# Patient Record
Sex: Female | Born: 1935 | Race: White | Hispanic: No | Smoking: Never smoker
Health system: Southern US, Community
[De-identification: ages and names within clinical notes are randomized; demographics above are authoritative.]

## PROBLEM LIST (undated history)

## (undated) DIAGNOSIS — S82891A Other fracture of right lower leg, initial encounter for closed fracture: Secondary | ICD-10-CM

## (undated) DIAGNOSIS — G20 Parkinson's disease: Secondary | ICD-10-CM

## (undated) DIAGNOSIS — G20A1 Parkinson's disease without dyskinesia, without mention of fluctuations: Secondary | ICD-10-CM

## (undated) DIAGNOSIS — E538 Deficiency of other specified B group vitamins: Secondary | ICD-10-CM

## (undated) DIAGNOSIS — M545 Low back pain, unspecified: Secondary | ICD-10-CM

## (undated) DIAGNOSIS — I10 Essential (primary) hypertension: Secondary | ICD-10-CM

## (undated) DIAGNOSIS — I4891 Unspecified atrial fibrillation: Secondary | ICD-10-CM

## (undated) DIAGNOSIS — E119 Type 2 diabetes mellitus without complications: Secondary | ICD-10-CM

## (undated) DIAGNOSIS — E785 Hyperlipidemia, unspecified: Secondary | ICD-10-CM

## (undated) DIAGNOSIS — L03115 Cellulitis of right lower limb: Secondary | ICD-10-CM

## (undated) HISTORY — DX: Essential (primary) hypertension: I10

## (undated) HISTORY — DX: Parkinson's disease without dyskinesia, without mention of fluctuations: G20.A1

## (undated) HISTORY — DX: Low back pain: M54.5

## (undated) HISTORY — DX: Cellulitis of right lower limb: L03.115

## (undated) HISTORY — DX: Unspecified atrial fibrillation: I48.91

## (undated) HISTORY — DX: Other fracture of right lower leg, initial encounter for closed fracture: S82.891A

## (undated) HISTORY — DX: Hyperlipidemia, unspecified: E78.5

## (undated) HISTORY — DX: Low back pain, unspecified: M54.50

## (undated) HISTORY — PX: PACEMAKER INSERTION: SHX728

## (undated) HISTORY — DX: Deficiency of other specified B group vitamins: E53.8

## (undated) HISTORY — DX: Parkinson's disease: G20

## (undated) HISTORY — DX: Morbid (severe) obesity due to excess calories: E66.01

## (undated) HISTORY — DX: Type 2 diabetes mellitus without complications: E11.9

---

## 1970-09-12 DIAGNOSIS — S82891A Other fracture of right lower leg, initial encounter for closed fracture: Secondary | ICD-10-CM

## 1970-09-12 DIAGNOSIS — E119 Type 2 diabetes mellitus without complications: Secondary | ICD-10-CM

## 1970-09-12 HISTORY — DX: Type 2 diabetes mellitus without complications: E11.9

## 1970-09-12 HISTORY — DX: Other fracture of right lower leg, initial encounter for closed fracture: S82.891A

## 1983-09-13 HISTORY — PX: CHOLECYSTECTOMY: SHX55

## 1998-04-10 ENCOUNTER — Other Ambulatory Visit: Admission: RE | Admit: 1998-04-10 | Discharge: 1998-04-10 | Payer: Self-pay | Admitting: Cardiology

## 2002-01-15 ENCOUNTER — Encounter: Admission: RE | Admit: 2002-01-15 | Discharge: 2002-01-15 | Payer: Self-pay | Admitting: Internal Medicine

## 2002-01-15 ENCOUNTER — Encounter: Payer: Self-pay | Admitting: Internal Medicine

## 2004-02-02 ENCOUNTER — Encounter: Admission: RE | Admit: 2004-02-02 | Discharge: 2004-02-02 | Payer: Self-pay | Admitting: Internal Medicine

## 2004-07-29 ENCOUNTER — Encounter: Admission: RE | Admit: 2004-07-29 | Discharge: 2004-07-29 | Payer: Self-pay | Admitting: Internal Medicine

## 2006-04-25 ENCOUNTER — Encounter: Admission: RE | Admit: 2006-04-25 | Discharge: 2006-04-25 | Payer: Self-pay | Admitting: Internal Medicine

## 2007-06-30 ENCOUNTER — Inpatient Hospital Stay (HOSPITAL_COMMUNITY): Admission: EM | Admit: 2007-06-30 | Discharge: 2007-07-02 | Payer: Self-pay | Admitting: Emergency Medicine

## 2008-03-19 ENCOUNTER — Encounter: Admission: RE | Admit: 2008-03-19 | Discharge: 2008-03-19 | Payer: Self-pay | Admitting: Internal Medicine

## 2009-03-27 ENCOUNTER — Encounter: Admission: RE | Admit: 2009-03-27 | Discharge: 2009-03-27 | Payer: Self-pay | Admitting: Internal Medicine

## 2009-07-31 ENCOUNTER — Inpatient Hospital Stay (HOSPITAL_COMMUNITY): Admission: EM | Admit: 2009-07-31 | Discharge: 2009-08-04 | Payer: Self-pay | Admitting: Emergency Medicine

## 2010-06-26 ENCOUNTER — Encounter: Payer: Self-pay | Admitting: Internal Medicine

## 2010-08-31 DIAGNOSIS — N189 Chronic kidney disease, unspecified: Secondary | ICD-10-CM

## 2010-08-31 DIAGNOSIS — I4891 Unspecified atrial fibrillation: Secondary | ICD-10-CM | POA: Insufficient documentation

## 2010-08-31 DIAGNOSIS — E119 Type 2 diabetes mellitus without complications: Secondary | ICD-10-CM

## 2010-08-31 DIAGNOSIS — I10 Essential (primary) hypertension: Secondary | ICD-10-CM

## 2010-08-31 DIAGNOSIS — E782 Mixed hyperlipidemia: Secondary | ICD-10-CM | POA: Insufficient documentation

## 2010-09-01 ENCOUNTER — Ambulatory Visit: Payer: Self-pay | Admitting: Internal Medicine

## 2010-09-01 DIAGNOSIS — Z950 Presence of cardiac pacemaker: Secondary | ICD-10-CM

## 2010-09-01 DIAGNOSIS — I4892 Unspecified atrial flutter: Secondary | ICD-10-CM | POA: Insufficient documentation

## 2010-09-01 DIAGNOSIS — I5032 Chronic diastolic (congestive) heart failure: Secondary | ICD-10-CM

## 2010-10-12 NOTE — Miscellaneous (Signed)
Summary: Device preload  Clinical Lists Changes  Observations: Added new observation of PPM INDICATN: A-fib  (06/26/2010 15:14) Added new observation of MAGNET RTE: BOL 85 ERI 65 (06/26/2010 15:14) Added new observation of PPMLEADSTAT2: active (06/26/2010 15:14) Added new observation of PPMLEADSER2: [id number] (06/26/2010 15:14) Added new observation of PPMLEADMOD2: [id number]  (06/26/2010 15:14) Added new observation of PPMLEADDOI2: 07/31/2009  (06/26/2010 15:14) Added new observation of PPMLEADLOC2: RV  (06/26/2010 15:14) Added new observation of PPMLEADSTAT1: active  (06/26/2010 15:14) Added new observation of PPMLEADSER1: [id number]  (06/26/2010 15:14) Added new observation of PPMLEADMOD1: 5076  (06/26/2010 15:14) Added new observation of PPMLEADDOI1: 07/31/2009  (06/26/2010 15:14) Added new observation of PPMLEADLOC1: RA  (06/26/2010 15:14) Added new observation of PPM IMP MD: Duffy Rhody Tennant,MD  (06/26/2010 15:14) Added new observation of PPM DOI: 07/31/2009  (06/26/2010 15:14) Added new observation of PPM SERL#: [id number]  (06/26/2010 15:14) Added new observation of PPM MODL#: [id number]  (06/26/2010 32:44) Added new observation of PACEMAKERMFG: Medtronic  (06/26/2010 15:14) Added new observation of PPM REFER MD: Chip Boer  (06/26/2010 15:14) Added new observation of PACEMAKER MD: Lewayne Bunting, MD  (06/26/2010 15:14)      PPM Specifications Following MD:  Lewayne Bunting, MD     Referring MD:  Chip Boer PPM Vendor:  Medtronic     PPM Model Number:  ADDRL1     PPM Serial Number:  [id number] PPM DOI:  07/31/2009     PPM Implanting MD:  Rolla Plate  Lead 1    Location: RA     DOI: 07/31/2009     Model #: [id number]     Serial #: [id number]     Status: active Lead 2    Location: RV     DOI: 07/31/2009     Model #: [id number]     Serial #: [id number]     Status: active  Magnet Response Rate:  BOL 85 ERI 65  Indications:  A-fib

## 2010-10-14 NOTE — Cardiovascular Report (Signed)
Summary: Office Visit   Office Visit   Imported By: Roderic Ovens 09/08/2010 11:22:44  _____________________________________________________________________  External Attachment:    Type:   Image     Comment:   External Document

## 2010-10-14 NOTE — Assessment & Plan Note (Signed)
Summary: pacer check/medtronic   Visit Type:  Initial Consult   History of Present Illness: Leah [name] is referred today for ongoing PPM evaluation and treatment along with treatment of recurrent atrial arrhythmias.  She is a pleasant obese woman with a h/o HTN, DM, tachy-brady syndrome and morbid obesity.  She is s/p PPM just over a year ago.  She denies c/p  but does occaisionally experience sob described as a smothering sensation.  She has minimal palpitations. No syncope. Minimal peripheral edema.   Current Medications (verified): 1)  Atenolol 25 Mg Tabs (Atenolol) .... Take 3  Tablets By Mouth Daily 2)  Hydrochlorothiazide 25 Mg Tabs (Hydrochlorothiazide) .... Take One Tablet By Mouth Daily. 3)  Glimepiride 4 Mg Tabs (Glimepiride) .... Once Daily 4)  Metformin Hcl 1000 Mg Tabs (Metformin Hcl) .... 2 Tabs Daily 5)  Hydrocodone-Acetaminophen 5-500 Mg Tabs (Hydrocodone-Acetaminophen) .... Uad 6)  Lipitor 10 Mg Tabs (Atorvastatin Calcium) .... Take One Tablet By Mouth Daily. 7)  Warfarin Sodium 5 Mg Tabs (Warfarin Sodium) .... Use As Directed By Anticoagulation Clinic 8)  Cephalexin 250 Mg Caps (Cephalexin) .... Once Daily 9)  Lisinopril 10 Mg Tabs (Lisinopril) .... Take One Tablet By Mouth Daily 10)  Digoxin 0.125 Mg Tabs (Digoxin) .... Take A Half  Tablet By Mouth Daily 11)  Fish Oil   Oil (Fish Oil) .... Uad 12)  Vitamin B-12   Tabs (Cyanocobalamin) .... Daily 13)  Vitamin D 1000 Unit  Tabs (Cholecalciferol) .... Daily 14)  Lantus 100 Unit/ml Soln (Insulin Glargine) .... Uad  Past History:  Past Medical History: Last updated: 08/31/2010  1. Atrial fibrillation with rapid ventricular response as well as       severe pauses with asystole lasting 4 seconds.  This required       implantation of a permanent pacemaker this admission.   2. Chronic anticoagulation.   3. Acute on chronic renal insufficiency.   4. Type 2 diabetes.   5. Obesity.   6. Hypertension.   7.  Hyperlipidemia.   8. Osteoporosis.   9. She did have a urinary tract infection as well.      Past Surgical History: Last updated: 08/31/2010  Medtronic dual-chamber bipolar pacemaker  Social History: Last updated: 08/31/2010  Notable for her being a widow since 2007.  She lives   alone.  No tobacco.  No alcohol.  No drug use.   Review of Systems       All systems reviewed and negative except as noted in the HPI and some arthritis.  Vital Signs:  Patient profile:   76 year old female Weight:      256 pounds Pulse rate:   72 / minute BP sitting:   124 / 78  (left arm)  Vitals Entered By: Laurance Flatten CMA (September 01, 2010 4:44 PM)  Physical Exam  General:  Obese, well developed, well nourished, in no acute distress.  HEENT: normal Neck: supple. No JVD. Carotids 2+ bilaterally no bruits Cor: Regular tachy with  no rubs, gallops or murmur Lungs: CTA except for minimal basilar rales. Ab: soft, nontender. nondistended. No HSM. Good bowel sounds Ext: warm. no cyanosis or clubbing with minimal peripheral  edema Neuro: alert and oriented. Grossly nonfocal. affect pleasant    PPM Specifications Following MD:  Lewayne Bunting, MD     Referring MD:  Chip Boer PPM Vendor:  Medtronic     PPM Model Number:  ADDRL1     PPM Serial Number:  [id number] PPM DOI:  07/31/2009     PPM Implanting MD:  Rolla Plate  Lead 1    Location: RA     DOI: 07/31/2009     Model #: 1610     Serial #: [id number]     Status: active Lead 2    Location: RV     DOI: 07/31/2009     Model #: 1914     Serial #: [id number]     Status: active  Magnet Response Rate:  BOL 85 ERI 65  Indications:  A-fib   MD Comments:  Underlying atrial rhythm is flutter with an RVR/CVR.  Impression & Recommendations:  Problem # 1:  ATRIAL FLUTTER (ICD-427.32) Today the patient is in atrial flutter.  Her ventricular rate is slightly elevated.  She appears minimally symptomatic.  I have asked her to continue her  current meds and I will discuss additional medical therapy with Dr. Elease Hashimoto. Her updated medication list for this problem includes:    Atenolol 25 Mg Tabs (Atenolol) .Marland Kitchen... Take 3  tablets by mouth daily    Warfarin Sodium 5 Mg Tabs (Warfarin sodium) ..... Use as directed by anticoagulation clinic    Digoxin 0.125 Mg Tabs (Digoxin) .Marland Kitchen... Take a half  tablet by mouth daily  Problem # 2:  ATRIAL FIBRILLATION (ICD-427.31) She has also had some atrial fibrillation.  Will discuss additional medical therapy. I wonder if flecainide would be a consideration. Her updated medication list for this problem includes:    Atenolol 25 Mg Tabs (Atenolol) .Marland Kitchen... Take 3  tablets by mouth daily    Warfarin Sodium 5 Mg Tabs (Warfarin sodium) ..... Use as directed by anticoagulation clinic    Digoxin 0.125 Mg Tabs (Digoxin) .Marland Kitchen... Take a half  tablet by mouth daily  Problem # 3:  CARDIAC PACEMAKER IN SITU (ICD-V45.01) Her device is working normally.  Will follow.  Problem # 4:  CHRONIC DIASTOLIC HEART FAILURE (ICD-428.32) Her symptoms appear to be class 2. I have encouraged her to maintain a low sodium diet and meds as below. Her updated medication list for this problem includes:    Atenolol 25 Mg Tabs (Atenolol) .Marland Kitchen... Take 3  tablets by mouth daily    Hydrochlorothiazide 25 Mg Tabs (Hydrochlorothiazide) .Marland Kitchen... Take one tablet by mouth daily.    Warfarin Sodium 5 Mg Tabs (Warfarin sodium) ..... Use as directed by anticoagulation clinic    Lisinopril 10 Mg Tabs (Lisinopril) .Marland Kitchen... Take one tablet by mouth daily    Digoxin 0.125 Mg Tabs (Digoxin) .Marland Kitchen... Take a half  tablet by mouth daily

## 2010-10-14 NOTE — Procedures (Signed)
Summary: Cardiology Device Clinic   [facility] Specifications Following MD:  Leah Bunting, MD     Referring MD:  Chip Boer PPM Vendor:  Medtronic     PPM Model Number:  ADDRL1     PPM Serial Number:  [id number] PPM DOI:  07/31/2009     PPM Implanting MD:  Rolla Plate  Lead 1    Location: RA     DOI: 07/31/2009     Model #: 4098     Serial #: [id number]     Status: active Lead 2    Location: RV     DOI: 07/31/2009     Model #: 6213     Serial #: [id number]     Status: active  Magnet Response Rate:  BOL 85 ERI 65  Indications:  A-fib    PPM Follow Up Remote Check?  No Battery Voltage:  2.79 V     Battery Est. Longevity:  12 years     Pacer Dependent:  No       PPM Device Measurements Atrium  Amplitude: 5.6 mV, Impedance: 523 ohms,  Right Ventricle  Amplitude: 11.20 mV, Impedance: 678 ohms, Threshold: 0.375 V at 0.4 msec  Episodes MS Episodes:  13463     Percent Mode Switch:  67.4%     Coumadin:  Yes Atrial Pacing:  35.6%     Ventricular Pacing:  67.4%  Parameters Mode:  DDDR+     Lower Rate Limit:  60     Upper Rate Limit:  130 Paced AV Delay:  150     Sensed AV Delay:  120 Next Remote Date:  12/02/2010     Next Cardiology Appt Due:  08/13/2011 Tech Comments:  No parameter changes.  A-flutter with RVR, + coumadin.  [facility] transmissions every 3 months.  ROV 1 year with Dr. Ladona Ridgel. Altha Harm, LPN  September 02, 2010 7:59 AM

## 2010-12-02 ENCOUNTER — Encounter: Payer: Self-pay | Admitting: *Deleted

## 2010-12-03 ENCOUNTER — Encounter: Payer: Self-pay | Admitting: *Deleted

## 2010-12-06 ENCOUNTER — Encounter: Payer: Self-pay | Admitting: Internal Medicine

## 2010-12-15 LAB — BASIC METABOLIC PANEL
BUN: 31 mg/dL — ABNORMAL HIGH (ref 6–23)
BUN: 43 mg/dL — ABNORMAL HIGH (ref 6–23)
BUN: 55 mg/dL — ABNORMAL HIGH (ref 6–23)
CO2: 23 mEq/L (ref 19–32)
CO2: 24 mEq/L (ref 19–32)
Calcium: 10.1 mg/dL (ref 8.4–10.5)
Calcium: 9.1 mg/dL (ref 8.4–10.5)
Calcium: 9.6 mg/dL (ref 8.4–10.5)
Chloride: 103 mEq/L (ref 96–112)
Chloride: 103 mEq/L (ref 96–112)
Chloride: 97 mEq/L (ref 96–112)
Creatinine, Ser: 2.05 mg/dL — ABNORMAL HIGH (ref 0.4–1.2)
GFR calc Af Amer: 29 mL/min — ABNORMAL LOW (ref >60)
GFR calc Af Amer: 56 mL/min — ABNORMAL LOW (ref >60)
GFR calc non Af Amer: 20 mL/min — ABNORMAL LOW (ref >60)
GFR calc non Af Amer: 20 mL/min — ABNORMAL LOW (ref >60)
GFR calc non Af Amer: 24 mL/min — ABNORMAL LOW (ref >60)
GFR calc non Af Amer: 42 mL/min — ABNORMAL LOW (ref >60)
Glucose, Bld: 132 mg/dL — ABNORMAL HIGH (ref 70–99)
Potassium: 3.7 mEq/L (ref 3.5–5.1)
Potassium: 4 mEq/L (ref 3.5–5.1)
Potassium: 4 mEq/L (ref 3.5–5.1)
Potassium: 4.1 mEq/L (ref 3.5–5.1)
Sodium: 131 mEq/L — ABNORMAL LOW (ref 135–145)
Sodium: 133 mEq/L — ABNORMAL LOW (ref 135–145)
Sodium: 137 mEq/L (ref 135–145)
Sodium: 138 mEq/L (ref 135–145)
Sodium: 140 mEq/L (ref 135–145)

## 2010-12-15 LAB — GLUCOSE, CAPILLARY
Glucose-Capillary: 131 mg/dL — ABNORMAL HIGH (ref 70–99)
Glucose-Capillary: 134 mg/dL — ABNORMAL HIGH (ref 70–99)
Glucose-Capillary: 144 mg/dL — ABNORMAL HIGH (ref 70–99)
Glucose-Capillary: 145 mg/dL — ABNORMAL HIGH (ref 70–99)
Glucose-Capillary: 149 mg/dL — ABNORMAL HIGH (ref 70–99)
Glucose-Capillary: 169 mg/dL — ABNORMAL HIGH (ref 70–99)
Glucose-Capillary: 176 mg/dL — ABNORMAL HIGH (ref 70–99)
Glucose-Capillary: 199 mg/dL — ABNORMAL HIGH (ref 70–99)
Glucose-Capillary: 206 mg/dL — ABNORMAL HIGH (ref 70–99)
Glucose-Capillary: 224 mg/dL — ABNORMAL HIGH (ref 70–99)
Glucose-Capillary: 232 mg/dL — ABNORMAL HIGH (ref 70–99)
Glucose-Capillary: 273 mg/dL — ABNORMAL HIGH (ref 70–99)

## 2010-12-15 LAB — PHOSPHORUS: Phosphorus: 2.9 mg/dL (ref 2.3–4.6)

## 2010-12-15 LAB — MAGNESIUM: Magnesium: 1.7 mg/dL (ref 1.5–2.5)

## 2010-12-15 LAB — PROTIME-INR
INR: 1.8 — ABNORMAL HIGH (ref 0.00–1.49)
INR: 1.92 — ABNORMAL HIGH (ref 0.00–1.49)
INR: 2.02 — ABNORMAL HIGH (ref 0.00–1.49)
Prothrombin Time: 20.7 seconds — ABNORMAL HIGH (ref 11.6–15.2)

## 2010-12-15 LAB — HEMOGLOBIN A1C: Mean Plasma Glucose: 146 mg/dL

## 2010-12-15 LAB — CBC
HCT: 45.4 % (ref 36.0–46.0)
Hemoglobin: 13.5 g/dL (ref 12.0–15.0)
Hemoglobin: 15.1 g/dL — ABNORMAL HIGH (ref 12.0–15.0)
Hemoglobin: 15.4 g/dL — ABNORMAL HIGH (ref 12.0–15.0)
MCHC: 33.4 g/dL (ref 30.0–36.0)
MCHC: 33.8 g/dL (ref 30.0–36.0)
MCV: 89.9 fL (ref 78.0–100.0)
MCV: 90.4 fL (ref 78.0–100.0)
Platelets: 273 10*3/uL (ref 150–400)
Platelets: 295 10*3/uL (ref 150–400)
RBC: 4.43 MIL/uL (ref 3.87–5.11)
RBC: 5.13 MIL/uL — ABNORMAL HIGH (ref 3.87–5.11)
WBC: 10.1 10*3/uL (ref 4.0–10.5)
WBC: 11 10*3/uL — ABNORMAL HIGH (ref 4.0–10.5)

## 2010-12-15 LAB — CK TOTAL AND CKMB (NOT AT ARMC)
CK, MB: 1.6 ng/mL (ref 0.3–4.0)
Relative Index: INVALID (ref 0.0–2.5)
Total CK: 63 U/L (ref 7–177)

## 2010-12-15 LAB — COMPREHENSIVE METABOLIC PANEL
AST: 25 U/L (ref 0–37)
Albumin: 3.5 g/dL (ref 3.5–5.2)
Calcium: 9.3 mg/dL (ref 8.4–10.5)
Creatinine, Ser: 2.57 mg/dL — ABNORMAL HIGH (ref 0.4–1.2)
GFR calc Af Amer: 22 mL/min — ABNORMAL LOW (ref >60)
GFR calc non Af Amer: 18 mL/min — ABNORMAL LOW (ref >60)

## 2010-12-15 LAB — URINALYSIS, ROUTINE W REFLEX MICROSCOPIC
Bilirubin Urine: NEGATIVE
Nitrite: NEGATIVE
Specific Gravity, Urine: 1.017 (ref 1.005–1.030)
pH: 7 (ref 5.0–8.0)

## 2010-12-15 LAB — DIFFERENTIAL
Basophils Absolute: 0 10*3/uL (ref 0.0–0.1)
Basophils Relative: 0 % (ref 0–1)
Basophils Relative: 1 % (ref 0–1)
Lymphocytes Relative: 19 % (ref 12–46)
Lymphocytes Relative: 27 % (ref 12–46)
Monocytes Absolute: 1 10*3/uL (ref 0.1–1.0)
Monocytes Absolute: 1 10*3/uL (ref 0.1–1.0)
Monocytes Relative: 8 % (ref 3–12)
Monocytes Relative: 9 % (ref 3–12)
Neutro Abs: 6.5 10*3/uL (ref 1.7–7.7)
Neutro Abs: 9.5 10*3/uL — ABNORMAL HIGH (ref 1.7–7.7)
Neutrophils Relative %: 59 % (ref 43–77)
Neutrophils Relative %: 72 % (ref 43–77)

## 2010-12-15 LAB — CARDIAC PANEL(CRET KIN+CKTOT+MB+TROPI)
CK, MB: 1.7 ng/mL (ref 0.3–4.0)
CK, MB: 2.4 ng/mL (ref 0.3–4.0)
Relative Index: INVALID (ref 0.0–2.5)
Relative Index: INVALID (ref 0.0–2.5)
Total CK: 67 U/L (ref 7–177)
Total CK: 71 U/L (ref 7–177)
Troponin I: 0.05 ng/mL (ref 0.00–0.06)

## 2010-12-15 LAB — URINE CULTURE

## 2010-12-15 LAB — URINE MICROSCOPIC-ADD ON

## 2010-12-15 LAB — BRAIN NATRIURETIC PEPTIDE: Pro B Natriuretic peptide (BNP): 313 pg/mL — ABNORMAL HIGH (ref 0.0–100.0)

## 2010-12-15 LAB — TROPONIN I: Troponin I: 0.03 ng/mL (ref 0.00–0.06)

## 2011-01-25 NOTE — H&P (Signed)
Leah [name], [name]            ACCOUNT NO.:  [id number]   MEDICAL RECORD NO.:  [id number]          PATIENT TYPE:  EMS   LOCATION:  [facility]JO                         FACILITY:  [facility]   PHYSICIAN:  Tera Mater. [place], M.D. DATE OF BIRTH:  10/30/1934   DATE OF ADMISSION:  06/29/2007  DATE OF DISCHARGE:                              HISTORY & PHYSICAL   HISTORY:  Leah [name] is a 76 year old white female with a history of  long-standing diabetes, hypertension, atrial fibrillation,  hyperlipidemia who started with foot pain 2 days ago.  It was quite  severe and hurt to put her foot down.  It was a bit less yesterday but  she did not seek any care. Today her daughter looked at it and found a  large ulcer underneath it.  The patient is having less pain today so she  was less worried about.  They took her to an Urgent Care at [facility], who immediately sent her over to the emergency room or  evaluation and admission.  Apparently she has had a cellulitic process  that has been building for several days.  The patient is unaware of  this.  She states she always wears shoes.  She has had no new footwear  or no extended periods of walking or travel.  The swelling in her foot  has been worse but she attributed that just to new shoes.  She has had  no fevers, chills or sweats.  Her bowels have been working well.  Blood  sugar has been as low as 118, highest 147, no lows with symptoms have  been noted.  She had no cardiac symptoms.  She does not sensation of  palpitations.  No shortness of breath.  Her bowels have been okay,  weight has been stable.  Last A1c was in the mid-7s.  Her vision has  been relatively stable.   PAST MEDICAL HISTORY:  1. Includes type 2 diabetes, dating back to 37.  2. She has had retinopathy in the past.  3. She has hypertension.  4. Hyperlipidemia.  5. She has previously had atrial fibrillation but has been in sinus      rhythm recently but left on Coumadin due  to recurrence.  6. She had a right ankle fracture with surgery in 1972.  7. She has had her gallbladder out.  She cannot tell me about any other medical history.   SOCIAL HISTORY:  She is not currently a smoker.   MEDICATIONS:  1. Amaryl 4.  2. Glucophage 1000 twice a day.  3. Coumadin 5 mg 3 days a week, 7.5 mg 4 days a week.  4. Lanoxin 0.25 daily.  5. Vicodin p.r.n.  6. Lipitor 10.  7. Lisinopril 40.  8. HCTZ 25.  9. Multivitamin.   ALLERGIES:  LOMOTIL.   PHYSICAL EXAMINATION:  VITAL SIGNS:  Blood pressure 150/75, pulse 100  and irregular, respirations 22, temperature 97.5.  GENERAL:  We have an obese chronically ill-appearing white female  sitting up in no distress.  HEENT:  Sclerae anicteric.  Extraocular movements are intact.  There is  no nystagmus.  Oral mucous membranes appear moist.  There is extensive  scarring of the left side of her face.  NECK:  Supple.  I cannot appreciate any bruits or JVD.  LUNGS:  Clear without wheezing, rales or rhonchi.  No accessory muscles  are in use.  HEART:  Irregularly irregular with a systolic murmur.  ABDOMEN:  Soft, nontender, obese with no masses.  EXTREMITIES:  Reveal slight diminished dorsalis pedis and posterior  tibial pulse on the right side.  She has extensive redness on the dorsal  part of the right foot extending up to the midfoot, basically centered  around the first and second metatarsals.  Between the toes there seems  to be a bit of a penetrating and on the plantar surface there is an  ulcer area that is about 1.5 cm that is unroofed with a sharp scalpel.  There is also a blister that extends up between the toes.  This is  unroofed showing a beefy red bit of tissue.  There is no evidence of  gray or brown tissue.  No pus is expressed but there is extensive  swelling and there could be pus tracking between the interdigital spaces  between the first and second metatarsals.  There is some swelling of the  leg.  There  are some venostasis changes that are chronic.  NEUROLOGIC:  The patient is awake, alert, mentation is good. Insight is  poor.  No resting tremors present.   LABORATORY DATA:  UA is negative.  White count is 13,900, hemoglobin  13.6, platelets 303,000. INR 2.5.  Sodium 137, potassium 4, chloride  100, CO2 24, BUN 25, creatinine 1.25, glucose 220, SGOT 24.  Cardiogram  is atrial fibrillation at a rate of about 110.   SUMMARY:  We have a 76 year old white female with several days of a deep  foot infection with ascending cellulitis.  She has some compromise of  circulation with some edema.  I unroofed the area and could not really  express any pus, so it may not have consolidated yet.  Her white count  is up and she is clinically a bit toxic.  There is no evidence, however,  of sepsis.  This could well represent an abscess between the first and  second metatarsal bones. We are going to give her antibiotics, fluids,  bed rest and see how this looks tomorrow. I will decide whether to get  an orthopedic or general surgery consult at that time.  The fact that  circulation is somewhat compromises does make this a higher risk lesion.  There was really no bleeding when I did the debridement at the bedside.  We are going to give her IV Unasyn and bed rest.  We are going to x-ray  to rule out osteo which has been done but not read yet.  We will  consider a bone scan and/or MRI or other imaging depending on its  clinical status.  She also is back in atrial fibrillation but INR is  therapeutic and she is on approximately rate control medications.  We  are going to continue those for the present.  We may get her  cardiologist involved.  We are going to hold her or diabetic agents  especially her Glucophage and put her on Lantus and sliding scale  NovoLog for better control.  Lipid agents will be continued.           ______________________________  Tera Mater Evlyn Kanner, M.D.     SAS/MEDQ  D:  06/29/2007  T:  07/01/2007  Job:  [id number]

## 2011-01-28 NOTE — Discharge Summary (Signed)
NAMEBRANNA, CORTINA            ACCOUNT NO.:  [id number]   MEDICAL RECORD NO.:  [id number]          PATIENT TYPE:  INP   LOCATION:  5705                         FACILITY:  [facility]   PHYSICIAN:  Mark A. Perini, M.D.   DATE OF BIRTH:  [birth year]   DATE OF ADMISSION:  06/29/2007  DATE OF DISCHARGE:  07/02/2007                               DISCHARGE SUMMARY   DISCHARGE DIAGNOSES:  1. Right foot cellulitis.  2. Right foot diabetic foot ulcer.  3. Type 2 diabetes, uncontrolled.  4. Hypertension.  5. Hyperlipidemia.  6. Chronic atrial fibrillation.   PROCEDURES:  Dr. Evlyn Kanner did a bedside debridement of her right foot.   DISCHARGE MEDICATIONS:  1. Augmentin 875 mg twice daily with food.  2. Amaryl 4 mg daily.  3. Metformin 1000 mg twice daily.  4. Lanoxin 0.25 mg every other day.  5. Lipitor 10 mg daily.  6. Lisinopril 40 mg daily.  7. HCTZ 25 mg daily.  8. Centrum Silver daily.  9. Atenolol 25 mg daily.  10.Fish oil supplement.  11.Fosamax 70 mg weekly.  12.Fenofibrate 200 mg daily with food.  13.Macrobid 100 mg daily.  14.Coumadin 5 mg daily except 7.5 mg each Tuesday.  15.She is to start Lantus SoloStar pen 5 units subcutaneous each      evening which is a new medication for her.   HISTORY OF PRESENT ILLNESS:  Ms. Frein is a 76 year old female with  longstanding type 2 diabetes.  She noted a 2-3 days history of pain in  her right foot.  She eventually presented to Urgent Care who sent her to  the emergency room.  At that time, she was noted to have an extensive  redness on the dorsal aspect of the right foot extending to the mid foot  centered around the first and second metatarsals.  Between the toes,  there was some penetration on the plantar surface and an ulcer that is  1.5 cm.  It was unroofed with a scalpel in the emergency room.  There  was no definite pus expressed; however, there was some swelling around  the ulcer and in the interdigital spaces between the  first and second  metatarsals.  She was therefore admitted for further care.   HOSPITAL COURSE:  Ms. Ahart was admitted and she was placed on IV  Unasyn which she tolerated well.  She had dressing changes to her right  foot.  She had gradual improvement in her cellulitic changes.  There is  no fluctuance noted, and the base of the ulcer appeared clean and dry.  Therefore, on July 02, 2007, she was deemed stable for discharge home  with continued wound care and oral antibiotics.   DISCHARGE PHYSICAL EXAMINATION:  VITAL SIGNS:  Temperature 98, afebrile,  pulse 55, respiratory rate 20, blood pressure 126/72.  Blood sugars  ranged from 141 to 177.  GENERAL:  She was in no acute distress, alert and oriented.  LUNGS:  Clear to auscultation bilaterally with no wheezes, rales or  rhonchi.  HEART:  Irregularly irregular with no significant murmur.  ABDOMEN:  Obese but soft and nontender.  Her right foot showed dry base  to a shallow ulcer on the plantar aspect of the distal right foot.  There was only minimal redness of the forefoot.   DISCHARGE LABORATORY DATA:  White count 8.6 which was reduced,  hemoglobin 12.5, platelet count 308,000, INR 2.0.  Sodium 139, potassium  3.6, chloride 104, CO2 of 25, BUN 19, creatinine 1.17, glucose 139, GFR  estimated at 46, calcium 8.6.  Blood cultures x2 were negative at the  time of discharge.  Hemoglobin A1c was 8.1.   DISCHARGE INSTRUCTIONS:  Anieya Helman is to change her dressing twice a  day.  She is to follow-up in 1 week in our office for a wound check.  She is to see Lynden Ang in our Coumadin clinic in 2-3 days to recheck her  INR.  Furthermore, she will have a home health R.N. to help with wound  care and wound follow up, as well.  We are to be called if there are any  recurrent problems.      Mark A. Perini, M.D.  Electronically Signed     MAP/MEDQ  D:  07/03/2007  T:  07/03/2007  Job:  [id number]

## 2011-06-22 LAB — CBC
HCT: 38.6
HCT: 39.8
HCT: 41.6
HCT: 42.1
Hemoglobin: 12.5
Hemoglobin: 13
Hemoglobin: 13.6
Hemoglobin: 13.9
MCHC: 32.4
MCHC: 33.3
Platelets: 303
Platelets: 303
RBC: 4.62
RBC: 4.64
RDW: 14.5 — ABNORMAL HIGH
RDW: 15 — ABNORMAL HIGH
WBC: 10.3
WBC: 12 — ABNORMAL HIGH
WBC: 13.9 — ABNORMAL HIGH

## 2011-06-22 LAB — DIFFERENTIAL
Basophils Absolute: 0.1
Basophils Relative: 0
Eosinophils Absolute: 0.1
Monocytes Absolute: 1.2 — ABNORMAL HIGH
Monocytes Relative: 8

## 2011-06-22 LAB — COMPREHENSIVE METABOLIC PANEL
ALT: 22
Albumin: 3.5
Alkaline Phosphatase: 27 — ABNORMAL LOW
BUN: 25 — ABNORMAL HIGH
Chloride: 102
Glucose, Bld: 220 — ABNORMAL HIGH
Potassium: 4
Sodium: 137
Total Bilirubin: 0.7
Total Protein: 7

## 2011-06-22 LAB — URINE MICROSCOPIC-ADD ON

## 2011-06-22 LAB — SEDIMENTATION RATE
Sed Rate: 26 — ABNORMAL HIGH
Sed Rate: 30 — ABNORMAL HIGH

## 2011-06-22 LAB — URINALYSIS, ROUTINE W REFLEX MICROSCOPIC
Bilirubin Urine: NEGATIVE
Glucose, UA: NEGATIVE
Hgb urine dipstick: NEGATIVE
Ketones, ur: NEGATIVE
Specific Gravity, Urine: 1.015
pH: 6.5

## 2011-06-22 LAB — BASIC METABOLIC PANEL
BUN: 23
CO2: 25
CO2: 25
Chloride: 100
Chloride: 104
GFR calc Af Amer: 54 — ABNORMAL LOW
GFR calc Af Amer: 56 — ABNORMAL LOW
GFR calc non Af Amer: 44 — ABNORMAL LOW
Glucose, Bld: 139 — ABNORMAL HIGH
Potassium: 3.6
Potassium: 3.9
Sodium: 134 — ABNORMAL LOW
Sodium: 138
Sodium: 139

## 2011-06-22 LAB — CULTURE, BLOOD (ROUTINE X 2)

## 2011-06-22 LAB — HEMOGLOBIN A1C
Hgb A1c MFr Bld: 8.1 — ABNORMAL HIGH
Mean Plasma Glucose: 211

## 2011-06-22 LAB — PROTIME-INR
INR: 2.1 — ABNORMAL HIGH
Prothrombin Time: 23.4 — ABNORMAL HIGH

## 2011-06-30 ENCOUNTER — Other Ambulatory Visit: Payer: Self-pay | Admitting: Diagnostic Neuroimaging

## 2011-06-30 DIAGNOSIS — G20C Parkinsonism, unspecified: Secondary | ICD-10-CM

## 2011-06-30 DIAGNOSIS — G20 Parkinson's disease: Secondary | ICD-10-CM

## 2011-07-04 ENCOUNTER — Ambulatory Visit
Admission: RE | Admit: 2011-07-04 | Discharge: 2011-07-04 | Disposition: A | Discharge status: Home / Self Care | Payer: Medicare Other | Source: Ambulatory Visit | Attending: Diagnostic Neuroimaging | Admitting: Diagnostic Neuroimaging

## 2011-07-04 DIAGNOSIS — G20 Parkinson's disease: Secondary | ICD-10-CM

## 2011-07-04 DIAGNOSIS — G20C Parkinsonism, unspecified: Secondary | ICD-10-CM

## 2011-09-29 ENCOUNTER — Encounter: Payer: Self-pay | Admitting: Internal Medicine

## 2011-10-20 ENCOUNTER — Encounter: Payer: Self-pay | Admitting: *Deleted

## 2011-11-29 ENCOUNTER — Encounter: Payer: Self-pay | Admitting: Internal Medicine

## 2011-11-29 ENCOUNTER — Ambulatory Visit (INDEPENDENT_AMBULATORY_CARE_PROVIDER_SITE_OTHER): Payer: Medicare Other | Admitting: Internal Medicine

## 2011-11-29 VITALS — BP 145/80 | HR 50 | Ht 64.0 in | Wt 242.0 lb

## 2011-11-29 DIAGNOSIS — Z950 Presence of cardiac pacemaker: Secondary | ICD-10-CM

## 2011-11-29 DIAGNOSIS — I4892 Unspecified atrial flutter: Secondary | ICD-10-CM

## 2011-11-29 DIAGNOSIS — I509 Heart failure, unspecified: Secondary | ICD-10-CM

## 2011-11-29 DIAGNOSIS — I5032 Chronic diastolic (congestive) heart failure: Secondary | ICD-10-CM

## 2011-11-29 DIAGNOSIS — I4891 Unspecified atrial fibrillation: Secondary | ICD-10-CM

## 2011-11-29 LAB — PACEMAKER DEVICE OBSERVATION
AL IMPEDENCE PM: 493 Ohm
ATRIAL PACING PM: 90
RV LEAD AMPLITUDE: 11.2 mv
RV LEAD IMPEDENCE PM: 581 Ohm
VENTRICULAR PACING PM: 13

## 2011-11-29 NOTE — Assessment & Plan Note (Signed)
Her heart failure symptoms are class II. She is instructed to continue her current medical therapy and maintain a low-sodium diet.

## 2011-11-29 NOTE — Patient Instructions (Signed)
Your physician wants you to follow-up in: 6 months in the device clinic and 12 months with Dr Taylor You will receive a reminder letter in the mail two months in advance. If you don't receive a letter, please call our office to schedule the follow-up appointment.  

## 2011-11-29 NOTE — Assessment & Plan Note (Signed)
She is maintaining sinus rhythm. She will continue her current medical therapy. 

## 2011-11-29 NOTE — Assessment & Plan Note (Signed)
Her pacemaker is working normally. She will continue her current followup.

## 2011-11-29 NOTE — Progress Notes (Signed)
HPI Leah [name] returns today for followup. She is a very pleasant 76 year old woman with a history of symptomatic bradycardia, dyslipidemia, hypertension, and recent diagnosis of Parkinson's disease. She denies chest pain or shortness of breath. She has mild peripheral edema. No syncope. She has unsteadiness on her feet when she walks and uses a walker. Allergies  Allergen Reactions  . Lomotil   . Penicillins      Current Outpatient Prescriptions  Medication Sig Dispense Refill  . alendronate (FOSAMAX) 70 MG tablet Take 70 mg by mouth every 7 (seven) days. Take with a full glass of water on an empty stomach.      Marland Kitchen atenolol (TENORMIN) 25 MG tablet Take 25 mg by mouth daily. Three tabs a day      . atorvastatin (LIPITOR) 20 MG tablet Take 20 mg by mouth daily.      . carbidopa-levodopa (SINEMET) 25-250 MG per tablet Take 1 tablet by mouth 3 (three) times daily.      . cephALEXin (KEFLEX) 250 MG capsule Take 250 mg by mouth 4 (four) times daily.      . Cyanocobalamin (VITAMIN B12 PO) Take 2,000 mg by mouth daily. Two tabs      . digoxin (LANOXIN) 0.25 MG tablet Take 250 mcg by mouth daily.      . fish oil-omega-3 fatty acids 1000 MG capsule Take 2 g by mouth daily.      Marland Kitchen glimepiride (AMARYL) 4 MG tablet Take 4 mg by mouth daily before breakfast.      . hydrochlorothiazide (HYDRODIURIL) 25 MG tablet Take 25 mg by mouth daily.      . insulin glargine (LANTUS) 100 UNIT/ML injection Inject into the skin 2 (two) times daily.      Marland Kitchen L-Methylfolate-B6-B12 (METANX PO) Take by mouth daily.      Marland Kitchen lisinopril (PRINIVIL,ZESTRIL) 40 MG tablet Take 40 mg by mouth daily. 1/2 daily      . metaproterenol (ALUPENT) 10 MG tablet Take 10 mg by mouth 3 (three) times daily.      . metFORMIN (GLUCOPHAGE) 1000 MG tablet Take 1,000 mg by mouth 2 (two) times daily with a meal.      . VITAMIN D, CHOLECALCIFEROL, PO Take by mouth.      . warfarin (COUMADIN) 5 MG tablet Take 5 mg by mouth daily. As precribed          Past Medical History  Diagnosis Date  . Type 2 diabetes mellitus 1972  . Hyperlipidemia   . HTN (hypertension)   . Morbid obesity   . AF (atrial fibrillation)   . Osteoporosis   . Vitamin B12 deficiency   . Low back pain   . Cellulitis of foot, right   . Diabetic retinopathy   . Ankle fracture, right 1972    ROS:   All systems reviewed and negative except as noted in the HPI.   Past Surgical History  Procedure Date  . Cholecystectomy 1985  . Pacemaker insertion     Medtronic     No family history on file.   History   Social History  . Marital Status: Married    Spouse Name: N/A    Number of Children: N/A  . Years of Education: N/A   Occupational History  . Not on file.   Social History Main Topics  . Smoking status: Never Smoker   . Smokeless tobacco: Not on file  . Alcohol Use: No  . Drug Use: No  . Sexually Active:  Not on file   Other Topics Concern  . Not on file   Social History Narrative  . No narrative on file     BP 145/80  Pulse 50  Ht 5\' 4"  (1.626 m)  Wt 109.77 kg (242 lb)  BMI 41.54 kg/m2  Physical Exam:  Chronically ill appearing 76 year old woman, NAD HEENT: Unremarkable Neck:  No JVD, no thyromegally Lungs:  Clear with no wheezes, rales, or rhonchi. HEART:  Regular rate rhythm, no murmurs, no rubs, no clicks Abd:  soft, positive bowel sounds, no organomegally, no rebound, no guarding Ext:  2 plus pulses, no edema, no cyanosis, no clubbing Skin:  No rashes no nodules Neuro:  CN II through XII intact, motor grossly intact   DEVICE  Normal device function.  See PaceArt for details.   Assess/Plan:

## 2012-05-22 ENCOUNTER — Encounter: Payer: Self-pay | Admitting: Cardiovascular Disease

## 2012-05-29 ENCOUNTER — Encounter: Payer: Self-pay | Admitting: *Deleted

## 2012-06-11 ENCOUNTER — Ambulatory Visit (INDEPENDENT_AMBULATORY_CARE_PROVIDER_SITE_OTHER): Payer: Medicare Other | Admitting: *Deleted

## 2012-06-11 DIAGNOSIS — I495 Sick sinus syndrome: Secondary | ICD-10-CM

## 2012-06-11 LAB — PACEMAKER DEVICE OBSERVATION
ATRIAL PACING PM: 97
BAMS-0001: 175 {beats}/min
BATTERY VOLTAGE: 2.79 V
VENTRICULAR PACING PM: 23

## 2012-06-11 NOTE — Progress Notes (Signed)
PPM check 

## 2012-06-27 ENCOUNTER — Encounter: Payer: Self-pay | Admitting: Internal Medicine

## 2012-11-19 ENCOUNTER — Encounter: Payer: Self-pay | Admitting: Internal Medicine

## 2012-11-19 ENCOUNTER — Ambulatory Visit (INDEPENDENT_AMBULATORY_CARE_PROVIDER_SITE_OTHER): Payer: Medicare Other | Admitting: Internal Medicine

## 2012-11-19 ENCOUNTER — Encounter: Payer: Self-pay | Admitting: Cardiovascular Disease

## 2012-11-19 VITALS — BP 125/71 | HR 63 | Ht 64.0 in | Wt 233.0 lb

## 2012-11-19 DIAGNOSIS — I10 Essential (primary) hypertension: Secondary | ICD-10-CM

## 2012-11-19 DIAGNOSIS — Z950 Presence of cardiac pacemaker: Secondary | ICD-10-CM

## 2012-11-19 LAB — PACEMAKER DEVICE OBSERVATION
AL THRESHOLD: 0.875 V
BAMS-0001: 175 {beats}/min
BATTERY VOLTAGE: 2.79 V
RV LEAD THRESHOLD: 0.5 V

## 2012-11-19 NOTE — Progress Notes (Signed)
HPI [name] returns today for followup. She is a very pleasant 78 year old woman with a history of symptomatic bradycardia, hypertension, paroxysmal atrial fibrillation, and dyslipidemia. She is status post permanent pacemaker insertion. In the interim, she has done well, and denies palpitations, chest pain, or shortness of breath. No syncope. Allergies  Allergen Reactions  . Diphenoxylate-Atropine   . Penicillins      Current Outpatient Prescriptions  Medication Sig Dispense Refill  . alendronate (FOSAMAX) 70 MG tablet Take 70 mg by mouth every 7 (seven) days. Take with a full glass of water on an empty stomach.      Marland Kitchen atenolol (TENORMIN) 25 MG tablet Take 25 mg by mouth daily. Two in the am and one in the pm      . atorvastatin (LIPITOR) 20 MG tablet Take 20 mg by mouth daily.      . carbidopa-levodopa (SINEMET) 25-250 MG per tablet Take 1 tablet by mouth 3 (three) times daily.      . cephALEXin (KEFLEX) 250 MG capsule Take 250 mg by mouth 4 (four) times daily.      . Cyanocobalamin (VITAMIN B12 PO) Take 2,000 mg by mouth daily. Two tabs      . digoxin (LANOXIN) 0.25 MG tablet Take 250 mcg by mouth daily. 1/2  0.125mg  daily      . fish oil-omega-3 fatty acids 1000 MG capsule Take 2 g by mouth daily.      Marland Kitchen glimepiride (AMARYL) 4 MG tablet Take 4 mg by mouth daily before breakfast.      . hydrochlorothiazide (HYDRODIURIL) 25 MG tablet Take 25 mg by mouth daily.      . insulin glargine (LANTUS) 100 UNIT/ML injection Inject 20 Units into the skin 2 (two) times daily.       Marland Kitchen L-Methylfolate-B6-B12 (METANX PO) Take by mouth daily.      Marland Kitchen lisinopril (PRINIVIL,ZESTRIL) 40 MG tablet Take 40 mg by mouth daily. 1/2 daily      . metaproterenol (ALUPENT) 10 MG tablet Take 10 mg by mouth 3 (three) times daily.      . metFORMIN (GLUCOPHAGE) 1000 MG tablet Take 1,000 mg by mouth 2 (two) times daily with a meal.      . VITAMIN D, CHOLECALCIFEROL, PO Take by mouth.      . warfarin (COUMADIN) 5 MG  tablet Take 5 mg by mouth daily. As precribed       No current facility-administered medications for this visit.     Past Medical History  Diagnosis Date  . Type 2 diabetes mellitus 1972  . Hyperlipidemia   . HTN (hypertension)   . Morbid obesity   . AF (atrial fibrillation)   . Osteoporosis   . Vitamin B12 deficiency   . Low back pain   . Cellulitis of foot, right   . Diabetic retinopathy(362.0)   . Ankle fracture, right 1972    ROS:   All systems reviewed and negative except as noted in the HPI.   Past Surgical History  Procedure Laterality Date  . Cholecystectomy  1985  . Pacemaker insertion      Medtronic     No family history on file.   History   Social History  . Marital Status: Married    Spouse Name: N/A    Number of Children: N/A  . Years of Education: N/A   Occupational History  . Not on file.   Social History Main Topics  . Smoking status: Never Smoker   .  Smokeless tobacco: Not on file  . Alcohol Use: No  . Drug Use: No  . Sexually Active: Not on file   Other Topics Concern  . Not on file   Social History Narrative  . No narrative on file     BP 125/71  Pulse 63  Ht 5\' 4"  (1.626 m)  Wt 233 lb (105.688 kg)  BMI 39.97 kg/m2  SpO2 98%  Physical Exam:  Well appearing 78 year old woman,NAD HEENT: Unremarkable Neck:  7 cm JVD, no thyromegally Lungs:  Clear with no wheezes, rales, or rhonchi. HEART:  Regular rate rhythm, no murmurs, no rubs, no clicks, split S2 Abd:  soft, positive bowel sounds, no organomegally, no rebound, no guarding Ext:  2 plus pulses, no edema, no cyanosis, no clubbing Skin:  No rashes no nodules Neuro:  CN II through XII intact, motor grossly intact  EKG - normal sinus rhythm with AV sequential pacing  DEVICE  Normal device function.  See PaceArt for details.   Assess/Plan:

## 2012-11-19 NOTE — Assessment & Plan Note (Signed)
Her blood pressure is well controlled today. I've encouraged her to continue her current medical therapy, and maintain a low-sodium diet.

## 2012-11-19 NOTE — Patient Instructions (Addendum)
Your physician wants you to follow-up in: 6 months with device clinic and ONE YEAR WITH DR Court Joy will receive a reminder letter in the mail two months in advance. If you don't receive a letter, please call our office to schedule the follow-up appointment.

## 2012-11-19 NOTE — Assessment & Plan Note (Signed)
Her Medtronic dual-chamber pacemaker is working normally. We'll plan to recheck in several months. 

## 2013-05-20 ENCOUNTER — Ambulatory Visit (INDEPENDENT_AMBULATORY_CARE_PROVIDER_SITE_OTHER): Payer: Medicare Other | Admitting: *Deleted

## 2013-05-20 DIAGNOSIS — I4891 Unspecified atrial fibrillation: Secondary | ICD-10-CM

## 2013-05-20 LAB — PACEMAKER DEVICE OBSERVATION
AL IMPEDENCE PM: 465 Ohm
AL THRESHOLD: 0.75 V
ATRIAL PACING PM: 97
BAMS-0001: 175 {beats}/min
RV LEAD IMPEDENCE PM: 648 Ohm

## 2013-05-20 NOTE — Progress Notes (Signed)
PPM check in office. 

## 2013-06-12 ENCOUNTER — Encounter: Payer: Self-pay | Admitting: Internal Medicine

## 2013-11-22 ENCOUNTER — Encounter: Payer: Self-pay | Admitting: Internal Medicine

## 2013-11-22 ENCOUNTER — Ambulatory Visit (INDEPENDENT_AMBULATORY_CARE_PROVIDER_SITE_OTHER): Payer: Medicare Other | Admitting: Internal Medicine

## 2013-11-22 VITALS — BP 172/88 | HR 67 | Ht 64.0 in | Wt 234.8 lb

## 2013-11-22 DIAGNOSIS — I4892 Unspecified atrial flutter: Secondary | ICD-10-CM

## 2013-11-22 DIAGNOSIS — I5032 Chronic diastolic (congestive) heart failure: Secondary | ICD-10-CM

## 2013-11-22 DIAGNOSIS — I498 Other specified cardiac arrhythmias: Secondary | ICD-10-CM

## 2013-11-22 DIAGNOSIS — R001 Bradycardia, unspecified: Secondary | ICD-10-CM

## 2013-11-22 DIAGNOSIS — I10 Essential (primary) hypertension: Secondary | ICD-10-CM

## 2013-11-22 DIAGNOSIS — I4891 Unspecified atrial fibrillation: Secondary | ICD-10-CM

## 2013-11-22 DIAGNOSIS — Z950 Presence of cardiac pacemaker: Secondary | ICD-10-CM

## 2013-11-22 LAB — MDC_IDC_ENUM_SESS_TYPE_INCLINIC
Battery Remaining Longevity: 96 mo
Battery Voltage: 2.78 V
Lead Channel Impedance Value: 465 Ohm
Lead Channel Pacing Threshold Amplitude: 0.5 V
Lead Channel Pacing Threshold Pulse Width: 0.4 ms
Lead Channel Setting Pacing Amplitude: 2.25 V
Lead Channel Setting Sensing Sensitivity: 2 mV
MDC IDC MSMT BATTERY IMPEDANCE: 276 Ohm
MDC IDC MSMT LEADCHNL RA PACING THRESHOLD AMPLITUDE: 1.25 V
MDC IDC MSMT LEADCHNL RA PACING THRESHOLD PULSEWIDTH: 0.4 ms
MDC IDC MSMT LEADCHNL RV IMPEDANCE VALUE: 589 Ohm
MDC IDC SESS DTM: 20150313193524
MDC IDC SET LEADCHNL RV PACING AMPLITUDE: 2.5 V
MDC IDC SET LEADCHNL RV PACING PULSEWIDTH: 0.4 ms
MDC IDC STAT BRADY AP VP PERCENT: 99 %
MDC IDC STAT BRADY AP VS PERCENT: 0 %
MDC IDC STAT BRADY AS VP PERCENT: 1 %
MDC IDC STAT BRADY AS VS PERCENT: 0 %

## 2013-11-22 NOTE — Assessment & Plan Note (Signed)
Her heart failure is class 2. She will continue her current meds. She is encouraged to keep her feet elevated and reduce her sodium intake.

## 2013-11-22 NOTE — Patient Instructions (Signed)
Your physician recommends that you continue on your current medications as directed. Please refer to the Current Medication list given to you today.  Remote monitoring is used to monitor your Pacemaker of ICD from home. This monitoring reduces the number of office visits required to check your device to one time per year. It allows us to keep an eye on the functioning of your device to ensure it is working properly. You are scheduled for a device check from home on 02/24/14. You may send your transmission at any time that day. If you have a wireless device, the transmission will be sent automatically. After your physician reviews your transmission, you will receive a postcard with your next transmission date.  Your physician wants you to follow-up in: 1 year with Dr. Taylor.  You will receive a reminder letter in the mail two months in advance. If you don't receive a letter, please call our office to schedule the follow-up appointment.  

## 2013-11-22 NOTE — Assessment & Plan Note (Signed)
Her Medtronic DDD PM is working normally. Will recheck in several months. 

## 2013-11-22 NOTE — Assessment & Plan Note (Signed)
Her blood pressure is elevated. I have asked her to reduce her salt intake. She admits to dietary indiscretion.

## 2013-11-22 NOTE — Progress Notes (Signed)
HPI [name] returns today for followup. She is a very pleasant 79 year old woman with a history of symptomatic bradycardia, hypertension, paroxysmal atrial fibrillation, and dyslipidemia. She is status post permanent pacemaker insertion. In the interim, she has done well, and denies palpitations, chest pain, or shortness of breath. No syncope. Allergies  Allergen Reactions  . Diphenoxylate-Atropine   . Penicillins      Current Outpatient Prescriptions  Medication Sig Dispense Refill  . alendronate (FOSAMAX) 70 MG tablet Take 70 mg by mouth every 7 (seven) days. Take with a full glass of water on an empty stomach.      Marland Kitchen. atenolol (TENORMIN) 25 MG tablet Two in the am and one in the pm      . atorvastatin (LIPITOR) 20 MG tablet Take 20 mg by mouth daily.      . carbidopa-levodopa (SINEMET IR) 25-100 MG per tablet Take 1 tablet by mouth 3 (three) times daily.      . cephALEXin (KEFLEX) 250 MG capsule Take 250 mg by mouth daily.      . Cyanocobalamin (VITAMIN B12 PO) Take 2,000 mg by mouth daily.       . digoxin (LANOXIN) 0.25 MG tablet Take 1/2 tablet (0.125mg ) daily      . fish oil-omega-3 fatty acids 1000 MG capsule Take 2 g by mouth daily.      Marland Kitchen. glimepiride (AMARYL) 4 MG tablet Take 4 mg by mouth daily before breakfast.      . hydrochlorothiazide (HYDRODIURIL) 25 MG tablet Take 25 mg by mouth daily.      . insulin glargine (LANTUS) 100 UNIT/ML injection Inject 20 Units into the skin 2 (two) times daily.       Marland Kitchen. lisinopril (PRINIVIL,ZESTRIL) 40 MG tablet Take 40 mg by mouth daily.       . metFORMIN (GLUCOPHAGE) 1000 MG tablet Take 1,000 mg by mouth 2 (two) times daily with a meal.      . VITAMIN D, CHOLECALCIFEROL, PO Take 2,000 Units by mouth daily.       Marland Kitchen. warfarin (COUMADIN) 5 MG tablet Take 5 mg by mouth daily. As precribed       No current facility-administered medications for this visit.     Past Medical History  Diagnosis Date  . Type 2 diabetes mellitus 1972  .  Hyperlipidemia   . HTN (hypertension)   . Morbid obesity   . AF (atrial fibrillation)   . Osteoporosis   . Vitamin B12 deficiency   . Low back pain   . Cellulitis of foot, right   . Diabetic retinopathy   . Ankle fracture, right 1972    ROS:   All systems reviewed and negative except as noted in the HPI.   Past Surgical History  Procedure Laterality Date  . Cholecystectomy  1985  . Pacemaker insertion      [employer]tronic     No family history on file.   History   Social History  . Marital Status: Married    Spouse Name: N/A    Number of Children: N/A  . Years of Education: N/A   Occupational History  . Not on file.   Social History Main Topics  . Smoking status: Never Smoker   . Smokeless tobacco: Not on file  . Alcohol Use: No  . Drug Use: No  . Sexual Activity: Not on file   Other Topics Concern  . Not on file   Social History Narrative  . No narrative on file  BP 172/88  Pulse 67  Ht 5\' 4"  (1.626 m)  Wt 234 lb 12.8 oz (106.505 kg)  BMI 40.28 kg/m2  Physical Exam:  Chronically ill but stable appearing 79 year old woman,NAD HEENT: Unremarkable Neck:  7 cm JVD, no thyromegally Lungs:  Clear with no wheezes, rales, or rhonchi. HEART:  Regular rate rhythm, no murmurs, no rubs, no clicks, split S2 Abd:  soft, positive bowel sounds, no organomegally, no rebound, no guarding Ext:  2 plus pulses, 2+ peripheral edema, no cyanosis, no clubbing Skin:  No rashes no nodules Neuro:  CN II through XII intact, motor grossly intact  EKG - normal sinus rhythm with AV sequential pacing  DEVICE  Normal device function.  See PaceArt for details.   Assess/Plan:

## 2013-12-12 ENCOUNTER — Encounter: Payer: Self-pay | Admitting: Cardiology

## 2014-02-24 ENCOUNTER — Encounter: Payer: Medicare Other | Admitting: *Deleted

## 2014-02-24 ENCOUNTER — Telehealth: Payer: Self-pay | Admitting: Cardiology

## 2014-02-24 NOTE — Telephone Encounter (Signed)
Spoke with pt and reminded pt of remote transmission that is due today. Pt stated that she had transmitted and that all lights lite up and even the target. I gave pt technical support number for [employer] so she can call and try and trouble shoot monitor.

## 2014-02-25 ENCOUNTER — Encounter: Payer: Self-pay | Admitting: Cardiology

## 2014-06-24 ENCOUNTER — Encounter: Payer: Self-pay | Admitting: Cardiology

## 2014-09-26 ENCOUNTER — Encounter: Payer: Self-pay | Admitting: *Deleted

## 2014-11-29 ENCOUNTER — Emergency Department (HOSPITAL_COMMUNITY): Payer: Medicare Other

## 2014-11-29 ENCOUNTER — Inpatient Hospital Stay (HOSPITAL_COMMUNITY)
Admission: EM | Admit: 2014-11-29 | Discharge: 2014-12-05 | DRG: 871 | Disposition: A | Discharge status: Skilled Nursing Facility | Payer: Medicare Other | Attending: Internal Medicine | Admitting: Internal Medicine

## 2014-11-29 DIAGNOSIS — Z880 Allergy status to penicillin: Secondary | ICD-10-CM | POA: Diagnosis not present

## 2014-11-29 DIAGNOSIS — L03116 Cellulitis of left lower limb: Secondary | ICD-10-CM | POA: Diagnosis present

## 2014-11-29 DIAGNOSIS — Z7901 Long term (current) use of anticoagulants: Secondary | ICD-10-CM | POA: Diagnosis not present

## 2014-11-29 DIAGNOSIS — Z888 Allergy status to other drugs, medicaments and biological substances status: Secondary | ICD-10-CM | POA: Diagnosis not present

## 2014-11-29 DIAGNOSIS — Z515 Encounter for palliative care: Secondary | ICD-10-CM | POA: Diagnosis not present

## 2014-11-29 DIAGNOSIS — R7989 Other specified abnormal findings of blood chemistry: Secondary | ICD-10-CM

## 2014-11-29 DIAGNOSIS — J9601 Acute respiratory failure with hypoxia: Secondary | ICD-10-CM | POA: Diagnosis present

## 2014-11-29 DIAGNOSIS — Z794 Long term (current) use of insulin: Secondary | ICD-10-CM

## 2014-11-29 DIAGNOSIS — A419 Sepsis, unspecified organism: Principal | ICD-10-CM | POA: Diagnosis present

## 2014-11-29 DIAGNOSIS — N189 Chronic kidney disease, unspecified: Secondary | ICD-10-CM | POA: Diagnosis present

## 2014-11-29 DIAGNOSIS — L03115 Cellulitis of right lower limb: Secondary | ICD-10-CM | POA: Diagnosis present

## 2014-11-29 DIAGNOSIS — I248 Other forms of acute ischemic heart disease: Secondary | ICD-10-CM | POA: Diagnosis present

## 2014-11-29 DIAGNOSIS — N184 Chronic kidney disease, stage 4 (severe): Secondary | ICD-10-CM | POA: Diagnosis present

## 2014-11-29 DIAGNOSIS — J189 Pneumonia, unspecified organism: Secondary | ICD-10-CM | POA: Diagnosis present

## 2014-11-29 DIAGNOSIS — E43 Unspecified severe protein-calorie malnutrition: Secondary | ICD-10-CM | POA: Diagnosis present

## 2014-11-29 DIAGNOSIS — I482 Chronic atrial fibrillation: Secondary | ICD-10-CM | POA: Diagnosis not present

## 2014-11-29 DIAGNOSIS — L97519 Non-pressure chronic ulcer of other part of right foot with unspecified severity: Secondary | ICD-10-CM | POA: Diagnosis present

## 2014-11-29 DIAGNOSIS — I129 Hypertensive chronic kidney disease with stage 1 through stage 4 chronic kidney disease, or unspecified chronic kidney disease: Secondary | ICD-10-CM | POA: Diagnosis present

## 2014-11-29 DIAGNOSIS — M810 Age-related osteoporosis without current pathological fracture: Secondary | ICD-10-CM | POA: Diagnosis present

## 2014-11-29 DIAGNOSIS — Z6841 Body Mass Index (BMI) 40.0 and over, adult: Secondary | ICD-10-CM | POA: Diagnosis not present

## 2014-11-29 DIAGNOSIS — Z9049 Acquired absence of other specified parts of digestive tract: Secondary | ICD-10-CM | POA: Diagnosis present

## 2014-11-29 DIAGNOSIS — R6520 Severe sepsis without septic shock: Secondary | ICD-10-CM | POA: Diagnosis present

## 2014-11-29 DIAGNOSIS — Z950 Presence of cardiac pacemaker: Secondary | ICD-10-CM | POA: Diagnosis not present

## 2014-11-29 DIAGNOSIS — I4891 Unspecified atrial fibrillation: Secondary | ICD-10-CM | POA: Diagnosis present

## 2014-11-29 DIAGNOSIS — E11319 Type 2 diabetes mellitus with unspecified diabetic retinopathy without macular edema: Secondary | ICD-10-CM | POA: Diagnosis present

## 2014-11-29 DIAGNOSIS — I5033 Acute on chronic diastolic (congestive) heart failure: Secondary | ICD-10-CM | POA: Diagnosis present

## 2014-11-29 DIAGNOSIS — G20 Parkinson's disease: Secondary | ICD-10-CM | POA: Diagnosis present

## 2014-11-29 DIAGNOSIS — Z66 Do not resuscitate: Secondary | ICD-10-CM | POA: Diagnosis present

## 2014-11-29 DIAGNOSIS — E0821 Diabetes mellitus due to underlying condition with diabetic nephropathy: Secondary | ICD-10-CM

## 2014-11-29 DIAGNOSIS — M869 Osteomyelitis, unspecified: Secondary | ICD-10-CM

## 2014-11-29 DIAGNOSIS — I5032 Chronic diastolic (congestive) heart failure: Secondary | ICD-10-CM | POA: Diagnosis present

## 2014-11-29 DIAGNOSIS — R531 Weakness: Secondary | ICD-10-CM

## 2014-11-29 DIAGNOSIS — R0600 Dyspnea, unspecified: Secondary | ICD-10-CM

## 2014-11-29 DIAGNOSIS — R778 Other specified abnormalities of plasma proteins: Secondary | ICD-10-CM

## 2014-11-29 DIAGNOSIS — E785 Hyperlipidemia, unspecified: Secondary | ICD-10-CM | POA: Diagnosis present

## 2014-11-29 DIAGNOSIS — I10 Essential (primary) hypertension: Secondary | ICD-10-CM | POA: Diagnosis present

## 2014-11-29 LAB — COMPREHENSIVE METABOLIC PANEL
ALT: 16 U/L (ref 0–35)
ANION GAP: 11 (ref 5–15)
AST: 59 U/L — AB (ref 0–37)
Albumin: 2.8 g/dL — ABNORMAL LOW (ref 3.5–5.2)
Alkaline Phosphatase: 66 U/L (ref 39–117)
BUN: 37 mg/dL — ABNORMAL HIGH (ref 6–23)
CALCIUM: 8.8 mg/dL (ref 8.4–10.5)
CHLORIDE: 105 mmol/L (ref 96–112)
CO2: 22 mmol/L (ref 19–32)
Creatinine, Ser: 1.52 mg/dL — ABNORMAL HIGH (ref 0.50–1.10)
GFR calc Af Amer: 37 mL/min — ABNORMAL LOW (ref >90)
GFR calc non Af Amer: 32 mL/min — ABNORMAL LOW (ref >90)
Glucose, Bld: 182 mg/dL — ABNORMAL HIGH (ref 70–99)
Potassium: 3.2 mmol/L — ABNORMAL LOW (ref 3.5–5.1)
Sodium: 138 mmol/L (ref 135–145)
Total Bilirubin: 1.4 mg/dL — ABNORMAL HIGH (ref 0.3–1.2)
Total Protein: 5.9 g/dL — ABNORMAL LOW (ref 6.0–8.3)

## 2014-11-29 LAB — URINALYSIS, ROUTINE W REFLEX MICROSCOPIC
GLUCOSE, UA: NEGATIVE mg/dL
Ketones, ur: NEGATIVE mg/dL
Leukocytes, UA: NEGATIVE
NITRITE: NEGATIVE
PH: 5 (ref 5.0–8.0)
Specific Gravity, Urine: 1.021 (ref 1.005–1.030)
Urobilinogen, UA: 0.2 mg/dL (ref 0.0–1.0)

## 2014-11-29 LAB — URINE MICROSCOPIC-ADD ON

## 2014-11-29 LAB — BRAIN NATRIURETIC PEPTIDE: B Natriuretic Peptide: 521.2 pg/mL — ABNORMAL HIGH (ref 0.0–100.0)

## 2014-11-29 LAB — INFLUENZA PANEL BY PCR (TYPE A & B)
H1N1FLUPCR: NOT DETECTED
Influenza A By PCR: NEGATIVE
Influenza B By PCR: NEGATIVE

## 2014-11-29 LAB — I-STAT CG4 LACTIC ACID, ED
Lactic Acid, Venous: 5.4 mmol/L (ref 0.5–2.0)
Lactic Acid, Venous: 2.91 mmol/L (ref 0.5–2.0)

## 2014-11-29 LAB — CBC WITH DIFFERENTIAL/PLATELET
BASOS PCT: 0 % (ref 0–1)
Basophils Absolute: 0 10*3/uL (ref 0.0–0.1)
EOS ABS: 0 10*3/uL (ref 0.0–0.7)
Eosinophils Relative: 0 % (ref 0–5)
HCT: 34.9 % — ABNORMAL LOW (ref 36.0–46.0)
HEMOGLOBIN: 11.1 g/dL — AB (ref 12.0–15.0)
LYMPHS PCT: 2 % — AB (ref 12–46)
Lymphs Abs: 0.3 10*3/uL — ABNORMAL LOW (ref 0.7–4.0)
MCH: 26.6 pg (ref 26.0–34.0)
MCHC: 31.8 g/dL (ref 30.0–36.0)
MCV: 83.7 fL (ref 78.0–100.0)
MONO ABS: 0.5 10*3/uL (ref 0.1–1.0)
Monocytes Relative: 3 % (ref 3–12)
NEUTROS PCT: 95 % — AB (ref 43–77)
Neutro Abs: 13.1 10*3/uL — ABNORMAL HIGH (ref 1.7–7.7)
PLATELETS: 208 10*3/uL (ref 150–400)
RBC: 4.17 MIL/uL (ref 3.87–5.11)
RDW: 16.2 % — AB (ref 11.5–15.5)
WBC: 13.9 10*3/uL — ABNORMAL HIGH (ref 4.0–10.5)

## 2014-11-29 LAB — GLUCOSE, CAPILLARY: Glucose-Capillary: 292 mg/dL — ABNORMAL HIGH (ref 70–99)

## 2014-11-29 LAB — APTT: aPTT: 47 seconds — ABNORMAL HIGH (ref 24–37)

## 2014-11-29 LAB — PROTIME-INR
INR: 2.6 — ABNORMAL HIGH (ref 0.00–1.49)
Prothrombin Time: 28 seconds — ABNORMAL HIGH (ref 11.6–15.2)

## 2014-11-29 LAB — LACTIC ACID, PLASMA: Lactic Acid, Venous: 2 mmol/L (ref 0.5–2.0)

## 2014-11-29 LAB — LIPASE, BLOOD: Lipase: 18 U/L (ref 11–59)

## 2014-11-29 LAB — PROCALCITONIN: Procalcitonin: 13.32 ng/mL

## 2014-11-29 LAB — I-STAT TROPONIN, ED: TROPONIN I, POC: 0.2 ng/mL — AB (ref 0.00–0.08)

## 2014-11-29 LAB — TROPONIN I
TROPONIN I: 0.23 ng/mL — AB (ref <0.031)
Troponin I: 0.29 ng/mL — ABNORMAL HIGH (ref <0.031)

## 2014-11-29 LAB — CLOSTRIDIUM DIFFICILE BY PCR: Toxigenic C. Difficile by PCR: NEGATIVE

## 2014-11-29 LAB — MRSA PCR SCREENING: MRSA by PCR: NEGATIVE

## 2014-11-29 LAB — DIGOXIN LEVEL: Digoxin Level: 1.1 ng/mL (ref 0.8–2.0)

## 2014-11-29 MED ORDER — SODIUM CHLORIDE 0.9 % IJ SOLN
3.0000 mL | Freq: Two times a day (BID) | INTRAMUSCULAR | Status: DC
  Administered 2014-11-29 – 2014-11-30 (×2): 3 mL via INTRAVENOUS

## 2014-11-29 MED ORDER — FUROSEMIDE 10 MG/ML IJ SOLN
INTRAMUSCULAR | Status: AC
  Filled 2014-11-29: qty 2

## 2014-11-29 MED ORDER — SODIUM CHLORIDE 0.9 % IV SOLN
INTRAVENOUS | Status: DC
  Administered 2014-11-29: 18:00:00 via INTRAVENOUS

## 2014-11-29 MED ORDER — LEVOFLOXACIN IN D5W 750 MG/150ML IV SOLN
750.0000 mg | Freq: Once | INTRAVENOUS | Status: AC
  Administered 2014-11-29: 750 mg via INTRAVENOUS
  Filled 2014-11-29: qty 150

## 2014-11-29 MED ORDER — VANCOMYCIN HCL 10 G IV SOLR
1250.0000 mg | INTRAVENOUS | Status: DC
  Filled 2014-11-29 (×2): qty 1250

## 2014-11-29 MED ORDER — ACETAMINOPHEN 325 MG PO TABS
650.0000 mg | ORAL_TABLET | Freq: Four times a day (QID) | ORAL | Status: DC | PRN
  Administered 2014-12-02 – 2014-12-04 (×3): 650 mg via ORAL
  Filled 2014-11-29 (×5): qty 2

## 2014-11-29 MED ORDER — SODIUM CHLORIDE 0.9 % IV BOLUS (SEPSIS)
500.0000 mL | INTRAVENOUS | Status: AC
  Administered 2014-11-29: 500 mL via INTRAVENOUS

## 2014-11-29 MED ORDER — ACETAMINOPHEN 650 MG RE SUPP
650.0000 mg | Freq: Four times a day (QID) | RECTAL | Status: DC | PRN
Start: 2014-11-29 — End: 2014-12-05

## 2014-11-29 MED ORDER — SODIUM CHLORIDE 0.9 % IV SOLN: INTRAVENOUS | Status: DC

## 2014-11-29 MED ORDER — RISAQUAD PO CAPS
ORAL_CAPSULE | Freq: Every day | ORAL | Status: DC
  Filled 2014-11-29: qty 1

## 2014-11-29 MED ORDER — ONDANSETRON HCL 4 MG PO TABS
4.0000 mg | ORAL_TABLET | Freq: Four times a day (QID) | ORAL | Status: DC | PRN
  Administered 2014-12-03: 4 mg via ORAL
  Filled 2014-11-29: qty 1

## 2014-11-29 MED ORDER — GUAIFENESIN-DM 100-10 MG/5ML PO SYRP
5.0000 mL | ORAL_SOLUTION | ORAL | Status: DC | PRN
  Filled 2014-11-29: qty 5

## 2014-11-29 MED ORDER — INSULIN GLARGINE 100 UNIT/ML ~~LOC~~ SOLN
8.0000 [IU] | Freq: Every day | SUBCUTANEOUS | Status: DC
  Administered 2014-11-29: 8 [IU] via SUBCUTANEOUS
  Filled 2014-11-29 (×2): qty 0.08

## 2014-11-29 MED ORDER — LEVOFLOXACIN IN D5W 750 MG/150ML IV SOLN
750.0000 mg | INTRAVENOUS | Status: DC
Start: 2014-12-01 — End: 2014-11-30

## 2014-11-29 MED ORDER — CARBIDOPA-LEVODOPA 25-100 MG PO TABS
1.0000 | ORAL_TABLET | Freq: Three times a day (TID) | ORAL | Status: DC
Start: 2014-11-29 — End: 2014-12-05
  Administered 2014-11-29 – 2014-12-05 (×11): 1 via ORAL
  Filled 2014-11-29 (×21): qty 1

## 2014-11-29 MED ORDER — ACETAMINOPHEN 500 MG PO TABS
1000.0000 mg | ORAL_TABLET | Freq: Once | ORAL | Status: AC
  Administered 2014-11-29: 1000 mg via ORAL
  Filled 2014-11-29: qty 2

## 2014-11-29 MED ORDER — DIGOXIN 125 MCG PO TABS
0.1250 mg | ORAL_TABLET | Freq: Every day | ORAL | Status: DC
  Administered 2014-12-02 – 2014-12-05 (×4): 0.125 mg via ORAL
  Filled 2014-11-29 (×6): qty 1

## 2014-11-29 MED ORDER — FUROSEMIDE 10 MG/ML IJ SOLN
20.0000 mg | Freq: Once | INTRAMUSCULAR | Status: AC
  Administered 2014-11-29: 20 mg via INTRAVENOUS

## 2014-11-29 MED ORDER — INSULIN ASPART 100 UNIT/ML ~~LOC~~ SOLN: 0.0000 [IU] | Freq: Three times a day (TID) | SUBCUTANEOUS | Status: DC

## 2014-11-29 MED ORDER — VANCOMYCIN HCL IN DEXTROSE 1-5 GM/200ML-% IV SOLN: 1000.0000 mg | Freq: Once | INTRAVENOUS | Status: DC

## 2014-11-29 MED ORDER — DEXTROSE 5 % IV SOLN
2.0000 g | Freq: Once | INTRAVENOUS | Status: AC
  Administered 2014-11-29: 2 g via INTRAVENOUS
  Filled 2014-11-29: qty 2

## 2014-11-29 MED ORDER — SODIUM CHLORIDE 0.9 % IV BOLUS (SEPSIS)
1000.0000 mL | INTRAVENOUS | Status: AC
  Administered 2014-11-29 (×2): 1000 mL via INTRAVENOUS

## 2014-11-29 MED ORDER — DEXTROSE 5 % IV SOLN
1.0000 g | Freq: Three times a day (TID) | INTRAVENOUS | Status: DC
  Administered 2014-11-29 – 2014-11-30 (×2): 1 g via INTRAVENOUS
  Filled 2014-11-29 (×9): qty 1

## 2014-11-29 MED ORDER — ONDANSETRON HCL 4 MG/2ML IJ SOLN: 4.0000 mg | Freq: Four times a day (QID) | INTRAMUSCULAR | Status: DC | PRN

## 2014-11-29 MED ORDER — VANCOMYCIN HCL 10 G IV SOLR
2000.0000 mg | Freq: Once | INTRAVENOUS | Status: AC
  Administered 2014-11-29: 2000 mg via INTRAVENOUS
  Filled 2014-11-29: qty 2000

## 2014-11-29 MED ORDER — ALBUTEROL SULFATE (2.5 MG/3ML) 0.083% IN NEBU: 2.5000 mg | INHALATION_SOLUTION | RESPIRATORY_TRACT | Status: DC | PRN

## 2014-11-29 NOTE — Progress Notes (Signed)
Pt has no documented urine since admission, pts bladder scan showed 210-220 cc, initial assessment had pt's lungs  As diminished at approximately 2200 pt had audible rails and rhonchi throughout. Pt was previously taciphnic at times with nothing changed. Paged Daphane ShepherdM Lynch NP who ordered lasix 20 mg iv times one will continue to monitor

## 2014-11-29 NOTE — Progress Notes (Addendum)
ANTIBIOTIC CONSULT NOTE - INITIAL ANTICOAGULATION CONSULT NOTE - INITIAL  Pharmacy Consult for Vancomycin, Levaquin, Aztreonam; Coumadin Indication: rule out sepsis and atrial fibrillation  Allergies  Allergen Reactions  . Diphenoxylate-Atropine   . Penicillins     Patient Measurements: Height: 5\' 4"  (162.6 cm) Weight: 238 lb (107.956 kg) IBW/kg (Calculated) : 54.7 Vital Signs: Temp: 102.6 F (39.2 C) (03/19 1300) Temp Source: Rectal (03/19 1300) BP: 105/36 mmHg (03/19 1249) Pulse Rate: 65 (03/19 1249) Intake/Output from previous day:   Intake/Output from this shift:    Labs: No results for input(s): WBC, HGB, PLT, LABCREA, CREATININE in the last 72 hours. CrCl cannot be calculated (Patient has no serum creatinine result on file.). No results for input(s): VANCOTROUGH, VANCOPEAK, VANCORANDOM, GENTTROUGH, GENTPEAK, GENTRANDOM, TOBRATROUGH, TOBRAPEAK, TOBRARND, AMIKACINPEAK, AMIKACINTROU, AMIKACIN in the last 72 hours.   Microbiology: No results found for this or any previous visit (from the past 720 hour(s)).  Medical History: Past Medical History  Diagnosis Date  . Type 2 diabetes mellitus 1972  . Hyperlipidemia   . HTN (hypertension)   . Morbid obesity   . AF (atrial fibrillation)   . Osteoporosis   . Vitamin B12 deficiency   . Low back pain   . Cellulitis of foot, right   . Diabetic retinopathy   . Ankle fracture, right 1972    Medications:  Anti-infectives    Start     Dose/Rate Route Frequency Ordered Stop   11/29/14 1330  levofloxacin (LEVAQUIN) IVPB 750 mg     750 mg 100 mL/hr over 90 Minutes Intravenous  Once 11/29/14 1323     11/29/14 1330  aztreonam (AZACTAM) 2 g in dextrose 5 % 50 mL IVPB     2 g 100 mL/hr over 30 Minutes Intravenous  Once 11/29/14 1323     11/29/14 1330  vancomycin (VANCOCIN) IVPB 1000 mg/200 mL premix     1,000 mg 200 mL/hr over 60 Minutes Intravenous  Once 11/29/14 1323       Assessment: 80 year old female in ED with  weakness, diarrhea, and an episode of "shaking" lasting ~1 hr prior to ED to start IV Vancomycin, Aztreonam, and Levaquin for r/o sepsis. Patient has penicillin allergy -unknown reaction.   Labs pending. Pt is hypotensive, Tmax 102.6.  3/19 Blood >> 3/19 Urine >>  Goal of Therapy:  Vancomycin trough level 15-20 mcg/ml Clinical eradication of infection  Plan:  Vancomycin 2g IV x1 now. Continue with Levaquin 750mg  IV now. Continue with Aztreonam 2g IV now.  Follow-up lab results for further dosing.     Link SnufferJessica Millen, PharmD, BCPS Clinical Pharmacist [phone] 11/29/2014,1:25 PM  Addendum: Labs are back. She does have some mild renal insufficiency with sCr 1.5 and CrCl ~2937ml/min. Pharmacy has also been asked to resume her home coumadin for afib. INR is therapeutic at 2.6 (goal 2-3). Home dose is 5mg  daily except 7.5mg  on Tues/Thurs - last dose taken today.  Plan: 1) Vancomycin 1250mg  IV q24 2) Levaquin 750mg  IV q48 3) Aztreonam 1g IV q8 4) No further coumadin needed tonight 5) Daily INR  Louie CasaJennifer Solenne Manwarren, PharmD, BCPS 11/29/2014, 6:46 PM

## 2014-11-29 NOTE — ED Provider Notes (Signed)
CSN: [id number]     Arrival date & time 11/29/14  1245 History   First MD Initiated Contact with Patient 11/29/14 1247     Chief Complaint  Patient presents with  . Weakness     HPI  Patient presents with concern of weakness. Patient is here with daughters who assist with the history of present illness. They note that the patient is a history of parkinsonism, pacemaker, hypertension, atrial fibrillation. History that she takes her medication as directed. Over the past 3 days patient has had progressive weakness, and last night the patient was found on the floor after she slipped from her chair, without falling, without trauma. The patient herself states that she feels fine, but weak. She denies specific pain anywhere. Patient has known inflammatory condition in the right lower extremity, states this is unchanged.   Past Medical History  Diagnosis Date  . Type 2 diabetes mellitus 1972  . Hyperlipidemia   . HTN (hypertension)   . Morbid obesity   . AF (atrial fibrillation)   . Osteoporosis   . Vitamin B12 deficiency   . Low back pain   . Cellulitis of foot, right   . Diabetic retinopathy   . Ankle fracture, right 1972   Past Surgical History  Procedure Laterality Date  . Cholecystectomy  1985  . Pacemaker insertion      Medtronic   No family history on file. History  Substance Use Topics  . Smoking status: Never Smoker   . Smokeless tobacco: Not on file  . Alcohol Use: No   OB History    No data available     Review of Systems  Constitutional:       Per HPI, otherwise negative  HENT:       Per HPI, otherwise negative  Respiratory:       Per HPI, otherwise negative  Cardiovascular:       Per HPI, otherwise negative  Gastrointestinal: Negative for vomiting.  Endocrine:       Negative aside from HPI  Genitourinary:       Neg aside from HPI   Musculoskeletal:       Per HPI, otherwise negative  Skin: Positive for color change.  Allergic/Immunologic:   Parkinson's disease  Neurological: Positive for weakness and light-headedness. Negative for syncope.      Allergies  Diphenoxylate-atropine and Penicillins  Home Medications   Prior to Admission medications   Medication Sig Start Date End Date Taking? Authorizing Provider  alendronate (FOSAMAX) 70 MG tablet Take 70 mg by mouth every 7 (seven) days. Take with a full glass of water on an empty stomach.    Historical Provider, MD  atenolol (TENORMIN) 25 MG tablet Two in the am and one in the pm    Historical Provider, MD  atorvastatin (LIPITOR) 20 MG tablet Take 20 mg by mouth daily.    Historical Provider, MD  carbidopa-levodopa (SINEMET IR) 25-100 MG per tablet Take 1 tablet by mouth 3 (three) times daily.    Historical Provider, MD  cephALEXin (KEFLEX) 250 MG capsule Take 250 mg by mouth daily.    Historical Provider, MD  Cyanocobalamin (VITAMIN B12 PO) Take 2,000 mg by mouth daily.     Historical Provider, MD  digoxin (LANOXIN) 0.25 MG tablet Take 1/2 tablet (0.125mg ) daily    Historical Provider, MD  fish oil-omega-3 fatty acids 1000 MG capsule Take 2 g by mouth daily.    Historical Provider, MD  glimepiride (AMARYL) 4 MG tablet Take  4 mg by mouth daily before breakfast.    Historical Provider, MD  hydrochlorothiazide (HYDRODIURIL) 25 MG tablet Take 25 mg by mouth daily.    Historical Provider, MD  insulin glargine (LANTUS) 100 UNIT/ML injection Inject 20 Units into the skin 2 (two) times daily.     Historical Provider, MD  lisinopril (PRINIVIL,ZESTRIL) 40 MG tablet Take 40 mg by mouth daily.     Historical Provider, MD  metFORMIN (GLUCOPHAGE) 1000 MG tablet Take 1,000 mg by mouth 2 (two) times daily with a meal.    Historical Provider, MD  VITAMIN D, CHOLECALCIFEROL, PO Take 2,000 Units by mouth daily.     Historical Provider, MD  warfarin (COUMADIN) 5 MG tablet Take 5 mg by mouth daily. As precribed    Historical Provider, MD   BP 105/36 mmHg  Pulse 65  Temp(Src) 102.6 F (39.2 C)  (Rectal)  Resp 25  Ht  (1.626 m)  Wt 238 lb (107.956 kg)  BMI 40.83 kg/m2  SpO2 92% Physical Exam  Constitutional: She is oriented to person, place, and time. She appears listless.  Obese female lying supine, answering questions appropriately  HENT:  Head: Normocephalic and atraumatic.  Eyes: Conjunctivae and EOM are normal.  Cardiovascular: Normal rate and regular rhythm.   Pulmonary/Chest: No stridor. Tachypnea noted. She has decreased breath sounds.  Abdominal: She exhibits no distension.  Genitourinary:  Skin breakdown on gluteal cleft  Musculoskeletal: She exhibits no edema.  Neurological: She is oriented to person, place, and time. She appears listless. No cranial nerve deficit.  Skin: Skin is warm and dry.     Psychiatric: She has a normal mood and affect.  Nursing note and vitals reviewed.   ED Course  Procedures (including critical care time) Labs Review Labs Reviewed  CBC WITH DIFFERENTIAL/PLATELET - Abnormal; Notable for the following:    WBC 13.9 (*)    Hemoglobin 11.1 (*)    HCT 34.9 (*)    RDW 16.2 (*)    Neutrophils Relative % 95 (*)    Neutro Abs 13.1 (*)    Lymphocytes Relative 2 (*)    Lymphs Abs 0.3 (*)    All other components within normal limits  COMPREHENSIVE METABOLIC PANEL - Abnormal; Notable for the following:    Potassium 3.2 (*)    Glucose, Bld 182 (*)    BUN 37 (*)    Creatinine, Ser 1.52 (*)    Total Protein 5.9 (*)    Albumin 2.8 (*)    AST 59 (*)    Total Bilirubin 1.4 (*)    GFR calc non Af Amer 32 (*)    GFR calc Af Amer 37 (*)    All other components within normal limits  URINALYSIS, ROUTINE W REFLEX MICROSCOPIC - Abnormal; Notable for the following:    Color, Urine AMBER (*)    APPearance CLOUDY (*)    Hgb urine dipstick LARGE (*)    Bilirubin Urine SMALL (*)    Protein, ur >300 (*)    All other components within normal limits  APTT - Abnormal; Notable for the following:    aPTT 47 (*)    All other components within  normal limits  PROTIME-INR - Abnormal; Notable for the following:    Prothrombin Time 28.0 (*)    INR 2.60 (*)    All other components within normal limits  BRAIN NATRIURETIC PEPTIDE - Abnormal; Notable for the following:    B Natriuretic Peptide 521.2 (*)    All  other components within normal limits  TROPONIN I - Abnormal; Notable for the following:    Troponin I 0.29 (*)    All other components within normal limits  URINE MICROSCOPIC-ADD ON - Abnormal; Notable for the following:    Squamous Epithelial / LPF FEW (*)    Casts HYALINE CASTS (*)    All other components within normal limits  I-STAT CG4 LACTIC ACID, ED - Abnormal; Notable for the following:    Lactic Acid, Venous 5.40 (*)    All other components within normal limits  I-STAT TROPOININ, ED - Abnormal; Notable for the following:    Troponin i, poc 0.20 (*)    All other components within normal limits  CULTURE, BLOOD (ROUTINE X 2)  CULTURE, BLOOD (ROUTINE X 2)  URINE CULTURE  CLOSTRIDIUM DIFFICILE BY PCR  LIPASE, BLOOD  DIGOXIN LEVEL  I-STAT CG4 LACTIC ACID, ED    Imaging Review Dg Chest Port 1 View  11/29/2014   CLINICAL DATA:  pt w/weakness, diarrhea, and episode of "shaking" lasting approx 1 hour  EXAM: PORTABLE CHEST - 1 VIEW  COMPARISON:  08/01/2009  FINDINGS: There is patchy airspace opacity projecting in the upper lobes, right greater than left, new from the prior exam.  Cardiac silhouette is mildly enlarged. No mediastinal or hilar masses.  Right anterior chest wall sequential pacemaker is stable with its leads projecting over the right atrium and right ventricle.  IMPRESSION: 1. Airspace opacities noted in the upper lobes, right greater than left, consistent with multifocal pneumonia. Findings could potentially reflect asymmetric edema.   Electronically Signed   By: Amie Portlandavid  Ormond M.D.   On: 11/29/2014 13:38   I agree with the x-ray interpretation.    EKG Interpretation   Date/Time:  Saturday November 29 2014  13:29:14 EDT Ventricular Rate:  60 PR Interval:    QRS Duration: 160 QT Interval:  466 QTC Calculation: 466 R Axis:   -78 Text Interpretation:  Atrial fibrillation IVCD, consider atypical RBBB LVH  with secondary repolarization abnormality Inferior infarct, acute (RCA)  Anterior infarct, old Lateral leads are also involved Probable RV  involvement, suggest recording right precordial leads Atrial fibrillation  Non-specific intra-ventricular conduction delay Left ventricular  hypertrophy ST-t wave abnormality Abnormal ekg Confirmed by Gerhard MunchLOCKWOOD,  Chamberlain Steinborn  MD [phone]([id number]) on 11/29/2014 2:15:59 PM     Agents initial labs notable for elevated lactic acid, troponin. Patient was febrile on arrival, received fluid resuscitation, per sepsis protocol, with Tylenol.  Update: Patient confirms DNR status.  On repeat exam the patient is awake, alert, has received substantial fluid resuscitation, initiation of antibiotics. On cardiac monitor she has a paced rhythm 60 abnormal ST-T segment changes.  I discussed patient's case with our hospitalist team for admission.  MDM   Final diagnoses:  Sepsis, due to unspecified organism  CAP (community acquired pneumonia)  Cellulitis of right lower extremity  Elevated troponin   patient presents with weakness, is found to have evidence for sepsis, with likely pneumonia source. Patient also has evidence for cellulitis in her right distal lower extremity. Patient's evaluation is also notable for demonstration of elevated troponin, BNP, persistent chronic kidney disease. Patient does have therapeutic INR level, which should offer some benefit for likely ongoing cardiac demand ischemia. She had fluid resuscitation of 30ml / kg, empiric ABX in the ED.  She remained critically ill on admission, requiring step-down bed placement.  CRITICAL CARE Performed by: Gerhard MunchLOCKWOOD, Sabirin Baray Total critical care time: 35 Critical care time was exclusive of separately  billable  procedures and treating other patients. Critical care was necessary to treat or prevent imminent or life-threatening deterioration. Critical care was time spent personally by me on the following activities: development of treatment plan with patient and/or surrogate as well as nursing, discussions with consultants, evaluation of patient's response to treatment, examination of patient, obtaining history from patient or surrogate, ordering and performing treatments and interventions, ordering and review of laboratory studies, ordering and review of radiographic studies, pulse oximetry and re-evaluation of patient's condition.      Gerhard Munch, MD 11/29/14 [phone]

## 2014-11-29 NOTE — ED Notes (Signed)
SKIN ASSESSMENT: PT NOTED TO HAVE RED, NON-BLANCHING SKIN TO SACRAL AREA, WITH SMALL AREA OF BROKEN SKIN ALONG CREASE AT TOP OF BUTTOCKS.   PT NOTED TO HAVE BRUISING TO LABIA AND AROUND INNER BUTTOCKS - STATES THIS IS FROM "FALLING ONTO TOILET SEAT". BILATERAL LOWER LEGS NOTED TO BE RED, SWOLLEN, SCALY, WITH AREA OF BLACKENED, HARDENED FLESH TO POSTERIOR LEFT FOOT AT BASE OF HALLUX, PT STATES THIS IS A "CORN"

## 2014-11-29 NOTE — ED Notes (Signed)
Per ems and daughter of patient, pt w/weakness, diarrhea, and episode of "shaking" lasting approx 1 hour pta.  Pt arrives awake, alert, oriented.

## 2014-11-29 NOTE — H&P (Addendum)
PATIENT DETAILS Name: Leah [name] Age: 80 y.o. Sex: female Date of Birth: Dec 03, 1934 Admit Date: 11/29/2014 ZOX:WRUEAV,WUJW A, MD   CHIEF COMPLAINT:  Weakness  HPI: Leah [name] is a 80 y.o. female with a Past Medical History of chronic diastolic heart failure, atrial fibrillation on anticoagulation, Parkinson's disease, hypertension who presents today with the above noted complaint. Patient lives at home with her granddaughter. For the past 2-3 days, she has been more markedly weak than usual. She does have chronic cough that is essentially the same. Her weakness has gradually worsened over the past few days. Last evening, while sitting on a chair, she gradually slipped off and could not get up-and required assistance from family. This morning, she was noted to be "shaking". She was subsequently brought to the emergency room, where she was found tachycardic, febrile and at times her systolic blood pressure was noted to be in the 90s. Further evaluation revealed right-sided pneumonia with a significantly elevated lactic acid. I will subsequently asked to admit this patient for further evaluation and treatment During my evaluation, patient although lethargic and weak was easily able to participate in the history taking process. She was awake and alert. She denied any overt fever, claims that her cough was mostly dry and at her usual baseline. There is no history of nausea vomiting, she did have some loose watery stools last night. She denies any chest pain, shortness of breath or any abdominal pain.   ALLERGIES:   Allergies  Allergen Reactions  . Diphenoxylate-Atropine Other (See Comments)    Unknown reaction  . Penicillins Hives and Itching    PAST MEDICAL HISTORY: Past Medical History  Diagnosis Date  . Type 2 diabetes mellitus 1972  . Hyperlipidemia   . HTN (hypertension)   . Morbid obesity   . AF (atrial fibrillation)   . Osteoporosis   . Vitamin B12  deficiency   . Low back pain   . Cellulitis of foot, right   . Diabetic retinopathy   . Ankle fracture, right 1972    PAST SURGICAL HISTORY: Past Surgical History  Procedure Laterality Date  . Cholecystectomy  1985  . Pacemaker insertion      [employer]    MEDICATIONS AT HOME: Prior to Admission medications   Medication Sig Start Date End Date Taking? Authorizing Provider  atenolol (TENORMIN) 25 MG tablet Take 75 mg by mouth daily. Two in the am and one in the pm   Yes Historical Provider, MD  atorvastatin (LIPITOR) 20 MG tablet Take 20 mg by mouth daily at 6 PM.    Yes Historical Provider, MD  carbidopa-levodopa (SINEMET IR) 25-100 MG per tablet Take 1 tablet by mouth 3 (three) times daily.   Yes Historical Provider, MD  cephALEXin (KEFLEX) 250 MG capsule Take 250 mg by mouth daily. To prevent urinary tract infection.   Yes Historical Provider, MD  Cyanocobalamin (VITAMIN B12 PO) Take 2,000 mg by mouth at bedtime.    Yes Historical Provider, MD  digoxin (LANOXIN) 0.125 MG tablet Take 0.125 mg by mouth daily.   Yes Historical Provider, MD  fish oil-omega-3 fatty acids 1000 MG capsule Take 2 g by mouth at bedtime.    Yes Historical Provider, MD  glimepiride (AMARYL) 4 MG tablet Take 4 mg by mouth daily before breakfast.   Yes Historical Provider, MD  hydrochlorothiazide (HYDRODIURIL) 25 MG tablet Take 25 mg by mouth every morning.    Yes Historical Provider, MD  insulin glargine (LANTUS) 100 UNIT/ML injection Inject 20 Units into the skin 2 (two) times daily.    Yes Historical Provider, MD  Lactobacillus-Inulin (CULTURELLE DIGESTIVE HEALTH) CAPS Take 1 capsule by mouth daily.   Yes Historical Provider, MD  lisinopril (PRINIVIL,ZESTRIL) 40 MG tablet Take 40 mg by mouth at bedtime.    Yes Historical Provider, MD  metFORMIN (GLUCOPHAGE) 1000 MG tablet Take 1,000 mg by mouth 2 (two) times daily with a meal.   Yes Historical Provider, MD  VITAMIN D, CHOLECALCIFEROL, PO Take 2,000 Units by  mouth at bedtime.    Yes Historical Provider, MD  warfarin (COUMADIN) 5 MG tablet Take 5 mg by mouth daily at 6 PM. 7.5 mg on Tues and Thurs. 5 mg on Mon, Wed, Fri, Sat, and Sun.   Yes Historical Provider, MD  alendronate (FOSAMAX) 70 MG tablet Take 70 mg by mouth every 7 (seven) days. Take with a full glass of water on an empty stomach.    Historical Provider, MD  digoxin (LANOXIN) 0.25 MG tablet Take 1/2 tablet (0.125mg ) daily    Historical Provider, MD    FAMILY HISTORY: No family history of CAD   SOCIAL HISTORY:  reports that she has never smoked. She does not have any smokeless tobacco history on file. She reports that she does not drink alcohol or use illicit drugs.  REVIEW OF SYSTEMS:  Constitutional:   No  weight loss, night sweats  HEENT:    No headaches, Difficulty swallowing,Tooth/dental problems,Sore throat,   Cardio-vascular: No chest pain,  Orthopnea, PND  GI:  No heartburn, indigestion, abdominal pain, nausea, vomiting  Resp: No coughing up of blood.No chest wall deformity  Skin:  no rash or lesions.  GU:  no dysuria, change in color of urine, no urgency or frequency.  No flank pain.  Musculoskeletal: No joint pain or swelling.  No decreased range of motion.  No back pain.  Psych: No change in mood or affect. No depression or anxiety.  No memory loss.   PHYSICAL EXAM: Blood pressure 105/36, pulse 65, temperature 102.6 F (39.2 C), temperature source Rectal, resp. rate 25, height 5\' 4"  (1.626 m), weight 107.956 kg (238 lb), SpO2 92 %.  General appearance :Awake, alert, looks acutely sick.she is lethargic, but easily arousable. Speech is slow and clear.   HEENT: Atraumatic and Normocephalic, pupils equally reactive to light and accomodation Neck: supple, no JVD. No cervical lymphadenopathy.  Chmoving air bilaterally, has rales up to mid zone in both lungs but more in the right compared to the left.  CVS: S1 S2 irregular.  Abdomen: Bowel sounds present,  Non tender and not distended with no gaurding, rigidity or rebound. abdomen is obese.  Extremities: B/L Lower Ext showChronic venous stasis changes-there is erythema and swelling of the right lower extremity. She does appear to have a callus that is mostly dry on the plantar aspect of the right foot. Neurology: Awake alert, Non focal-but with significant generalized weakness  Skin:No Rash Wounds:N/A  LABS ON ADMISSION:   Recent Labs  11/29/14 1343  NA 138  K 3.2*  CL 105  CO2 22  GLUCOSE 182*  BUN 37*  CREATININE 1.52*  CALCIUM 8.8    Recent Labs  11/29/14 1343  AST 59*  ALT 16  ALKPHOS 66  BILITOT 1.4*  PROT 5.9*  ALBUMIN 2.8*    Recent Labs  11/29/14 1343  LIPASE 18    Recent Labs  11/29/14 1343  WBC 13.9*  NEUTROABS 13.1*  HGB 11.1*  HCT 34.9*  MCV 83.7  PLT 208    Recent Labs  11/29/14 1343  TROPONINI 0.29*   No results for input(s): DDIMER in the last 72 hours. Invalid input(s): POCBNP   RADIOLOGIC STUDIES ON ADMISSION: Dg Chest Port 1 View  11/29/2014   CLINICAL DATA:  pt w/weakness, diarrhea, and episode of "shaking" lasting approx 1 hour  EXAM: PORTABLE CHEST - 1 VIEW  COMPARISON:  08/01/2009  FINDINGS: There is patchy airspace opacity projecting in the upper lobes, right greater than left, new from the prior exam.  Cardiac silhouette is mildly enlarged. No mediastinal or hilar masses.  Right anterior chest wall sequential pacemaker is stable with its leads projecting over the right atrium and right ventricle.  IMPRESSION: 1. Airspace opacities noted in the upper lobes, right greater than left, consistent with multifocal pneumonia. Findings could potentially reflect asymmetric edema.   Electronically Signed   By: Amie Portland M.D.   On: 11/29/2014 13:38     EKG: Independently reviewed. Afib  ASSESSMENT AND PLAN: Present on Admission:  . Severe sepsis: Secondary to pneumonia and right leg cellulitis. Continue IV fluid resuscitation and  empiric vancomycin, aztreonam and Levaquin. Follow culture data. Monitor closely in step down. This M.D. has had a long discussion with the patient and daughter-in-law at bedside, DO NOT RESUSCITATE in place. If any further deterioration, patient and family to comfort measures.   Marland Kitchen PNA (pneumonia): Will continue with vancomycin, aztreonam and Levaquin for now. Follow clinical course and depending on culture data could narrow antibiotics in the next few days if improved.  Have ordered influenza PCR.  Marland Kitchen Acute respiratory failure-hypoxic: Likely secondary to above. Treat underlying pneumonia, oxygen as needed for now. Monitor closely as patient does have a history of chronic diastolic heart failure.  . Cellulitis of right leg: Will be on vancomycin that should be sufficient in this case. On Coumadin with a therapeutic INR-therefore low suspicion for DVT.   Marland Kitchen HYPERTENSION, BENIGN: Blood pressure currently soft-hold all antihypertensive medications. Patient currently getting IV fluid resuscitation  . Chronic kidney disease stage IV:  chemistries pending at the time of this dictation. Will follow to see if patient has worsening of her creatinine secondary to severe sepsis and pneumonia. Monitor intake output.   . Chronic diastolic heart failure: Suspect mostly compensated-has some mild lower extremity edema that appears to be chronic-mostly related to chronic venous stasis issues. Monitor closely while on IV fluids   . Atrial fibrillation: Hold atenolol-Chek digoxin level-if renal function significantly worse-may need to discontinue. Continue Coumadin per pharmacy.  Marland Kitchen PPM-[employer]: monitor in telemetry  . Palliative care/ethics: Discussed with patient, with granddaughter and daughter-in-law at bedside-she does not desire very aggressive care-DO NOT RESUSCITATE confirmed. Both patient and family are aware that patient appears to be critically ill, and at risk for significant deterioration. Both family  and patient aware that if if she deteriorates significantly, hospice measures may need to be constituted.    Addendum 5:30 pm Review of pending labs-troponin mildly elevated. Suspect that this is demand ischemia. Will manage medically. Patient and family were informed.   Further plan will depend as patient's clinical course evolves and further radiologic and laboratory data become available. Patient will be monitored closely.  Above noted plan was discussed with patient/family, they were in agreement.   DVT Prophylaxis: On coumadin  Code Status: DNR  Total time spent for admission equals 45 minutes.  [facility] Pager [phone]  If 7PM-7AM, please  contact night-coverage [email or web address] Password [facility] 11/29/2014, 3:33 PM

## 2014-11-29 NOTE — ED Notes (Signed)
Dr Fayrene FearingJames given a copy of lactic acid results 2.91

## 2014-11-29 NOTE — Progress Notes (Addendum)
Req WOC, eccymotic buttocks & vag area, cellulitis bilat legs, et all. Also see ED RN note from 3/19 as well.

## 2014-11-30 ENCOUNTER — Encounter (HOSPITAL_COMMUNITY): Payer: Self-pay | Admitting: *Deleted

## 2014-11-30 ENCOUNTER — Inpatient Hospital Stay (HOSPITAL_COMMUNITY): Payer: Medicare Other

## 2014-11-30 DIAGNOSIS — J189 Pneumonia, unspecified organism: Secondary | ICD-10-CM | POA: Insufficient documentation

## 2014-11-30 LAB — BASIC METABOLIC PANEL
ANION GAP: 11 (ref 5–15)
BUN: 44 mg/dL — ABNORMAL HIGH (ref 6–23)
CALCIUM: 8.2 mg/dL — AB (ref 8.4–10.5)
CO2: 23 mmol/L (ref 19–32)
CREATININE: 1.77 mg/dL — AB (ref 0.50–1.10)
Chloride: 104 mmol/L (ref 96–112)
GFR calc non Af Amer: 26 mL/min — ABNORMAL LOW (ref >90)
GFR, EST AFRICAN AMERICAN: 31 mL/min — AB (ref >90)
Glucose, Bld: 173 mg/dL — ABNORMAL HIGH (ref 70–99)
Potassium: 3.8 mmol/L (ref 3.5–5.1)
Sodium: 138 mmol/L (ref 135–145)

## 2014-11-30 LAB — CBC
HEMATOCRIT: 33.9 % — AB (ref 36.0–46.0)
HEMOGLOBIN: 10.7 g/dL — AB (ref 12.0–15.0)
MCH: 26.4 pg (ref 26.0–34.0)
MCHC: 31.6 g/dL (ref 30.0–36.0)
MCV: 83.5 fL (ref 78.0–100.0)
Platelets: 190 10*3/uL (ref 150–400)
RBC: 4.06 MIL/uL (ref 3.87–5.11)
RDW: 16.4 % — ABNORMAL HIGH (ref 11.5–15.5)
WBC: 13.9 10*3/uL — AB (ref 4.0–10.5)

## 2014-11-30 LAB — URINE CULTURE
COLONY COUNT: NO GROWTH
Culture: NO GROWTH

## 2014-11-30 LAB — PROTIME-INR
INR: 2.93 — ABNORMAL HIGH (ref 0.00–1.49)
Prothrombin Time: 30.8 seconds — ABNORMAL HIGH (ref 11.6–15.2)

## 2014-11-30 LAB — TROPONIN I
TROPONIN I: 0.21 ng/mL — AB (ref <0.031)
TROPONIN I: 0.53 ng/mL — AB (ref <0.031)

## 2014-11-30 MED ORDER — MORPHINE SULFATE 2 MG/ML IJ SOLN
2.0000 mg | INTRAMUSCULAR | Status: DC | PRN
  Administered 2014-11-30 – 2014-12-01 (×4): 2 mg via INTRAVENOUS
  Filled 2014-11-30 (×4): qty 1

## 2014-11-30 MED ORDER — MORPHINE SULFATE 2 MG/ML IJ SOLN
1.0000 mg | INTRAMUSCULAR | Status: DC | PRN
  Administered 2014-11-30: 1 mg via INTRAVENOUS
  Filled 2014-11-30: qty 1

## 2014-11-30 MED ORDER — LORAZEPAM 2 MG/ML IJ SOLN
0.5000 mg | INTRAMUSCULAR | Status: DC | PRN
Start: 2014-11-30 — End: 2014-12-01
  Administered 2014-11-30: 0.5 mg via INTRAVENOUS
  Filled 2014-11-30 (×2): qty 1

## 2014-11-30 MED ORDER — WARFARIN SODIUM 5 MG PO TABS
5.0000 mg | ORAL_TABLET | Freq: Once | ORAL | Status: AC
  Filled 2014-11-30: qty 1

## 2014-11-30 MED ORDER — WARFARIN - PHARMACIST DOSING INPATIENT: Freq: Every day | Status: DC

## 2014-11-30 NOTE — Progress Notes (Signed)
Pt lung sounds still with rrales and rhonchi, pt c/o being cold with chills, temp is 98.5  Pt has HX of Parkinson's, this more pronounced pt desating on 3 L. Lips are cyanotic paged Daphane ShepherdM Lynch  Who ordered ABG, Chest xray and Bipap, Daughter [name] call to notify will continue to monitor

## 2014-11-30 NOTE — Progress Notes (Signed)
Patient ID: Leah [name], female   DOB: 11/21/1934, 80 y.o.   MRN: [id number]  [facility] PROGRESS NOTE  Leah [name] WUJ:[id number] DOB: 03/30/1935 DOA: 11/29/2014 PCP: Ezequiel KayserPERINI,MARK A, MD   Brief narrative:    80 y.o. female with chronic diastolic heart failure, atrial fibrillation on anticoagulation, Parkinson's disease, hypertension who presented with main concern of progressive weakness, dyspnea with exertion and rest, fevers, malaise. In ED, pt noted to be lethargic, hypoxic with oxygen saturation in 80's on RA, T 102.43F, CXR with right side PNA. TRH asked to admit to SDU for management of severe sepsis secondary to PNA.  Assessment/Plan:    . Severe sepsis: Secondary to pneumonia and right leg cellulitis. Continue IV fluid resuscitation and empiric vancomycin, aztreonam. Follow culture data. Family leaning more towards comfort care, desire no further blood work and would like to meet with palliative care team. They still want us to continue ABX for now.   Marland Kitchen. PNA (pneumonia): Will continue with vancomycin, aztreonam for now. Follow clinical course and place order for palliative care consultation.   . Elevated troponins: family asking not to do more blood testing to ensure comfort. Elevated trop likely from demand ischemia in the setting of sepsis from PNA.  Marland Kitchen. Acute respiratory failure-hypoxic: Likely secondary to above. Treat underlying pneumonia, oxygen as needed for now. Allow morphine as needed to ensure comfort.   . Cellulitis of right leg: Will be on vancomycin that should be sufficient in this case. On Coumadin with a therapeutic INR-therefore low suspicion for DVT.   Marland Kitchen. HYPERTENSION, BENIGN: Blood pressure currently soft-hold all antihypertensive medications.   . Acute on Chronic kidney disease stage IV: family asking not to get additional blood work to ensure more comfort until family makes additional decisions.   . Chronic diastolic heart failure: Suspect mostly  compensated-has some mild lower extremity edema that appears to be chronic-mostly related to chronic venous stasis issues.  . Atrial fibrillation: Holding atenolol. Continue Coumadin per pharmacy.  Marland Kitchen. PPM-[employer]: family asked to take off cardiac monitor  . Palliative care/ethics: PCT consulted  DVT Prophylaxis: On coumadin  DVT prophylaxis - on Coumadin   Code Status: DNR Family Communication:  plan of care discussed with family at bedside  Disposition Plan: Keep in SDU until family meets with palliative care team   IV access:  Peripheral IV  Procedures and diagnostic studies:    Dg Chest Port 1 View  11/30/2014    Mild worsening of the bilateral airspace opacities.   Dg Chest Port 1 View  11/29/2014  Airspace opacities noted in the upper lobes, right greater than left, consistent with multifocal pneumonia. Findings could potentially reflect asymmetric edema.    Medical Consultants:  PCT  Other Consultants:  None  IAnti-Infectives:   Vancomycin 3/19 --> Aztreonam 3/19 -->   Debbora PrestoMAGICK-Addisen Chappelle, MD  [facility] Pager [phone]  If 7PM-7AM, please contact night-coverage [email or web address] Password [email or web address] 11/30/2014, 9:18 AM   LOS: 1 day   HPI/Subjective: No events overnight.   Objective: Filed Vitals:   11/29/14 2354 11/30/14 0145 11/30/14 0230 11/30/14 0330  BP: 108/49 163/116  109/60  Pulse: 60 84 108 65  Temp: 98.1 F (36.7 C) 98.5 F (36.9 C)  98.4 F (36.9 C)  TempSrc: Oral Oral  Oral  Resp: 19 37 36 28  Height:      Weight:      SpO2: 94% 82% 96% 98%    Intake/Output Summary (Last 24 hours) at 11/30/14 [id number] Last data filed  at 11/30/14 0600  Gross per 24 hour  Intake   1685 ml  Output    300 ml  Net   1385 ml    Exam:   General:  Pt is lethargic, NAD  Cardiovascular: Paced rhythm, no rubs, no gallops  Respiratory: Course breath sounds bilaterally with rhonchi   Abdomen: Soft, non tender, non distended, bowel sounds present, no  guarding  Extremities: LLE edema and erythema  Data Reviewed: Basic Metabolic Panel:  Recent Labs Lab 11/29/14 1343 11/30/14 0557  NA 138 138  K 3.2* 3.8  CL 105 104  CO2 22 23  GLUCOSE 182* 173*  BUN 37* 44*  CREATININE 1.52* 1.77*  CALCIUM 8.8 8.2*   Liver Function Tests:  Recent Labs Lab 11/29/14 1343  AST 59*  ALT 16  ALKPHOS 66  BILITOT 1.4*  PROT 5.9*  ALBUMIN 2.8*    Recent Labs Lab 11/29/14 1343  LIPASE 18   CBC:  Recent Labs Lab 11/29/14 1343 11/30/14 0557  WBC 13.9* 13.9*  NEUTROABS 13.1*  --   HGB 11.1* 10.7*  HCT 34.9* 33.9*  MCV 83.7 83.5  PLT 208 190   Cardiac Enzymes:  Recent Labs Lab 11/29/14 1343 11/29/14 1915 11/30/14 0026 11/30/14 0557  TROPONINI 0.29* 0.23* 0.21* 0.53*   CBG:  Recent Labs Lab 11/29/14 2137  GLUCAP 292*    Recent Results (from the past 240 hour(s))  Clostridium Difficile by PCR     Status: None   Collection Time: 11/29/14  1:37 PM  Result Value Ref Range Status   C difficile by pcr NEGATIVE NEGATIVE Final  Blood Culture (routine x 2)     Status: None (Preliminary result)   Collection Time: 11/29/14  1:45 PM  Result Value Ref Range Status   Specimen Description BLOOD HAND LEFT  Final   Special Requests BOTTLES DRAWN AEROBIC AND ANAEROBIC 5 CC  Final   Culture   Final           BLOOD CULTURE RECEIVED NO GROWTH TO DATE CULTURE WILL BE HELD FOR 5 DAYS BEFORE ISSUING A FINAL NEGATIVE REPORT Performed at [facility]    Report Status PENDING  Incomplete  Blood Culture (routine x 2)     Status: None (Preliminary result)   Collection Time: 11/29/14  1:54 PM  Result Value Ref Range Status   Specimen Description BLOOD RIGHT ARM  Final   Special Requests BOTTLES DRAWN AEROBIC AND ANAEROBIC 5 CC  Final   Culture   Final           BLOOD CULTURE RECEIVED NO GROWTH TO DATE CULTURE WILL BE HELD FOR 5 DAYS BEFORE ISSUING A FINAL NEGATIVE REPORT Performed at [facility]    Report Status  PENDING  Incomplete  MRSA PCR Screening     Status: None   Collection Time: 11/29/14  5:37 PM  Result Value Ref Range Status   MRSA by PCR NEGATIVE NEGATIVE Final     Scheduled Meds: . acidophilus   Oral Daily  . aztreonam  1 g Intravenous 3 times per day  . carbidopa-levodopa  1 tablet Oral TID  . digoxin  0.125 mg Oral Daily  . insulin aspart  0-9 Units Subcutaneous TID WC  . insulin glargine  8 Units Subcutaneous QHS  . [START ON 12/01/2014] levofloxacin (LEVAQUIN) IV  750 mg Intravenous Q48H  . sodium chloride  3 mL Intravenous Q12H  . vancomycin  1,250 mg Intravenous Q24H   Continuous Infusions: .  sodium chloride 100 mL/hr at 11/30/14 0600

## 2014-11-30 NOTE — Progress Notes (Signed)
Orders received for ABG.  Patient with increased WOB and significant shaking/tremors secondary to Parkinson's.  Attempted abg one time unsuccessfully, due to shaking.  No further attempts were made.  Patient placed on BIPAP, tolerated well some improvement in WOB.  BBS crackles throughout.  RN at bedside.

## 2014-11-30 NOTE — Progress Notes (Signed)
PT Cancellation Note  Patient Details Name: Chanda BusingMennie S Gibas MRN: [id number] DOB: 11/21/1934   Cancelled Treatment:    Reason Eval/Treat Not Completed: Fatigue/lethargy limiting ability to participate;Other (comment)    Orders received, chart reviewed, discussed pt status with Maggie, RN;  Lethargic this am, with noted difficulty maintaining oxygenation;   At this point, Physical Therapy will be too taxing;   Per RN, it is likely time to consider End of Life goals;  Will sign off at this time, and be happy to re-consult with new PT orders if PT needs are identified;  Thank you,  Van ClinesHolly Johnhenry Tippin, PT  Acute Rehabilitation Services Pager [phone] Office [phone]    Van ClinesGarrigan, Quantavious Eggert [facility] Center[facility] 11/30/2014, 8:59 AM

## 2014-11-30 NOTE — Progress Notes (Addendum)
Called [employer] on Pt, family & Dr request. [employer] on 'turns off' defib, or defb/pacers, not pacers only. Explained to family.  Pt does not wish to be re-positioned, favors 'sitting up a little';  will continue to monitor Pt & support family.

## 2014-11-30 NOTE — Progress Notes (Signed)
ANTICOAGULATION CONSULT NOTE - Initial Consult  Pharmacy Consult for Warfarin Indication: atrial fibrillation  Allergies  Allergen Reactions  . Diphenoxylate-Atropine Other (See Comments)    Unknown reaction  . Penicillins Hives and Itching    Patient Measurements: Height: 5\' 4"  (162.6 cm) Weight: 238 lb (107.956 kg) IBW/kg (Calculated) : 54.7  Vital Signs: Temp: 98.4 F (36.9 C) (03/20 0330) Temp Source: Oral (03/20 0330) BP: 109/60 mmHg (03/20 0330) Pulse Rate: 65 (03/20 0330)  Labs:  Recent Labs  11/29/14 1343 11/29/14 1915 11/30/14 0026 11/30/14 0557  HGB 11.1*  --   --  10.7*  HCT 34.9*  --   --  33.9*  PLT 208  --   --  190  APTT 47*  --   --   --   LABPROT 28.0*  --   --  30.8*  INR 2.60*  --   --  2.93*  CREATININE 1.52*  --   --  1.77*  TROPONINI 0.29* 0.23* 0.21* 0.53*    Estimated Creatinine Clearance: 31.4 mL/min (by C-G formula based on Cr of 1.77).   Medical History: Past Medical History  Diagnosis Date  . Type 2 diabetes mellitus 1972  . Hyperlipidemia   . HTN (hypertension)   . Morbid obesity   . AF (atrial fibrillation)   . Osteoporosis   . Vitamin B12 deficiency   . Low back pain   . Cellulitis of foot, right   . Diabetic retinopathy   . Ankle fracture, right 1972    Medications:  Warfarin PTA dose: 5mg  daily except 7.5mg  on Tuesdays and Thursdays  Assessment: 80 yo female with history of afib on warfarin PTA. Pharmacy consulted to continue dosing warfarin. INR therapeutic on admit 3/19 at 2.6 (patient had already taken dose prior to arrival). INR today still therapeutic at 2.93, will continue home dosing. Hgb 10.7, plt 190, no s/sx bleeding.  Goal of Therapy:  INR 2-3 Monitor platelets by anticoagulation protocol: Yes   Plan:  - Warfarin 5mg  po x1 - Daily INR - Will monitor s/sx bleeding  Megan E. Supple, Pharm.D Clinical Pharmacy Resident Pager: [phone] 11/30/2014 11:46 AM

## 2014-11-30 NOTE — Progress Notes (Signed)
Chaplain responded to request for spiritual support for pt. And family.  Spiritual conversation, prayer at bedside.  Will return if needed.  Rev. HallidayJan Hill, IowaChaplain [phone]

## 2014-11-30 NOTE — Progress Notes (Signed)
Family at the bedside pt not tolerating  Bipap, is removing 100% Non re breather, pt placed on 6 liters via nasal cannula saturations are holding low to mid 90's, respirations have increased from low 20's to 30's. Pt denies distress will continue to monitor

## 2014-11-30 NOTE — Progress Notes (Signed)
Subjective/Objective Acute dyspnea w/tachypnea.   Physical exam: General: alert, oriented - moderate respiratory distress Lungs: coarse rales bilaterally Heart: Tachycardic rate  Scheduled Meds: . acidophilus   Oral Daily  . aztreonam  1 g Intravenous 3 times per day  . carbidopa-levodopa  1 tablet Oral TID  . digoxin  0.125 mg Oral Daily  . insulin aspart  0-9 Units Subcutaneous TID WC  . insulin glargine  8 Units Subcutaneous QHS  . [START ON 12/01/2014] levofloxacin (LEVAQUIN) IV  750 mg Intravenous Q48H  . sodium chloride  3 mL Intravenous Q12H  . vancomycin  1,250 mg Intravenous Q24H   Continuous Infusions: . sodium chloride 100 mL/hr at 11/29/14 2200   PRN Meds:acetaminophen **OR** acetaminophen, albuterol, guaiFENesin-dextromethorphan, LORazepam, morphine injection, ondansetron **OR** ondansetron (ZOFRAN) IV  Vital signs in last 24 hours: Temp:  [98.1 F (36.7 C)-102.6 F (39.2 C)] 98.5 F (36.9 C) (03/20 0145) Pulse Rate:  [36-108] 108 (03/20 0230) Resp:  [16-37] 36 (03/20 0230) BP: (85-163)/(36-116) 163/116 mmHg (03/20 0145) SpO2:  [82 %-96 %] 96 % (03/20 0230) FiO2 (%):  [2 %] 2 % (03/19 1713) Weight:  [107.956 kg (238 lb)] 107.956 kg (238 lb) (03/19 1249)  Intake/Output last 3 shifts: I/O last 3 completed shifts: In: 340 [P.O.:240; I.V.:100] Out: -  Intake/Output this shift: Total I/O In: 425 [I.V.:425] Out: -   Problem Assessment/Plan  Acute respiratory failure Respiratory unable to obtained ABG. Placed on NRB. Ativan prn for anxiety. Also added prn IV Morphine. Family notified of patient's status and at bedside. Received Lasix 20 mg OTO.

## 2014-11-30 NOTE — Progress Notes (Signed)
SLP Cancellation Note  Patient Details Name: Leah [name] MRN: [id number] DOB: 09/15/1934   Cancelled treatment:       Reason Eval/Treat Not Completed: Fatigue/lethargy limiting ability to participate> Per RN, pt not responsive, difficulty maintaining oxygenation.  SLP to sign off at this time.   Blenda MountsCouture, [name] [name] 11/30/2014, 9:21 AM

## 2014-12-01 ENCOUNTER — Inpatient Hospital Stay (HOSPITAL_COMMUNITY): Payer: Medicare Other

## 2014-12-01 DIAGNOSIS — R531 Weakness: Secondary | ICD-10-CM

## 2014-12-01 DIAGNOSIS — L03115 Cellulitis of right lower limb: Secondary | ICD-10-CM

## 2014-12-01 DIAGNOSIS — R0600 Dyspnea, unspecified: Secondary | ICD-10-CM

## 2014-12-01 DIAGNOSIS — Z66 Do not resuscitate: Secondary | ICD-10-CM

## 2014-12-01 DIAGNOSIS — Z515 Encounter for palliative care: Secondary | ICD-10-CM

## 2014-12-01 LAB — HIV ANTIBODY (ROUTINE TESTING W REFLEX): HIV SCREEN 4TH GENERATION: NONREACTIVE

## 2014-12-01 MED ORDER — ASPIRIN 325 MG PO TABS
325.0000 mg | ORAL_TABLET | Freq: Every day | ORAL | Status: DC
  Administered 2014-12-02 – 2014-12-05 (×4): 325 mg via ORAL
  Filled 2014-12-01 (×5): qty 1

## 2014-12-01 MED ORDER — MORPHINE SULFATE (CONCENTRATE) 10 MG/0.5ML PO SOLN
5.0000 mg | ORAL | Status: DC | PRN
  Administered 2014-12-01 – 2014-12-04 (×6): 5 mg via ORAL
  Filled 2014-12-01 (×6): qty 0.5

## 2014-12-01 MED ORDER — LORAZEPAM 1 MG PO TABS: 1.0000 mg | ORAL_TABLET | ORAL | Status: DC | PRN

## 2014-12-01 MED ORDER — LEVOFLOXACIN 750 MG PO TABS
750.0000 mg | ORAL_TABLET | ORAL | Status: DC
  Administered 2014-12-01 – 2014-12-03 (×2): 750 mg via ORAL
  Filled 2014-12-01 (×3): qty 1

## 2014-12-01 NOTE — Progress Notes (Signed)
Patient ID: Leah [name] Thurmond, female   DOB: 02/13/1935, 80 y.o.   MRN: [id number]  [facility] PROGRESS NOTE  Leah [name] Labrie WUJ:[id number] DOB: 07/02/1935 DOA: 11/29/2014 PCP: Ezequiel KayserPERINI,MARK A, MD   Brief narrative:    80 y.o. female with chronic diastolic heart failure, atrial fibrillation on anticoagulation, Parkinson'[name] disease, hypertension who presented with main concern of progressive weakness, dyspnea with exertion and rest, fevers, malaise. In ED, pt noted to be lethargic, hypoxic with oxygen saturation in 80'[name] on RA, T 102.60F, CXR with right side PNA. TRH asked to admit to SDU for management of severe sepsis secondary to PNA.  Assessment/Plan:    . Severe sepsis: Secondary to pneumonia and right leg cellulitis. Pt initially started on empiric vancomycin, aztreonam. Pt is actually looking better this AM. Family insisting on ensuring comfort, avoiding blood work and needle sticks. Want to stop IV ABX and continue with oral regimen only. Will place on Levaquin.  Marland Kitchen. PNA (pneumonia): Will continue Levaquin as noted above, appreciate PCT input.   . Elevated troponins: family asking not to do more blood testing to ensure comfort. Elevated trop likely from demand ischemia in the setting of sepsis from PNA. Family and pt made aware of blood test and desire no interventions even if troponins continue trending up.   . Acute respiratory failure-hypoxic: Likely secondary to above. Treat underlying pneumonia, oxygen as needed for now. Allow morphine as needed to ensure comfort.   . Cellulitis of right leg: Will be on Levaquin as noted above. Family wants to minimize medical regimen to ensure comfort.   Marland Kitchen. HYPERTENSION, BENIGN: Blood pressure currently soft-hold all antihypertensive medications.   . Acute on Chronic kidney disease stage IV: family asking not to get additional blood work to ensure more comfort until family makes additional decisions.   . Chronic diastolic heart failure:  Suspect mostly compensated-has some mild lower extremity edema that appears to be chronic-mostly related to chronic venous stasis issues.  . Atrial fibrillation: Holding atenolol. Continue Coumadin per pharmacy.  Marland Kitchen. PPM-Medtronic: family asked to take off cardiac monitor  . Palliative care/ethics: PCT input appreciated   Severe PCM - pt more alert this AM, advance diet as pt able to tolerate   DVT prophylaxis - on Coumadin   Code Status: DNR Family Communication:  plan of care discussed with family at bedside  Disposition Plan: Keep in SDU today, possible transfer to med floor in AM  IV access:  Peripheral IV  Procedures and diagnostic studies:    Dg Chest Port 1 View  11/30/2014    Mild worsening of the bilateral airspace opacities.   Dg Chest Port 1 View  11/29/2014  Airspace opacities noted in the upper lobes, right greater than left, consistent with multifocal pneumonia. Findings could potentially reflect asymmetric edema.    Medical Consultants:  PCT  Other Consultants:  None  IAnti-Infectives:   Vancomycin 3/19 --> 3/21 Aztreonam 3/19 --> 3/21 Levaquin 3/21 -->   Debbora PrestoMAGICK-Latecia Miler, MD  [facility] Pager [phone]  If 7PM-7AM, please contact night-coverage [email or web address] Password [email or web address] 12/01/2014, 4:10 PM   LOS: 2 days   HPI/Subjective: No events overnight.   Objective: Filed Vitals:   11/30/14 0230 11/30/14 0330 11/30/14 1930 12/01/14 1300  BP:  109/60 137/61   Pulse: 108 65 83   Temp:  98.4 F (36.9 C) 98 F (36.7 C) 98.7 F (37.1 C)  TempSrc:  Oral Oral Oral  Resp: 36 28 30   Height:      Weight:  SpO2: 96% 98% 92%     Intake/Output Summary (Last 24 hours) at 12/01/14 1610 Last data filed at 11/30/14 2000  Gross per 24 hour  Intake    120 ml  Output      0 ml  Net    120 ml    Exam:   General:  Pt is more alert this AM   Cardiovascular: Paced rhythm, no rubs, no gallops  Respiratory: Course breath sounds bilaterally with minimal rhonchi  at bases, no tachypnea   Abdomen: Soft, non tender, non distended, bowel sounds present, no guarding  Extremities: LLE edema and erythema  Data Reviewed: Basic Metabolic Panel:  Recent Labs Lab 11/29/14 1343 11/30/14 0557  NA 138 138  K 3.2* 3.8  CL 105 104  CO2 22 23  GLUCOSE 182* 173*  BUN 37* 44*  CREATININE 1.52* 1.77*  CALCIUM 8.8 8.2*   Liver Function Tests:  Recent Labs Lab 11/29/14 1343  AST 59*  ALT 16  ALKPHOS 66  BILITOT 1.4*  PROT 5.9*  ALBUMIN 2.8*    Recent Labs Lab 11/29/14 1343  LIPASE 18   CBC:  Recent Labs Lab 11/29/14 1343 11/30/14 0557  WBC 13.9* 13.9*  NEUTROABS 13.1*  --   HGB 11.1* 10.7*  HCT 34.9* 33.9*  MCV 83.7 83.5  PLT 208 190   Cardiac Enzymes:  Recent Labs Lab 11/29/14 1343 11/29/14 1915 11/30/14 0026 11/30/14 0557  TROPONINI 0.29* 0.23* 0.21* 0.53*   CBG:  Recent Labs Lab 11/29/14 2137  GLUCAP 292*    Recent Results (from the past 240 hour([name]))  Clostridium Difficile by PCR     Status: None   Collection Time: 11/29/14  1:37 PM  Result Value Ref Range Status   C difficile by pcr NEGATIVE NEGATIVE Final  Blood Culture (routine x 2)     Status: None (Preliminary result)   Collection Time: 11/29/14  1:45 PM  Result Value Ref Range Status   Specimen Description BLOOD HAND LEFT  Final   Special Requests BOTTLES DRAWN AEROBIC AND ANAEROBIC 5 CC  Final   Culture   Final           BLOOD CULTURE RECEIVED NO GROWTH TO DATE CULTURE WILL BE HELD FOR 5 DAYS BEFORE ISSUING A FINAL NEGATIVE REPORT Performed at [facility]    Report Status PENDING  Incomplete  Blood Culture (routine x 2)     Status: None (Preliminary result)   Collection Time: 11/29/14  1:54 PM  Result Value Ref Range Status   Specimen Description BLOOD RIGHT ARM  Final   Special Requests BOTTLES DRAWN AEROBIC AND ANAEROBIC 5 CC  Final   Culture   Final           BLOOD CULTURE RECEIVED NO GROWTH TO DATE CULTURE WILL BE HELD FOR 5 DAYS  BEFORE ISSUING A FINAL NEGATIVE REPORT Performed at [facility]    Report Status PENDING  Incomplete  MRSA PCR Screening     Status: None   Collection Time: 11/29/14  5:37 PM  Result Value Ref Range Status   MRSA by PCR NEGATIVE NEGATIVE Final     Scheduled Meds: . aspirin  325 mg Oral Daily  . aztreonam  1 g Intravenous 3 times per day  . carbidopa-levodopa  1 tablet Oral TID  . digoxin  0.125 mg Oral Daily  . sodium chloride  3 mL Intravenous Q12H  . vancomycin  1,250 mg Intravenous Q24H  . warfarin  5  mg Oral ONCE-1800  . Warfarin - Pharmacist Dosing Inpatient   Does not apply q1800   Continuous Infusions:

## 2014-12-01 NOTE — Progress Notes (Signed)
ANTICOAGULATION CONSULT NOTE - Follow Up Consult  Pharmacy Consult for warfarin Indication: atrial fibrillation  Allergies  Allergen Reactions  . Diphenoxylate-Atropine Other (See Comments)    Unknown reaction  . Penicillins Hives and Itching    Patient Measurements: Height: 5\' 4"  (162.6 cm) Weight: 238 lb (107.956 kg) IBW/kg (Calculated) : 54.7 Vital Signs: Temp: 98.7 F (37.1 C) (03/21 1300) Temp Source: Oral (03/21 1300)  Labs:  Recent Labs  11/29/14 1343 11/29/14 1915 11/30/14 0026 11/30/14 0557  HGB 11.1*  --   --  10.7*  HCT 34.9*  --   --  33.9*  PLT 208  --   --  190  APTT 47*  --   --   --   LABPROT 28.0*  --   --  30.8*  INR 2.60*  --   --  2.93*  CREATININE 1.52*  --   --  1.77*  TROPONINI 0.29* 0.23* 0.21* 0.53*    Estimated Creatinine Clearance: 31.4 mL/min (by C-G formula based on Cr of 1.77).  Assessment: 80 year old female on chronic warfarin to atrial fibrillation. INR was up to 2.93 yesterday. Patient is currently refusing all lab draws.   Home dose was 5mg  daily and 7.5mg  Tuesday and Thursday.    Goal of Therapy:  INR 2-3 Monitor platelets by anticoagulation protocol: Yes   Plan:  Discussed case with Lorinda CreedMary Larach, NP (Palliative Care) - holding warfarin today and giving aspirin only.  Follow-up for plan tomorrow.   Link SnufferJessica Keira Bohlin, PharmD, BCPS Clinical Pharmacist [phone] 12/01/2014,2:43 PM

## 2014-12-01 NOTE — Progress Notes (Signed)
ANTIBIOTIC CONSULT NOTE - INITIAL  Pharmacy Consult for Levaquin Indication: pneumonia  Allergies  Allergen Reactions  . Diphenoxylate-Atropine Other (See Comments)    Unknown reaction  . Penicillins Hives and Itching    Patient Measurements: Height:  (162.6 cm) Weight: 238 lb (107.956 kg) IBW/kg (Calculated) : 54.7 Adjusted Body Weight: n/a  Vital Signs: Temp: 98.7 F (37.1 C) (03/21 1300) Temp Source: Oral (03/21 1300) Intake/Output from previous day: 03/20 0701 - 03/21 0700 In: 120 [P.O.:120] Out: -  Intake/Output from this shift:    Labs:  Recent Labs  11/29/14 1343 11/30/14 0557  WBC 13.9* 13.9*  HGB 11.1* 10.7*  PLT 208 190  CREATININE 1.52* 1.77*   Estimated Creatinine Clearance: 31.4 mL/min (by C-G formula based on Cr of 1.77). No results for input(s): VANCOTROUGH, VANCOPEAK, VANCORANDOM, GENTTROUGH, GENTPEAK, GENTRANDOM, TOBRATROUGH, TOBRAPEAK, TOBRARND, AMIKACINPEAK, AMIKACINTROU, AMIKACIN in the last 72 hours.   Microbiology: Recent Results (from the past 720 hour(s))  Clostridium Difficile by PCR     Status: None   Collection Time: 11/29/14  1:37 PM  Result Value Ref Range Status   C difficile by pcr NEGATIVE NEGATIVE Final  Blood Culture (routine x 2)     Status: None (Preliminary result)   Collection Time: 11/29/14  1:45 PM  Result Value Ref Range Status   Specimen Description BLOOD HAND LEFT  Final   Special Requests BOTTLES DRAWN AEROBIC AND ANAEROBIC 5 CC  Final   Culture   Final           BLOOD CULTURE RECEIVED NO GROWTH TO DATE CULTURE WILL BE HELD FOR 5 DAYS BEFORE ISSUING A FINAL NEGATIVE REPORT Performed at [facility]    Report Status PENDING  Incomplete  Blood Culture (routine x 2)     Status: None (Preliminary result)   Collection Time: 11/29/14  1:54 PM  Result Value Ref Range Status   Specimen Description BLOOD RIGHT ARM  Final   Special Requests BOTTLES DRAWN AEROBIC AND ANAEROBIC 5 CC  Final   Culture   Final            BLOOD CULTURE RECEIVED NO GROWTH TO DATE CULTURE WILL BE HELD FOR 5 DAYS BEFORE ISSUING A FINAL NEGATIVE REPORT Performed at [facility]    Report Status PENDING  Incomplete  Urine culture     Status: None   Collection Time: 11/29/14  2:04 PM  Result Value Ref Range Status   Specimen Description URINE, CATHETERIZED  Final   Special Requests NONE  Final   Colony Count NO GROWTH Performed at [facility]   Final   Culture NO GROWTH Performed at [facility]   Final   Report Status 11/30/2014 FINAL  Final  MRSA PCR Screening     Status: None   Collection Time: 11/29/14  5:37 PM  Result Value Ref Range Status   MRSA by PCR NEGATIVE NEGATIVE Final    Comment:        The GeneXpert MRSA Assay (FDA approved for NASAL specimens only), is one component of a comprehensive MRSA colonization surveillance program. It is not intended to diagnose MRSA infection nor to guide or monitor treatment for MRSA infections.     Medical History: Past Medical History  Diagnosis Date  . Type 2 diabetes mellitus 1972  . Hyperlipidemia   . HTN (hypertension)   . Morbid obesity   . AF (atrial fibrillation)   . Osteoporosis   . Vitamin B12 deficiency   .  Low back pain   . Cellulitis of foot, right   . Diabetic retinopathy   . Ankle fracture, right 1972   Assessment: 80 yo female with CAP.  Pharmacy asked to switch vancomycin and aztreonam to po Levaquin today.  Scr 1.77, est CrCl ~ 31 ml/min.  Cultures negative to date.  Goal of Therapy:  Resolution of infection  Plan:  1. Levaquin 750 mg IV q 48 hrs. 2. Monitor renal function.  Tad MooreJessica Mamadou Breon, Pharm D, BCPS  Clinical Pharmacist Pager [phone]  12/01/2014 4:33 PM

## 2014-12-01 NOTE — Consult Note (Signed)
Patient WU:[id number]:Leah [name]      DOB: 08/26/1935      YNW:[id number]     [facility]    Consult Requested by: Dr Izola PriceMyers     PCP: Ezequiel KayserPERINI,MARK A, MD Reason for Consultation:  Clarification of GOC and options    Phone Number:[phone]  Assessment of patients Current state:   Continued slow physical and functional decline over the past several years 2/2 multiple co-morbidities CHF, A-fib, Parkinson's disease, HTN, presented with progressive weakness, dyspnea, and lethargy.    In ED, pt noted to be lethargic, hypoxic with oxygen saturation in 80's on RA, T 102.49F, CXR with right side PNA.  Admitted for management of severe sepsis 2/2 to PNA.     Patient has verbalized her strong request to have limited medical interventions, understanding it may result in shortened life expectancy.  Family supports, "what mamma says, goes".    Patient and family faced with advanced directive decisions and anticipatory care needs.   Consult is for review of medical treatment options, clarification of goals of care and end of life issues, disposition and options, and symptom recommendation.  This NP Lorinda CreedMary Jalyric Kaestner reviewed medical records, received report from team, assessed the patient and then meet at the patient's bedside along with her daughter/Sue Alvino ChapelEllen, son/Russell, grand-daughter/ Inetta Fermoina to discuss diagnosis prognosis, GOC, EOL wishes disposition and options.  A detailed discussion was had today regarding advanced directives.  Concepts specific to code status, artifical feeding and hydration, continued IV antibiotics and rehospitalization was had.  The difference between a aggressive medical intervention path  and a palliative comfort care path for this patient at this time was had.  Values and goals of care important to patient and family were attempted to be elicited.  Concept of Hospice and Palliative Care were discussed  Natural trajectory and  expectations at EOL were discussed.  Questions and concerns addressed.  . Family encouraged to call with questions or concerns.  PMT will continue to support holistically.   Goals of Care: 1.  Code Status: DNR/DNI-comfort is main focus of care and respect of personal wishes   2. Scope of Treatment: 1. Vital Signs: per unit 2. Respiratory/Oxygen:as needed for comfort 3. Nutritional Support/Tube Feeds:no artificial feeding now or in the future 4. Antibiotics: oral only/ Dr Izola PriceMyers to convert IV meds to oral  5. IVF: patient does not want lab draws at this time 6. Review of Medications        -Hold coumadin for now/patient is refusing labs-start ASA 325 mg po daily        -utilize prn for symptom managemetn 7. Labs: none   3. Disposition: Pending outcomes, patient and family strongly verbalize desire to "avoid SNF at all costs even for a short time".. Will f/u in morning and help family navigate their options.  5. Psychosocial:  Emotional support offered to patient and her family, patient was able to share her love for her family and again the importance on not  "putting myself through all this, my time is coming"  6. Spiritual:  Strong community church support, chaplain at bedside   Patient Documents Completed or Given: Document Given Completed  Advanced Directives Pkt    MOST    DNR  X  Gone from My Sight    Hard Choices X     Brief HPI:  Continued slow physical and functional decline over the past several years 2/2 multiple co-morbidities CHF, A-fib, Parkinson's disease,  HTN, presented with progressive weakness, dyspnea, and lethargy.    In ED, pt noted to be lethargic, hypoxic with oxygen saturation in 80's on RA, T 102.54F, CXR with right side PNA.   Admitted for management of severe sepsis 2/2 to PNA.       ROS:   Weakness, generalized discomfort   PMH:  Past Medical History  Diagnosis Date  . Type 2 diabetes mellitus 1972  . Hyperlipidemia   . HTN (hypertension)    . Morbid obesity   . AF (atrial fibrillation)   . Osteoporosis   . Vitamin B12 deficiency   . Low back pain   . Cellulitis of foot, right   . Diabetic retinopathy   . Ankle fracture, right 1972     PSH: Past Surgical History  Procedure Laterality Date  . Cholecystectomy  1985  . Pacemaker insertion      [employer]tronic   I have reviewed the FH and SH and  If appropriate update it with new information. Allergies  Allergen Reactions  . Diphenoxylate-Atropine Other (See Comments)    Unknown reaction  . Penicillins Hives and Itching   Scheduled Meds: . aztreonam  1 g Intravenous 3 times per day  . carbidopa-levodopa  1 tablet Oral TID  . digoxin  0.125 mg Oral Daily  . sodium chloride  3 mL Intravenous Q12H  . vancomycin  1,250 mg Intravenous Q24H  . warfarin  5 mg Oral ONCE-1800  . Warfarin - Pharmacist Dosing Inpatient   Does not apply q1800   Continuous Infusions:  PRN Meds:.acetaminophen **OR** acetaminophen, albuterol, guaiFENesin-dextromethorphan, LORazepam, morphine injection, ondansetron **OR** ondansetron (ZOFRAN) IV    BP 137/61 mmHg  Pulse 83  Temp(Src) 98.7 F (37.1 C) (Oral)  Resp 30  Ht  (1.626 m)  Wt 107.956 kg (238 lb)  BMI 40.83 kg/m2  SpO2 92%   PPS:30 %   Intake/Output Summary (Last 24 hours) at 12/01/14 1316 Last data filed at 11/30/14 2000  Gross per 24 hour  Intake    120 ml  Output      0 ml  Net    120 ml    Physical Exam:  General: chronically ill appearing, dyspneic at rest HEENT:  Moist buccal membranes, no exudate Chest:   Decreased in bases CVS: RRR Abdomen: soft NT, +BS Ext: LLE with edema and erythema  Neuro:  Alert and oriented, fully engaged in conversation  Labs: CBC    Component Value Date/Time   WBC 13.9* 11/30/2014 0557   RBC 4.06 11/30/2014 0557   HGB 10.7* 11/30/2014 0557   HCT 33.9* 11/30/2014 0557   PLT 190 11/30/2014 0557   MCV 83.5 11/30/2014 0557   MCH 26.4 11/30/2014 0557   MCHC 31.6 11/30/2014  0557   RDW 16.4* 11/30/2014 0557   LYMPHSABS 0.3* 11/29/2014 1343   MONOABS 0.5 11/29/2014 1343   EOSABS 0.0 11/29/2014 1343   BASOSABS 0.0 11/29/2014 1343    BMET    Component Value Date/Time   NA 138 11/30/2014 0557   K 3.8 11/30/2014 0557   CL 104 11/30/2014 0557   CO2 23 11/30/2014 0557   GLUCOSE 173* 11/30/2014 0557   BUN 44* 11/30/2014 0557   CREATININE 1.77* 11/30/2014 0557   CALCIUM 8.2* 11/30/2014 0557   GFRNONAA 26* 11/30/2014 0557   GFRAA 31* 11/30/2014 0557    CMP     Component Value Date/Time   NA 138 11/30/2014 0557   K 3.8 11/30/2014 0557   CL 104  11/30/2014 0557   CO2 23 11/30/2014 0557   GLUCOSE 173* 11/30/2014 0557   BUN 44* 11/30/2014 0557   CREATININE 1.77* 11/30/2014 0557   CALCIUM 8.2* 11/30/2014 0557   PROT 5.9* 11/29/2014 1343   ALBUMIN 2.8* 11/29/2014 1343   AST 59* 11/29/2014 1343   ALT 16 11/29/2014 1343   ALKPHOS 66 11/29/2014 1343   BILITOT 1.4* 11/29/2014 1343   GFRNONAA 26* 11/30/2014 0557   GFRAA 31* 11/30/2014 0557     Time In Time Out Total Time Spent with Patient Total Overall Time  1130  1300 80 min 90 min    Greater than 50%  of this time was spent counseling and coordinating care related to the above assessment and plan.   Lorinda Creed NP  Palliative Medicine Team Team Phone # [phone] Pager [phone]  Discussed with Dr Izola Price and Dr Waynard Edwards

## 2014-12-01 NOTE — Progress Notes (Signed)
UR COMPLETED  

## 2014-12-01 NOTE — Progress Notes (Signed)
*  PRELIMINARY RESULTS* Vascular Ultrasound Right lower extremity venous duplex has been completed.  Preliminary findings: no evidence of DVT in visualized veins.   Farrel DemarkJill Eunice, RDMS, RVT  12/01/2014, 12:27 PM

## 2014-12-01 NOTE — Progress Notes (Signed)
Courtesy visit from PCP:  Events noted. Spoke at length with patient and multiple family members.   She re-affirms that she does not want very aggressive care. She is aware of how sick she is and i think she is competent to make the decision.  She agrees to try oral antibiotics and other basic Care and see how she does.  She does not want blood draws and this will be respected.   Appreciate all help from Hospitalist group and other consultants. Will follow peripherally.

## 2014-12-02 NOTE — Consult Note (Addendum)
WOC assistance requested for use of Interdry.  Discussed patient via phone with bedside nurse.  She describes skin folds as being red, macerated, and moist with partial thickness skin loss.  Appearance is consistent with intertrigo.  Interdry silver-impregnated fabric ordered for use by bedside nurses and instructions provided.  This product should remain in place for 5 days for optimal plan of care to provide antimicrobial benefits and wick moisture away from skin.  Please re-consult if further assistance is needed.  Thank-you,  Juvenal Umar MSN, RN, CWOCN, CWCN-AP, [phone]  

## 2014-12-02 NOTE — Progress Notes (Signed)
Patient ID: Leah [name], female   DOB: 1935-08-30, 80 y.o.   MRN: [id number]  [facility] PROGRESS NOTE  [name]:[id number] DOB: 1935-03-24 DOA: 11/29/2014 PCP: Ezequiel Kayser, MD   Brief narrative:    80 y.o. female with chronic diastolic heart failure, atrial fibrillation on anticoagulation, Parkinson's disease, hypertension who presented with main concern of progressive weakness, dyspnea with exertion and rest, fevers, malaise. In ED, pt noted to be lethargic, hypoxic with oxygen saturation in 80's on RA, T 102.31F, CXR with right side PNA. TRH asked to admit to SDU for management of severe sepsis secondary to PNA.  Assessment/Plan:    . Severe sepsis: Secondary to pneumonia and right leg cellulitis. Pt initially started on empiric vancomycin, aztreonam. Pt looking better this AM. Family insisting on ensuring comfort, avoiding blood work and needle sticks. IV ABX stopped per family and pt request, placed on oral Levaquin 3/21 and pt tolerating well so far.   Marland Kitchen PNA (pneumonia): Will continue Levaquin as noted above, appreciate PCT input.   . Elevated troponins: family asking not to do more blood testing to ensure comfort. Elevated trop likely from demand ischemia in the setting of sepsis from PNA. Family and pt made aware of blood test and desire no interventions even if troponins trending up.   . Acute respiratory failure-hypoxic: Likely secondary to above. Treat underlying pneumonia, oxygen as needed for now. Allow morphine as needed to ensure comfort.   . Cellulitis of right leg: Will be on Levaquin as noted above. Family wants to minimize medical regimen to ensure comfort.   Marland Kitchen HYPERTENSION, BENIGN: Blood pressure stable over the past 24 hours.   . Acute on Chronic kidney disease stage IV: family asking not to get additional blood work to ensure comfort. Will respect wishes.  . Chronic diastolic heart failure: Suspect mostly compensated-has some mild lower  extremity edema that appears to be chronic-mostly related to chronic venous stasis issues. Again, no further blood tests to ensure comfort.   . Atrial fibrillation: Holding atenolol. Continue Coumadin per pharmacy. Stopped telemetry per family request.   . PPM-[employer]: family asked to take off cardiac monitor  . Palliative care/ethics: PCT input appreciated   Severe PCM - pt more alert this AM, advance diet as pt able to tolerate   DVT prophylaxis - on Coumadin   Code Status: DNR Family Communication:  plan of care discussed with family at bedside  Disposition Plan: Transfer to medical floor   IV access:  Peripheral IV  Procedures and diagnostic studies:    Dg Chest Port 1 View  11/30/2014    Mild worsening of the bilateral airspace opacities.   Dg Chest Port 1 View  11/29/2014  Airspace opacities noted in the upper lobes, right greater than left, consistent with multifocal pneumonia. Findings could potentially reflect asymmetric edema.    Medical Consultants:  PCT  Other Consultants:  None  IAnti-Infectives:   Vancomycin 3/19 --> 3/21 Aztreonam 3/19 --> 3/21 Levaquin 3/21 -->  Debbora Presto, MD  [facility] Pager [phone]  If 7PM-7AM, please contact night-coverage [email or web address] Password [facility] 12/02/2014, 3:55 PM   LOS: 3 days   HPI/Subjective: No events overnight.   Objective: Filed Vitals:   11/30/14 1930 12/01/14 1300 12/01/14 1935 12/02/14 0800  BP: 137/61  138/71 130/78  Pulse: 83  82   Temp: 98 F (36.7 C) 98.7 F (37.1 C) 98.5 F (36.9 C)   TempSrc: Oral Oral Oral   Resp: 30  22  Height:      Weight:      SpO2: 92%  95%     Intake/Output Summary (Last 24 hours) at 12/02/14 1555 Last data filed at 12/01/14 1935  Gross per 24 hour  Intake    240 ml  Output      0 ml  Net    240 ml    Exam:   General:  Pt is more alert this AM   Cardiovascular: Paced rhythm, no rubs, no gallops  Respiratory: bilateral rhonchi with diminished breath  sounds at bases   Abdomen: Soft, non tender, non distended, bowel sounds present, no guarding  Extremities: LLE edema and erythema much improved   Data Reviewed: Basic Metabolic Panel:  Recent Labs Lab 11/29/14 1343 11/30/14 0557  NA 138 138  K 3.2* 3.8  CL 105 104  CO2 22 23  GLUCOSE 182* 173*  BUN 37* 44*  CREATININE 1.52* 1.77*  CALCIUM 8.8 8.2*   Liver Function Tests:  Recent Labs Lab 11/29/14 1343  AST 59*  ALT 16  ALKPHOS 66  BILITOT 1.4*  PROT 5.9*  ALBUMIN 2.8*    Recent Labs Lab 11/29/14 1343  LIPASE 18   CBC:  Recent Labs Lab 11/29/14 1343 11/30/14 0557  WBC 13.9* 13.9*  NEUTROABS 13.1*  --   HGB 11.1* 10.7*  HCT 34.9* 33.9*  MCV 83.7 83.5  PLT 208 190   Cardiac Enzymes:  Recent Labs Lab 11/29/14 1343 11/29/14 1915 11/30/14 0026 11/30/14 0557  TROPONINI 0.29* 0.23* 0.21* 0.53*   CBG:  Recent Labs Lab 11/29/14 2137  GLUCAP 292*    Recent Results (from the past 240 hour(s))  Clostridium Difficile by PCR     Status: None   Collection Time: 11/29/14  1:37 PM  Result Value Ref Range Status   C difficile by pcr NEGATIVE NEGATIVE Final  Blood Culture (routine x 2)     Status: None (Preliminary result)   Collection Time: 11/29/14  1:45 PM  Result Value Ref Range Status   Specimen Description BLOOD HAND LEFT  Final   Special Requests BOTTLES DRAWN AEROBIC AND ANAEROBIC 5 CC  Final   Culture   Final           BLOOD CULTURE RECEIVED NO GROWTH TO DATE CULTURE WILL BE HELD FOR 5 DAYS BEFORE ISSUING A FINAL NEGATIVE REPORT Performed at [facility]    Report Status PENDING  Incomplete  Blood Culture (routine x 2)     Status: None (Preliminary result)   Collection Time: 11/29/14  1:54 PM  Result Value Ref Range Status   Specimen Description BLOOD RIGHT ARM  Final   Special Requests BOTTLES DRAWN AEROBIC AND ANAEROBIC 5 CC  Final   Culture   Final           BLOOD CULTURE RECEIVED NO GROWTH TO DATE CULTURE WILL BE HELD FOR 5  DAYS BEFORE ISSUING A FINAL NEGATIVE REPORT Performed at [facility]    Report Status PENDING  Incomplete  MRSA PCR Screening     Status: None   Collection Time: 11/29/14  5:37 PM  Result Value Ref Range Status   MRSA by PCR NEGATIVE NEGATIVE Final     Scheduled Meds: . aspirin  325 mg Oral Daily  . carbidopa-levodopa  1 tablet Oral TID  . digoxin  0.125 mg Oral Daily  . levofloxacin  750 mg Oral Q48H  . sodium chloride  3 mL Intravenous Q12H  . Warfarin -  Pharmacist Dosing Inpatient   Does not apply q1800   Continuous Infusions:

## 2014-12-02 NOTE — Progress Notes (Signed)
Progress Note from the Palliative Medicine Team at [facility]  Assessment and Plan:  -patient is alert and oriented but extremely  weak   -daughter at bedside, continued discussion regarding GOC and options  -focus remains comfort, we disussed possible discharge options.  Family is hopeful to see how patient does "here in the hospital" before making a decisions.  It is unsure if family will be able to care for her at home  -weakness-pateint is requesting PT evaluation, will write order today    Objective: Allergies  Allergen Reactions  . Diphenoxylate-Atropine Other (See Comments)    Unknown reaction  . Penicillins Hives and Itching   Scheduled Meds: . aspirin  325 mg Oral Daily  . carbidopa-levodopa  1 tablet Oral TID  . digoxin  0.125 mg Oral Daily  . levofloxacin  750 mg Oral Q48H  . sodium chloride  3 mL Intravenous Q12H  . Warfarin - Pharmacist Dosing Inpatient   Does not apply q1800   Continuous Infusions:  PRN Meds:.acetaminophen **OR** acetaminophen, albuterol, guaiFENesin-dextromethorphan, LORazepam, morphine injection, morphine CONCENTRATE, ondansetron **OR** ondansetron (ZOFRAN) IV  BP 138/71 mmHg  Pulse 82  Temp(Src) 98.5 F (36.9 C) (Oral)  Resp 22  Ht  (1.626 m)  Wt 107.956 kg (238 lb)  BMI 40.83 kg/m2  SpO2 95%   PPS:30 %    Intake/Output Summary (Last 24 hours) at 12/02/14 1110 Last data filed at 12/01/14 1935  Gross per 24 hour  Intake    240 ml  Output      0 ml  Net    240 ml       Physical Exam:  General: chronically ill appearing,  HEENT:  Moist buccal membranes, Chest:   Decreased in bases,   CVS: RRR Abdomen: soft NT +BS  Labs: CBC    Component Value Date/Time   WBC 13.9* 11/30/2014 0557   RBC 4.06 11/30/2014 0557   HGB 10.7* 11/30/2014 0557   HCT 33.9* 11/30/2014 0557   PLT 190 11/30/2014 0557   MCV 83.5 11/30/2014 0557   MCH 26.4 11/30/2014 0557   MCHC 31.6 11/30/2014 0557   RDW 16.4* 11/30/2014 0557   LYMPHSABS  0.3* 11/29/2014 1343   MONOABS 0.5 11/29/2014 1343   EOSABS 0.0 11/29/2014 1343   BASOSABS 0.0 11/29/2014 1343    BMET    Component Value Date/Time   NA 138 11/30/2014 0557   K 3.8 11/30/2014 0557   CL 104 11/30/2014 0557   CO2 23 11/30/2014 0557   GLUCOSE 173* 11/30/2014 0557   BUN 44* 11/30/2014 0557   CREATININE 1.77* 11/30/2014 0557   CALCIUM 8.2* 11/30/2014 0557   GFRNONAA 26* 11/30/2014 0557   GFRAA 31* 11/30/2014 0557    CMP     Component Value Date/Time   NA 138 11/30/2014 0557   K 3.8 11/30/2014 0557   CL 104 11/30/2014 0557   CO2 23 11/30/2014 0557   GLUCOSE 173* 11/30/2014 0557   BUN 44* 11/30/2014 0557   CREATININE 1.77* 11/30/2014 0557   CALCIUM 8.2* 11/30/2014 0557   PROT 5.9* 11/29/2014 1343   ALBUMIN 2.8* 11/29/2014 1343   AST 59* 11/29/2014 1343   ALT 16 11/29/2014 1343   ALKPHOS 66 11/29/2014 1343   BILITOT 1.4* 11/29/2014 1343   GFRNONAA 26* 11/30/2014 0557   GFRAA 31* 11/30/2014 0557     Time In Time Out Total Time Spent with Patient Total Overall Time  1130 1205 35 min 35 min    Greater than  50%  of this time was spent counseling and coordinating care related to the above assessment and plan.  Lorinda CreedMary Larach NP  Palliative Medicine Team Team Phone # [phone] Pager [phone]        1

## 2014-12-02 NOTE — Progress Notes (Signed)
Attempted foley insertion with second RN-Rosemary Clark. Unable to place foley x 2. Dicussed at length the attempts with patient and family. They are understanding of not having foley placed and okay to continue use of bedpan. Will continue to monitor.

## 2014-12-02 NOTE — Progress Notes (Signed)
Report called to RN on 5W. Belongings packed and family aware of new room number.  Will continue to monitor until tx completed.

## 2014-12-03 ENCOUNTER — Inpatient Hospital Stay (HOSPITAL_COMMUNITY): Payer: Medicare Other

## 2014-12-03 MED ORDER — MUPIROCIN CALCIUM 2 % EX CREA
TOPICAL_CREAM | Freq: Every day | CUTANEOUS | Status: DC
  Administered 2014-12-03 – 2014-12-05 (×3): via TOPICAL
  Filled 2014-12-03: qty 15

## 2014-12-03 MED ORDER — SULFAMETHOXAZOLE-TRIMETHOPRIM 800-160 MG PO TABS
1.0000 | ORAL_TABLET | Freq: Two times a day (BID) | ORAL | Status: DC
  Administered 2014-12-03 – 2014-12-05 (×4): 1 via ORAL
  Filled 2014-12-03 (×5): qty 1

## 2014-12-03 MED ORDER — HYDROCERIN EX CREA: TOPICAL_CREAM | Freq: Two times a day (BID) | CUTANEOUS | Status: DC

## 2014-12-03 MED ORDER — HYDROCERIN EX CREA
TOPICAL_CREAM | Freq: Every day | CUTANEOUS | Status: DC
  Administered 2014-12-03 – 2014-12-05 (×3): via TOPICAL
  Filled 2014-12-03: qty 113

## 2014-12-03 NOTE — Consult Note (Addendum)
WOC wound consult note Reason for Consult: Consult requested for right foot wounds.  Pt has dry callous to inner plantar foot which is "followed by a foot Dr, she states, although she has not seen him in awhile." Right ankle area with dry scabbed peeling skin, no open wounds or drainage. Wound type: Right inner plantar foot with dry cracked yellow raised callous, 2X2cm.  Loose and peels away from skin easily, revealing full thickness wound 1X1X.5cm, 80% red and moist, small amt red drainage, no odor, 20% dry dark brownish-yellow callous remaining. Loose peeling skin surrounding wound tracks upward to another site which communicated underneath skin, which removes easily with scissors.  Appearance is consistent with a blister which has ruptured.  Another wound is near the plantar surface of foot near anterior foot; 1X1X.8cm.  Mod amt thick tan pus drained when probed with a swab, no odor or pain.  Bone palpable with swab. Moist dark red wound is revealed after drainage, small amt bleeding.   Pt denies c/o pain.  Pink moist skin revealed where previous skin peeled.  Dressing procedure/placement/frequency: Eucerin cream to calf area to assist with removal of dry cracked skin. Bactroban to provide antimicrobial benefits and promote moist healing.  Pt could benefit from x-ray to R/O osteomyelitis since there was pus and bone is palpable.  Please order if desired.   Please re-consult if further assistance is needed.  Thank-you,  Cammie Mcgeeawn Tyannah Sane MSN, RN, CWOCN, [place], CNS [phone]

## 2014-12-03 NOTE — Progress Notes (Addendum)
Patient ID: Leah [name], female   DOB: 12/06/1934, 80 y.o.   MRN: [id number]  [facility] PROGRESS NOTE  Leah BusingMennie S Fodor [id number] DOB: 10/23/1934 DOA: 11/29/2014 PCP: Ezequiel KayserPERINI,MARK A, MD   Brief narrative:    80 y.o. female with chronic diastolic heart failure, atrial fibrillation on anticoagulation, Parkinson's disease, hypertension who presented with main concern of progressive weakness, dyspnea with exertion and rest, fevers, malaise. In ED, pt noted to be lethargic, hypoxic with oxygen saturation in 80's on RA, T 102.28F, CXR with right side PNA. TRH asked to admit to SDU for management of severe sepsis secondary to PNA.  Assessment/Plan:    . Severe sepsis: Secondary to pneumonia and right leg cellulitis. Pt initially started on empiric vancomycin, aztreonam. Pt continues improving. Family insisting on ensuring comfort, avoiding blood work and needle sticks. Pt also wants to avoid any blood work and additional sticks. IV ABX stopped per family and pt request, placed on oral Levaquin 3/21 and pt tolerating well so far. Advance diet as pt able to tolerate.   Marland Kitchen. PNA (pneumonia): Will continue Levaquin as noted above, appreciate PCT input.   . Elevated troponins: family asking not to do more blood testing to ensure comfort. Elevated trop likely from demand ischemia in the setting of sepsis from PNA. Family and pt made aware of blood test and desire no interventions even if troponins trending up.   . Acute respiratory failure-hypoxic: Likely secondary to above. Treating underlying pneumonia, oxygen as needed for now. Allow morphine as needed to ensure comfort.   . Cellulitis of left leg: Will be on Levaquin as noted above. Family wants to minimize medical regimen to ensure comfort. Will ask wound care for consultation.   Marland Kitchen. HYPERTENSION, BENIGN: Blood pressure stable over the past 24 hours.   . Acute on Chronic kidney disease stage IV: family asking not to get additional blood  work to ensure comfort. Will respect wishes.  . Chronic diastolic heart failure: Suspect mostly compensated-has some mild lower extremity edema that appears to be chronic-mostly related to chronic venous stasis issues. Again, no further blood tests to ensure comfort.   . Atrial fibrillation: Holding atenolol. Stopped telemetry per family request. We have explained to family and pt that we can not Provide Coumadin safely unless with have PT/INR values. Pt verbalized understanding and still desires to hold off on blood testing even if it means no Coumadin. Pt also refusing SCD's.  Marland Kitchen. PPM-[employer]: family asked to take off cardiac monitor  . Palliative care/ethics: PCT input appreciated   Severe PCM - pt more alert this AM, advanced diet, OOB to chair if pt able to tolerate   DVT prophylaxis - SCD's if pt agrees   Code Status: DNR Family Communication:  plan of care discussed with family at bedside  Disposition Plan: possible d/c home with hospice in 24 hours   IV access:  Peripheral IV  Procedures and diagnostic studies:    Dg Chest Port 1 View  11/30/2014    Mild worsening of the bilateral airspace opacities.   Dg Chest Port 1 View  11/29/2014  Airspace opacities noted in the upper lobes, right greater than left, consistent with multifocal pneumonia. Findings could potentially reflect asymmetric edema.    Medical Consultants:  PCT  Other Consultants:  None  IAnti-Infectives:   Vancomycin 3/19 --> 3/21 Aztreonam 3/19 --> 3/21 Levaquin 3/21 -->  Debbora PrestoMAGICK-Nikie Cid, MD  [facility] Pager [phone]  If 7PM-7AM, please contact night-coverage [email or web address] Password [email or web address] 12/03/2014, 11:42  AM   LOS: 4 days   HPI/Subjective: No events overnight.   Objective: Filed Vitals:   12/02/14 0800 12/02/14 1609 12/03/14 0525 12/03/14 0929  BP: 130/78 149/80 144/68 177/67  Pulse:  72 64 67  Temp:  97.7 F (36.5 C) 97.9 F (36.6 C)   TempSrc:  Oral Oral   Resp:  24 22   Height:       Weight:      SpO2:  98% 97% 98%   No intake or output data in the 24 hours ending 12/03/14 1142  Exam:   General:  Pt is more alert this AM   Cardiovascular: Paced rhythm, no rubs, no gallops  Respiratory: bilateral rhonchi with diminished breath sounds at bases   Abdomen: Soft, non tender, non distended, bowel sounds present, no guarding  Extremities: LLE edema and erythema much improved   Data Reviewed: Basic Metabolic Panel:  Recent Labs Lab 11/29/14 1343 11/30/14 0557  NA 138 138  K 3.2* 3.8  CL 105 104  CO2 22 23  GLUCOSE 182* 173*  BUN 37* 44*  CREATININE 1.52* 1.77*  CALCIUM 8.8 8.2*   Liver Function Tests:  Recent Labs Lab 11/29/14 1343  AST 59*  ALT 16  ALKPHOS 66  BILITOT 1.4*  PROT 5.9*  ALBUMIN 2.8*    Recent Labs Lab 11/29/14 1343  LIPASE 18   CBC:  Recent Labs Lab 11/29/14 1343 11/30/14 0557  WBC 13.9* 13.9*  NEUTROABS 13.1*  --   HGB 11.1* 10.7*  HCT 34.9* 33.9*  MCV 83.7 83.5  PLT 208 190   Cardiac Enzymes:  Recent Labs Lab 11/29/14 1343 11/29/14 1915 11/30/14 0026 11/30/14 0557  TROPONINI 0.29* 0.23* 0.21* 0.53*   CBG:  Recent Labs Lab 11/29/14 2137  GLUCAP 292*    Recent Results (from the past 240 hour(s))  Clostridium Difficile by PCR     Status: None   Collection Time: 11/29/14  1:37 PM  Result Value Ref Range Status   C difficile by pcr NEGATIVE NEGATIVE Final  Blood Culture (routine x 2)     Status: None (Preliminary result)   Collection Time: 11/29/14  1:45 PM  Result Value Ref Range Status   Specimen Description BLOOD HAND LEFT  Final   Special Requests BOTTLES DRAWN AEROBIC AND ANAEROBIC 5 CC  Final   Culture   Final           BLOOD CULTURE RECEIVED NO GROWTH TO DATE CULTURE WILL BE HELD FOR 5 DAYS BEFORE ISSUING A FINAL NEGATIVE REPORT Performed at [facility]    Report Status PENDING  Incomplete  Blood Culture (routine x 2)     Status: None (Preliminary result)   Collection Time:  11/29/14  1:54 PM  Result Value Ref Range Status   Specimen Description BLOOD RIGHT ARM  Final   Special Requests BOTTLES DRAWN AEROBIC AND ANAEROBIC 5 CC  Final   Culture   Final           BLOOD CULTURE RECEIVED NO GROWTH TO DATE CULTURE WILL BE HELD FOR 5 DAYS BEFORE ISSUING A FINAL NEGATIVE REPORT Performed at [facility]    Report Status PENDING  Incomplete  MRSA PCR Screening     Status: None   Collection Time: 11/29/14  5:37 PM  Result Value Ref Range Status   MRSA by PCR NEGATIVE NEGATIVE Final     Scheduled Meds: . aspirin  325 mg Oral Daily  . carbidopa-levodopa  1 tablet  Oral TID  . digoxin  0.125 mg Oral Daily  . levofloxacin  750 mg Oral Q48H  . sodium chloride  3 mL Intravenous Q12H   Continuous Infusions:

## 2014-12-04 ENCOUNTER — Inpatient Hospital Stay (HOSPITAL_COMMUNITY): Payer: Medicare Other

## 2014-12-04 LAB — CBC
HEMATOCRIT: 34.6 % — AB (ref 36.0–46.0)
Hemoglobin: 10.9 g/dL — ABNORMAL LOW (ref 12.0–15.0)
MCH: 26.5 pg (ref 26.0–34.0)
MCHC: 31.5 g/dL (ref 30.0–36.0)
MCV: 84 fL (ref 78.0–100.0)
PLATELETS: 299 10*3/uL (ref 150–400)
RBC: 4.12 MIL/uL (ref 3.87–5.11)
RDW: 16.6 % — AB (ref 11.5–15.5)
WBC: 20.8 10*3/uL — ABNORMAL HIGH (ref 4.0–10.5)

## 2014-12-04 LAB — BASIC METABOLIC PANEL
ANION GAP: 8 (ref 5–15)
BUN: 16 mg/dL (ref 6–23)
CO2: 27 mmol/L (ref 19–32)
Calcium: 8.4 mg/dL (ref 8.4–10.5)
Chloride: 102 mmol/L (ref 96–112)
Creatinine, Ser: 0.83 mg/dL (ref 0.50–1.10)
GFR, EST AFRICAN AMERICAN: 76 mL/min — AB (ref >90)
GFR, EST NON AFRICAN AMERICAN: 66 mL/min — AB (ref >90)
Glucose, Bld: 220 mg/dL — ABNORMAL HIGH (ref 70–99)
POTASSIUM: 3.1 mmol/L — AB (ref 3.5–5.1)
SODIUM: 137 mmol/L (ref 135–145)

## 2014-12-04 LAB — CULTURE, BLOOD (ROUTINE X 2)

## 2014-12-04 LAB — PROTIME-INR
INR: 2.5 — ABNORMAL HIGH (ref 0.00–1.49)
PROTHROMBIN TIME: 27.2 s — AB (ref 11.6–15.2)

## 2014-12-04 LAB — LACTIC ACID, PLASMA: Lactic Acid, Venous: 1.5 mmol/L (ref 0.5–2.0)

## 2014-12-04 MED ORDER — WARFARIN - PHARMACIST DOSING INPATIENT
Freq: Every day | Status: DC
  Administered 2014-12-04: 18:00:00

## 2014-12-04 MED ORDER — WARFARIN SODIUM 1 MG PO TABS
1.0000 mg | ORAL_TABLET | Freq: Once | ORAL | Status: AC
  Administered 2014-12-04: 1 mg via ORAL
  Filled 2014-12-04: qty 1

## 2014-12-04 NOTE — Progress Notes (Signed)
ANTICOAGULATION CONSULT NOTE - Follow Up Consult  Pharmacy Consult for warfarin Indication: atrial fibrillation  Allergies  Allergen Reactions  . Diphenoxylate-Atropine Other (See Comments)    Unknown reaction  . Penicillins Hives and Itching    Patient Measurements: Height: 5\' 4"  (162.6 cm) Weight: 239 lb 13.8 oz (108.8 kg) IBW/kg (Calculated) : 54.7 Vital Signs: Temp: 98.9 F (37.2 C) (03/24 0909) Temp Source: Oral (03/24 0909) BP: 148/62 mmHg (03/24 0909) Pulse Rate: 80 (03/24 0959)  Labs:  Recent Labs  12/04/14 1150  LABPROT 27.2*  INR 2.50*    Estimated Creatinine Clearance: 31.6 mL/min (by C-G formula based on Cr of 1.77).  Assessment: 80 year old female on chronic warfarin for atrial fibrillation. Coumadin had been stopped for comfort care as patient did not want further lab work. However, it was been re-ordered to start today. An INR was ordered and it remains therapeutic at 2.5. Pt has not received any coumadin since PTA on 3/19. INR likely remains therapeutic due to multiple interacting antibiotics, levaquin + bactrim. Bactrim is a significant drug interaction and will make coumadin dosing a challenge. Close monitoring is necessary.   Goal of Therapy:  INR 2-3   Plan:  - Warfarin 1mg  PO x 1 tonight - Daily INR - F/u S&S of bleeding - MD - Consider adjusting antibiotics to avoid interactions with coumadin  Lysle Pearlachel Naarah Borgerding, PharmD, BCPS Pager # [phone] 12/04/2014 1:03 PM

## 2014-12-04 NOTE — Evaluation (Signed)
Physical Therapy Evaluation Patient Details Name: Leah [name] MRN: [id number] DOB: 1935-06-09 Today's Date: 12/04/2014   History of Present Illness  80 yo female with onset weakness, diagnosed with PNA and elevated lactic acid  Clinical Impression  Pt was seen for evaluation of her functional status with extremely weak presentation and limited tolerance for standing.  Her plan is to get to SNF and family in agreement due to her level of difficulty even rolling in bed.    Follow Up Recommendations SNF;Supervision/Assistance - 24 hour    Equipment Recommendations  None recommended by PT    Recommendations for Other Services       Precautions / Restrictions Precautions Precautions: Fall;ICD/Pacemaker Restrictions Weight Bearing Restrictions: No      Mobility  Bed Mobility Overal bed mobility: Needs Assistance Bed Mobility: Rolling;Supine to Sit;Sit to Supine Rolling: Min assist;Mod assist   Supine to sit: Max assist Sit to supine: Max assist   General bed mobility comments: using bedrails and has limited ability to scoot up bed with arms, using [place]  Transfers Overall transfer level: Needs assistance Equipment used: Rolling walker (2 wheeled);1 person hand held assist (elevated bed) Transfers: Sit to/from Stand Sit to Stand: Max assist;From elevated surface         General transfer comment: Struggling to push off bed with arms but can reach up to walker fairly well  Ambulation/Gait             General Gait Details: unable  Stairs            Wheelchair Mobility    Modified Rankin (Stroke Patients Only)       Balance Overall balance assessment: Needs assistance Sitting-balance support: Feet supported Sitting balance-Leahy Scale: Fair   Postural control: Posterior lean Standing balance support: Bilateral upper extremity supported Standing balance-Leahy Scale: Poor Standing balance comment: stands very briefly due to weakness in  LE's                             Pertinent Vitals/Pain Pain Assessment: Faces Pain Score: 6  Faces Pain Scale: Hurts even more Pain Location: lower legs Pain Intervention(s): Monitored during session;Limited activity within patient's tolerance;Repositioned    Home Living Family/patient expects to be discharged to:: Private residence Living Arrangements: Children Available Help at Discharge: Family Type of Home: House Home Access: Ramped entrance     Home Layout: Two level;Able to live on main level with bedroom/bathroom Home Equipment: Walker - 4 wheels;Walker - 2 wheels;Cane - single point;Shower seat      Prior Function Level of Independence: Needs assistance   Gait / Transfers Assistance Needed: mod I short trips on walker  ADL's / Homemaking Assistance Needed: family to assist        Hand Dominance        Extremity/Trunk Assessment   Upper Extremity Assessment: Generalized weakness           Lower Extremity Assessment: Generalized weakness      Cervical / Trunk Assessment: Normal  Communication   Communication: No difficulties  Cognition Arousal/Alertness: Awake/alert Behavior During Therapy: WFL for tasks assessed/performed Overall Cognitive Status: Within Functional Limits for tasks assessed                      General Comments General comments (skin integrity, edema, etc.): Pt was not able to transition to chair, fearful of falling.  Efforts to move were exhausting to her and  recommended follow up to SNF    Exercises Other Exercises Other Exercises: Strength was 4- R hip and knee and ankle were 4 to 4+;  LLE was 4 to 4+.      Assessment/Plan    PT Assessment Patient needs continued PT services  PT Diagnosis Generalized weakness   PT Problem List Decreased strength;Decreased range of motion;Decreased activity tolerance;Decreased balance;Decreased mobility;Decreased coordination;Decreased knowledge of use of  DME;Cardiopulmonary status limiting activity;Obesity;Decreased skin integrity  PT Treatment Interventions DME instruction;Gait training;Functional mobility training;Therapeutic activities;Therapeutic exercise;Balance training;Neuromuscular re-education;Patient/family education   PT Goals (Current goals can be found in the Care Plan section) Acute Rehab PT Goals Patient Stated Goal: To go home  PT Goal Formulation: With patient/family Time For Goal Achievement: 12/18/14 Potential to Achieve Goals: Good    Frequency Min 2X/week   Barriers to discharge Inaccessible home environment Usually walks up ramp to enter house    Co-evaluation               End of Session Equipment Utilized During Treatment: Gait belt;Oxygen Activity Tolerance: Patient limited by fatigue Patient left: in bed;with call bell/phone within reach;with bed alarm set;with family/visitor present Nurse Communication: Mobility status         Time: [id number] PT Time Calculation (min) (ACUTE ONLY): 26 min   Charges:   PT Evaluation $Initial PT Evaluation Tier I: 1 Procedure PT Treatments $Therapeutic Activity: 8-22 mins   PT G Codes:        Ivar DrapeStout, Awanda Wilcock E 12/04/2014, 1:22 PM   Samul Dadauth Tyron Manetta, PT MS Acute Rehab Dept. Number: [phone]

## 2014-12-04 NOTE — Progress Notes (Signed)
Medicare Important Message given?  YES (If response is "NO", the following Medicare IM given date fields will be blank) Date Medicare IM given:  12/04/14 Medicare IM given by:  Andrez Lieurance 

## 2014-12-04 NOTE — Care Management Note (Signed)
    Page 1 of 1   12/04/2014     4:53:44 PM CARE MANAGEMENT NOTE 12/04/2014  Patient:  Chanda BusingLINEBERRY,Kinzey S   Account Number:  [id number]  Date Initiated:  12/04/2014  Documentation initiated by:  Letha CapeAYLOR,Cameryn Chrisley  Subjective/Objective Assessment:   dx sepsis  admit- lives with family.     Action/Plan:   pt eval- rec snf   Anticipated DC Date:  12/05/2014   Anticipated DC Plan:  SKILLED NURSING FACILITY  In-house referral  Clinical Social Worker      DC Planning Services  CM consult      Choice offered to / List presented to:             Status of service:  Completed, signed off Medicare Important Message given?  YES (If response is "NO", the following Medicare IM given date fields will be blank) Date Medicare IM given:  12/04/2014 Medicare IM given by:  Letha CapeAYLOR,Maylon Sailors Date Additional Medicare IM given:   Additional Medicare IM given by:    Discharge Disposition:  SKILLED NURSING FACILITY  Per UR Regulation:  Reviewed for med. necessity/level of care/duration of stay  If discussed at Long Length of Stay Meetings, dates discussed:    Comments:  12/04/14 1652 Letha Capeeborah Keira Bohlin RN BSN [phone] patient [facility], CSW following.

## 2014-12-04 NOTE — Progress Notes (Addendum)
Patient ID: Leah [name], female   DOB: 1935-02-06, 80 y.o.   MRN: [id number]  [facility] PROGRESS NOTE  Leah [name] ZHY:[id number] DOB: 09/28/1934 DOA: 11/29/2014 PCP: Ezequiel Kayser, MD   Brief narrative:    80 y.o. female with chronic diastolic heart failure, atrial fibrillation on anticoagulation, Parkinson's disease, hypertension who presented with main concern of progressive weakness, dyspnea with exertion and rest, fevers, malaise. In ED, pt noted to be lethargic, hypoxic with oxygen saturation in 80's on RA, T 102.46F, CXR with right side PNA. TRH asked to admit to SDU for management of severe sepsis secondary to PNA.  Assessment/Plan:    Severe sepsis: Secondary to pneumonia and right leg cellulitis initially. Pt initially started on empiric vancomycin, aztreonam. Pt continues improving. Family initially insisting on ensuring comfort, avoiding blood work and needle sticks. Pt also wanted to avoid any blood work and additional sticks. IV ABX stopped per family and pt request, placed on oral Levaquin 3/21 and pt tolerating well so far. Advance diet as pt able to tolerate. Day 3/24, pt still doing better and pt OK with checking blood work and restarting Coumadin with pharmacy assistance. Still insisting on no IV ABX and no further imaging studies. Please note that Bactrim was added to ABX due to RLE foot ulcer and possible early osteomyelitis 3/23. XRAY did not reveal evidence of osteomyelitis and family made aware that MRI is better diagnostic study, pt refusing MRI and wants to only continue PO ABX,  PNA (pneumonia): Will continue Levaquin as noted above, appreciate PCT input. Repeat CXR today.   Elevated troponins: family asking not to do more blood testing to ensure comfort. Elevated trop likely from demand ischemia in the setting of sepsis from PNA. Family and pt made aware of blood test and desire no interventions even if troponins trending up.   Acute respiratory  failure-hypoxic: Likely secondary to above. Treating underlying pneumonia, oxygen as needed for now. Allow morphine as needed to ensure comfort. CXR repeated today for follow up and pt agrees with CXR only, no onther diagnostic tests.   Cellulitis of left leg, right foot ulcer with ? Early osteomyelitis: Bactrim added for better staph coverage. Pt does not want to have MRI done and only wants to continue wound care with ABX.   HYPERTENSION, BENIGN: Blood pressure stable over the past 24 hours.   Acute on Chronic kidney disease stage IV: family Ok to have blood work done just today to see where it stands. No routine blood work to be performed.   Chronic diastolic heart failure: Suspect mostly compensated-has some mild lower extremity edema that appears to be chronic-mostly related to chronic venous stasis issues. Again, no further blood tests to ensure comfort.   Atrial fibrillation: Holding atenolol. Stopped telemetry per family request. We have explained to family and pt that we can not Provide Coumadin safely unless with have PT/INR values. Pt now agreeable to have PT/INR checked 3/24 and to have Coumadin restarted. Will place consult to pharmacy.   PPM-[employer]: family asked to take off cardiac monitor  Parkinson's disease:  Continue home medical regimen   Palliative care/ethics: PCT input appreciated   Severe PCM - pt more alert this AM, advanced diet, OOB to chair if pt able to tolerate   DVT prophylaxis - place on Coumadin, pt agrees   Code Status: DNR Family Communication:  plan of care discussed with family at bedside  Disposition Plan: possible d/c SNF in 24-48 hours   IV access:  Peripheral IV  Procedures and diagnostic studies:    Dg Chest Port 1 View  11/30/2014    Mild worsening of the bilateral airspace opacities.   Dg Chest Port 1 View  11/29/2014  Airspace opacities noted in the upper lobes, right greater than left, consistent with multifocal pneumonia. Findings could  potentially reflect asymmetric edema.    Medical Consultants:  PCT  Other Consultants:  None  IAnti-Infectives:   Vancomycin 3/19 --> 3/21 Aztreonam 3/19 --> 3/21 Levaquin 3/21 --> Bactrim 3/23 -->  Leah PrestoMAGICK-Tej Murdaugh, MD  [facility] Pager [phone]  If 7PM-7AM, please contact night-coverage [email or web address] Password [email or web address] 12/04/2014, 5:53 PM   LOS: 5 days   HPI/Subjective: No events overnight.   Objective: Filed Vitals:   12/03/14 1905 12/04/14 0507 12/04/14 0909 12/04/14 0959  BP: 174/86 155/74 148/62   Pulse: 104 75  80  Temp: 99 F (37.2 C) 98.4 F (36.9 C) 98.9 F (37.2 C)   TempSrc: Oral Oral Oral   Resp: 27 24    Height:      Weight:  108.8 kg (239 lb 13.8 oz)    SpO2: 96% 98%      Intake/Output Summary (Last 24 hours) at 12/04/14 1753 Last data filed at 12/04/14 0754  Gross per 24 hour  Intake    240 ml  Output      0 ml  Net    240 ml    Exam:   General:  Pt is more alert this AM   Cardiovascular: Paced rhythm, no rubs, no gallops  Respiratory: bilateral rhonchi but improved, diminished breath sounds at bases   Abdomen: Soft, non tender, non distended, bowel sounds present, no guarding  Extremities: LLE edema and erythema much improved, plantar aspect of the right foot with ulcer, healing better, less drainage   Data Reviewed: Basic Metabolic Panel:  Recent Labs Lab 11/29/14 1343 11/30/14 0557  NA 138 138  K 3.2* 3.8  CL 105 104  CO2 22 23  GLUCOSE 182* 173*  BUN 37* 44*  CREATININE 1.52* 1.77*  CALCIUM 8.8 8.2*   Liver Function Tests:  Recent Labs Lab 11/29/14 1343  AST 59*  ALT 16  ALKPHOS 66  BILITOT 1.4*  PROT 5.9*  ALBUMIN 2.8*    Recent Labs Lab 11/29/14 1343  LIPASE 18   CBC:  Recent Labs Lab 11/29/14 1343 11/30/14 0557  WBC 13.9* 13.9*  NEUTROABS 13.1*  --   HGB 11.1* 10.7*  HCT 34.9* 33.9*  MCV 83.7 83.5  PLT 208 190   Cardiac Enzymes:  Recent Labs Lab 11/29/14 1343 11/29/14 1915 11/30/14 0026  11/30/14 0557  TROPONINI 0.29* 0.23* 0.21* 0.53*   CBG:  Recent Labs Lab 11/29/14 2137  GLUCAP 292*    Recent Results (from the past 240 hour(s))  Clostridium Difficile by PCR     Status: None   Collection Time: 11/29/14  1:37 PM  Result Value Ref Range Status   C difficile by pcr NEGATIVE NEGATIVE Final  Blood Culture (routine x 2)     Status: None (Preliminary result)   Collection Time: 11/29/14  1:45 PM  Result Value Ref Range Status   Specimen Description BLOOD HAND LEFT  Final   Special Requests BOTTLES DRAWN AEROBIC AND ANAEROBIC 5 CC  Final   Culture   Final           BLOOD CULTURE RECEIVED NO GROWTH TO DATE CULTURE WILL BE HELD FOR 5 DAYS BEFORE ISSUING A FINAL NEGATIVE REPORT Performed at  [facility]    Report Status PENDING  Incomplete  Blood Culture (routine x 2)     Status: None (Preliminary result)   Collection Time: 11/29/14  1:54 PM  Result Value Ref Range Status   Specimen Description BLOOD RIGHT ARM  Final   Special Requests BOTTLES DRAWN AEROBIC AND ANAEROBIC 5 CC  Final   Culture   Final           BLOOD CULTURE RECEIVED NO GROWTH TO DATE CULTURE WILL BE HELD FOR 5 DAYS BEFORE ISSUING A FINAL NEGATIVE REPORT Performed at [facility]    Report Status PENDING  Incomplete  MRSA PCR Screening     Status: None   Collection Time: 11/29/14  5:37 PM  Result Value Ref Range Status   MRSA by PCR NEGATIVE NEGATIVE Final     Scheduled Meds: . aspirin  325 mg Oral Daily  . carbidopa-levodopa  1 tablet Oral TID  . digoxin  0.125 mg Oral Daily  . hydrocerin   Topical Daily  . levofloxacin  750 mg Oral Q48H  . mupirocin cream   Topical Daily  . sodium chloride  3 mL Intravenous Q12H  . sulfamethoxazole-trimethoprim  1 tablet Oral Q12H  . warfarin  1 mg Oral ONCE-1800  . Warfarin - Pharmacist Dosing Inpatient   Does not apply q1800   Continuous Infusions:

## 2014-12-05 LAB — CULTURE, BLOOD (ROUTINE X 2): Culture: NO GROWTH

## 2014-12-05 LAB — PROTIME-INR
INR: 2.55 — AB (ref 0.00–1.49)
Prothrombin Time: 27.6 seconds — ABNORMAL HIGH (ref 11.6–15.2)

## 2014-12-05 MED ORDER — WARFARIN SODIUM 1 MG PO TABS
1.0000 mg | ORAL_TABLET | Freq: Once | ORAL | Status: DC
  Filled 2014-12-05: qty 1

## 2014-12-05 MED ORDER — HYDROCERIN EX CREA: 1.0000 "application " | TOPICAL_CREAM | Freq: Two times a day (BID) | CUTANEOUS | Status: DC

## 2014-12-05 MED ORDER — POTASSIUM CHLORIDE CRYS ER 20 MEQ PO TBCR
40.0000 meq | EXTENDED_RELEASE_TABLET | Freq: Once | ORAL | Status: AC
  Administered 2014-12-05: 40 meq via ORAL
  Filled 2014-12-05: qty 2

## 2014-12-05 MED ORDER — MORPHINE SULFATE (CONCENTRATE) 10 MG/0.5ML PO SOLN: 5.0000 mg | ORAL | Status: DC | PRN

## 2014-12-05 MED ORDER — FUROSEMIDE 20 MG PO TABS: 20.0000 mg | ORAL_TABLET | Freq: Every day | ORAL | Status: DC

## 2014-12-05 MED ORDER — LEVOFLOXACIN 750 MG PO TABS
750.0000 mg | ORAL_TABLET | ORAL | Status: DC
  Filled 2014-12-05: qty 1

## 2014-12-05 MED ORDER — GUAIFENESIN-DM 100-10 MG/5ML PO SYRP: 5.0000 mL | ORAL_SOLUTION | ORAL | Status: DC | PRN

## 2014-12-05 MED ORDER — ALBUTEROL SULFATE (2.5 MG/3ML) 0.083% IN NEBU: 2.5000 mg | INHALATION_SOLUTION | RESPIRATORY_TRACT | Status: DC | PRN

## 2014-12-05 MED ORDER — SULFAMETHOXAZOLE-TRIMETHOPRIM 800-160 MG PO TABS: 1.0000 | ORAL_TABLET | Freq: Two times a day (BID) | ORAL | Status: DC

## 2014-12-05 MED ORDER — LORAZEPAM 1 MG PO TABS: 1.0000 mg | ORAL_TABLET | ORAL | Status: DC | PRN

## 2014-12-05 MED ORDER — ASPIRIN 325 MG PO TABS: 325.0000 mg | ORAL_TABLET | Freq: Every day | ORAL | Status: DC

## 2014-12-05 MED ORDER — LEVOFLOXACIN 750 MG PO TABS: 750.0000 mg | ORAL_TABLET | ORAL | Status: DC

## 2014-12-05 NOTE — Clinical Social Work Psychosocial (Signed)
Clinical Social Work Department BRIEF PSYCHOSOCIAL ASSESSMENT 12/05/2014  Patient:  Leah [name], Leah [name]     Account Number:  [id number]     Admit date:  11/29/2014  Clinical Social Worker:  Lovey Newcomer  Date/Time:  12/04/2014 10:12 AM  Referred by:  Physician  Date Referred:  12/05/2014 Referred for  SNF Placement   Other Referral:   NA   Interview type:  Patient Other interview type:   Patient and daughter interviewed at bedside.    PSYCHOSOCIAL DATA Living Status:  ALONE Admitted from facility:   Level of care:   Primary support name:  Leah [name] Primary support relationship to patient:  CHILD, ADULT Degree of support available:   Support is good.    CURRENT CONCERNS Current Concerns  Post-Acute Placement   Other Concerns:   NA    SOCIAL WORK ASSESSMENT / PLAN CSW met with patient and daughter at bedside to complete assessement. Patient states, "I'm just ready to get out of here." The patient has shown a dramatic turn around as the patient was heading towards comfort care but has bounced back. Patient and family are requesting SNF placement at discharge and specifically request [facility]. CSW explained SNF search/placement process and answered patient's questions.    Patient expresses her feeling regarding placement by saying, "I just feel like a burden and liability to my family." Daughter at bedside comforted the patient and explained that her family does not view her in this way and definitely want the patient to return home once able. Patient appears motivated to work with PT as she states, "I want to find out if I can walk or not, and see how bad it is." CSW will follow up with bed offers.   Assessment/plan status:  Psychosocial Support/Ongoing Assessment of Needs Other assessment/ plan:   Complete Fl2, Fax, PASRR   Information/referral to community resources:   CSW contact information and SNF list given.    PATIENT'S/FAMILY'S RESPONSE TO PLAN  OF CARE: Patient and patient's daughter are planning for the patient to DC to [facility] once medically stable. Patient and family appear optimistic about patient being able to return home after short term SNF stay. CSW will assist.       Liz Beach MSW, [place], [place], [phone]

## 2014-12-05 NOTE — Progress Notes (Signed)
Leah [name] to be D/C'd Skilled nursing facility per MD order.  Discussed with the patient and all questions fully answered.  VSS. Right foot dressing, dry and intact. Interdry intact between skin folds on abdomen.   An After Visit Summary was printed and given to the patient. Patient received prescription.  D/c education completed with patient/family including follow up instructions, medication list, d/c activities limitations if indicated, with other d/c instructions as indicated by MD - patient able to verbalize understanding, all questions fully answered.   [facility]    L'ESPERANCE, Timotheus Salm C 12/05/2014 3:08 PM

## 2014-12-05 NOTE — Discharge Instructions (Signed)

## 2014-12-05 NOTE — Progress Notes (Signed)
Inpatient Diabetes Program Recommendations  AACE/ADA: New Consensus Statement on Inpatient Glycemic Control (2013)  Target Ranges:  Prepandial:   less than 140 mg/dL      Peak postprandial:   less than 180 mg/dL (1-2 hours)      Critically ill patients:  140 - 180 mg/dL   Results for Leah [name], Leah [name] (MRN [id number]) as of 12/05/2014 08:34  Ref. Range 11/29/2014 13:43 11/30/2014 05:57 12/04/2014 20:00  Glucose Latest Range: 70-99 mg/dL 295182 (H) 284173 (H) 132220 (H)    Reason for assessment: elevated lab glucose last night 220 mg/dl  Diabetes history: Noted in history Outpatient Diabetes medications: Amaryl 4mg  qday, Lantus 20 units bid, Glucophage 1000mg  bid Current orders for Inpatient glycemic control: none  Lab glucose last night- 220mg /dl.  No current orders for blood sugar control- palliative care consult noted.  Please assess need for CBG testing and glycemic agents.   Susette RacerJulie Walaa Carel, RN, BA, MHA, CDE Diabetes Coordinator Inpatient Diabetes Program  [phone] (Team Pager) [phone] Patrcia Dolly([place] Office) 12/05/2014 8:44 AM

## 2014-12-05 NOTE — Discharge Summary (Signed)
Physician Discharge Summary  Leah BusingMennie S [name] ZOX:[id number] DOB: 07/29/1935 DOA: 11/29/2014  PCP: Ezequiel KayserPERINI,MARK A, MD  Admit date: 11/29/2014 Discharge date: 12/05/2014  Recommendations for Outpatient Follow-up:  1. Pt will need to follow up with PCP in 2-3 weeks post discharge 2. Blood work could be checked upon follow up if pt agrees, please note she has refused blood work inpatient  3. Pt to continue taking Bactrim upon discharge for 10 more days for right foot ulcer 4. Pt also to take Levaquin for 5 more days for PNA 5. Low dose lasix started for vascular congestion, pt did not want higher dose to avoid frequent voiding  6. Pt continue oxygen via [place] at rest and with exertion until respiratory status reassessed by PCP 7. Pt also to receive wound care with ulcer at the right foot area 8. Pt will have palliative care tea, to follow at SNF per family request   Discharge Diagnoses:  Active Problems:   HYPERTENSION, BENIGN   Atrial fibrillation   Chronic diastolic heart failure   Chronic kidney disease   PPM-[employer]tr[employer]   Severe sepsis   PNA (pneumonia)   Cellulitis of right leg   DM (diabetes mellitus), type 2   CAP (community acquired pneumonia)   Palliative care encounter   Dyspnea   Weakness generalized   DNR (do not resuscitate)   Discharge Condition: Stable  Diet recommendation: Heart healthy diet discussed in details    Brief narrative:    80 y.o. female with chronic diastolic heart failure, atrial fibrillation on anticoagulation, Parkinson's disease, hypertension who presented with main concern of progressive weakness, dyspnea with exertion and rest, fevers, malaise. In ED, pt noted to be lethargic, hypoxic with oxygen saturation in 80's on RA, T 102.82F, CXR with right side PNA. TRH asked to admit to SDU for management of severe sepsis secondary to PNA.  Assessment/Plan:    Severe sepsis: Secondary to pneumonia and right leg cellulitis initially. Pt initially  started on empiric vancomycin, aztreonam. Pt continued improving. Family initially insisting on ensuring comfort, avoiding blood work and needle sticks. Pt also wanted to avoid any blood work and additional sticks. IV ABX stopped per family and pt request, placed on oral Levaquin 3/21 and pt tolerating well so far. Advanced diet and pt tolerating well so far. Day 3/24, pt still doing better and pt OK with checking blood work and restarting Coumadin with pharmacy assistance. Still insisting on no IV ABX and no further imaging studies. Please note that Bactrim was added to ABX due to RLE foot ulcer and possible early osteomyelitis 3/23. XRAY did not reveal evidence of osteomyelitis and family made aware that MRI is better diagnostic study, pt refusing MRI and wants to only continue PO ABX. Pt will need to receive wound care upon discharge.   PNA (pneumonia): Will continue Levaquin as noted above, appreciate PCT input.   Elevated troponins: family asking not to do more blood testing to ensure comfort. Elevated trop likely from demand ischemia in the setting of sepsis from PNA. Family and pt made aware of blood test and desire no interventions even if troponins trending up.   Acute respiratory failure-hypoxic: Likely secondary to above. Treating underlying pneumonia, oxygen as needed for now. Allow morphine as needed to ensure comfort. CXR repeated and consistent with PNA and developing vascular congestion. Pt OK with low dose Lasix only to avoid frequent voiding. Family made aware of need to repeat CXR in next 2 weeks to ensure resolution or sooner if  respiratory status gets worse.   Cellulitis of left leg, right foot ulcer with ? Early osteomyelitis: Bactrim added for better staph coverage. Pt does not want to have MRI done and only wants to continue wound care with ABX. Pt will need continuation of wound care upon discharge. Right foot ulcer much improved with less drainage and pt says it does not hurt as  much. LLL cellulitis also much improved.   HYPERTENSION, BENIGN: Blood pressure stable over the past 24 hours.   Acute on Chronic kidney disease stage IV: repeat blood work noted resolution of acute renal failure and Cr is now WNL.  Acute on Chronic diastolic heart failure: Mild crackles on exam this Am and CXR 3/24 with pulmonary vascular congestion. Pt agreeable to starting low dose Lasix upon discharge but no further CXR. She wants to be discharged today and family also asking for her to be discharged. Discussed with family if dyspnea gets worse, she needs repeat CXR to ensure no worsening pulmonary vascular congestion.   Atrial fibrillation: Stopped telemetry per family request. We have explained to family and pt that we can not Provide Coumadin safely unless with have PT/INR values. Pt now agreeable to have PT/INR checked 3/24 and to have Coumadin restarted. INR has remained at target range and pt agreeable to continue upon discharge. Also continue atenolol per home medical regimen.  PPM-Medtronic: family asked to take off cardiac monitor, pt denies chest pain this AM  Parkinson's disease: Continue home medical regimen   Palliative care/ethics: PCT input appreciated, family wants PCT to follow at SNF  Severe PCM - in the setting of acute on chronic illness, tolerating diet well    Code Status: DNR Family Communication: plan of care discussed with family at bedside  Disposition Plan: SNF today   IV access:  Peripheral IV  Procedures and diagnostic studies:   Dg Chest Port 1 View 11/30/2014 Mild worsening of the bilateral airspace opacities.   Dg Chest Port 1 View 11/29/2014 Airspace opacities noted in the upper lobes, right greater than left, consistent with multifocal pneumonia. Findings could potentially reflect asymmetric edema.   Dg Chest Port 1 View  12/04/2014  Pulmonary edema. There is more focal consolidation in bilateral medial upper lobes, superimposed  pneumonia is not excluded.     Dg Foot Complete Right  12/03/2014   No plain film evidence of osteomyelitis.     Medical Consultants:  PCT  Other Consultants:  None  IAnti-Infectives:   Vancomycin 3/19 --> 3/21 Aztreonam 3/19 --> 3/21 Levaquin 3/21 --> for 5 more days post discharge  Bactrim 3/23 --> for 10 more days post discharge        Discharge Exam: Filed Vitals:   12/05/14 0608  BP: 142/57  Pulse:   Temp:   Resp:    Filed Vitals:   12/04/14 0959 12/05/14 0444 12/05/14 0522 12/05/14 0608  BP:   163/64 142/57  Pulse: 80  65   Temp:   98.4 F (36.9 C)   TempSrc:   Oral   Resp:   20   Height:      Weight:  108 kg (238 lb 1.6 oz)    SpO2:   97%      General: Pt is more alert this AM   Cardiovascular: Paced rhythm, no rubs, no gallops  Respiratory: bilateral rhonchi and crackles mostly at bases   Abdomen: Soft, non tender, non distended, bowel sounds present, no guarding  Extremities: LLE edema and erythema much improved, plantar  aspect of the right foot with ulcer, healing better, less drainage  Discharge Instructions  Discharge Instructions    Diet - low sodium heart healthy    Complete by:  As directed      Increase activity slowly    Complete by:  As directed             Medication List    STOP taking these medications        cephALEXin 250 MG capsule  Commonly known as:  KEFLEX     hydrochlorothiazide 25 MG tablet  Commonly known as:  HYDRODIURIL      TAKE these medications        albuterol (2.5 MG/3ML) 0.083% nebulizer solution  Commonly known as:  PROVENTIL  Take 3 mLs (2.5 mg total) by nebulization every 4 (four) hours as needed for wheezing.     alendronate 70 MG tablet  Commonly known as:  FOSAMAX  Take 70 mg by mouth every 7 (seven) days. Take with a full glass of water on an empty stomach.     aspirin 325 MG tablet  Take 1 tablet (325 mg total) by mouth daily.     atenolol 25 MG tablet  Commonly known as:   TENORMIN  Take 75 mg by mouth daily. Two in the am and one in the pm     atorvastatin 20 MG tablet  Commonly known as:  LIPITOR  Take 20 mg by mouth daily at 6 PM.     carbidopa-levodopa 25-100 MG per tablet  Commonly known as:  SINEMET IR  Take 1 tablet by mouth 3 (three) times daily.     CULTURELLE DIGESTIVE HEALTH Caps  Take 1 capsule by mouth daily.     digoxin 0.125 MG tablet  Commonly known as:  LANOXIN  Take 0.125 mg by mouth daily.     fish oil-omega-3 fatty acids 1000 MG capsule  Take 2 g by mouth at bedtime.     furosemide 20 MG tablet  Commonly known as:  LASIX  Take 1 tablet (20 mg total) by mouth daily.     glimepiride 4 MG tablet  Commonly known as:  AMARYL  Take 4 mg by mouth daily before breakfast.     guaiFENesin-dextromethorphan 100-10 MG/5ML syrup  Commonly known as:  ROBITUSSIN DM  Take 5 mLs by mouth every 4 (four) hours as needed for cough.     hydrocerin Crea  Apply 1 application topically 2 (two) times daily.     insulin glargine 100 UNIT/ML injection  Commonly known as:  LANTUS  Inject 20 Units into the skin 2 (two) times daily.     levofloxacin 750 MG tablet  Commonly known as:  LEVAQUIN  Take 1 tablet (750 mg total) by mouth daily.     lisinopril 40 MG tablet  Commonly known as:  PRINIVIL,ZESTRIL  Take 40 mg by mouth at bedtime.     LORazepam 1 MG tablet  Commonly known as:  ATIVAN  Take 1 tablet (1 mg total) by mouth every 4 (four) hours as needed for anxiety or sleep.     metFORMIN 1000 MG tablet  Commonly known as:  GLUCOPHAGE  Take 1,000 mg by mouth 2 (two) times daily with a meal.     morphine CONCENTRATE 10 MG/0.5ML Soln concentrated solution  Take 0.25 mLs (5 mg total) by mouth every 2 (two) hours as needed for moderate pain, severe pain or shortness of breath.     sulfamethoxazole-trimethoprim 800-160 MG  per tablet  Commonly known as:  BACTRIM DS,SEPTRA DS  Take 1 tablet by mouth every 12 (twelve) hours.     VITAMIN B12  PO  Take 2,000 mg by mouth at bedtime.     VITAMIN D (CHOLECALCIFEROL) PO  Take 2,000 Units by mouth at bedtime.     warfarin 5 MG tablet  Commonly known as:  COUMADIN  Take 5 mg by mouth daily at 6 PM. 7.5 mg on Tues and Thurs. 5 mg on Mon, Wed, Fri, Sat, and Sun.            Follow-up Information    Follow up with Ezequiel Kayser, MD.   Specialty:  Internal Medicine   Contact information:   [address] [place] [address] [phone]       Follow up with Debbora Presto, MD.   Specialty:  Internal Medicine   Why:  As needed call my cell phone [phone]   Contact information:   [address] [place] [address] [phone]        The results of significant diagnostics from this hospitalization (including imaging, microbiology, ancillary and laboratory) are listed below for reference.     Microbiology: Recent Results (from the past 240 hour(s))  Clostridium Difficile by PCR     Status: None   Collection Time: 11/29/14  1:37 PM  Result Value Ref Range Status   C difficile by pcr NEGATIVE NEGATIVE Final  Blood Culture (routine x 2)     Status: None   Collection Time: 11/29/14  1:45 PM  Result Value Ref Range Status   Specimen Description BLOOD HAND LEFT  Final   Special Requests BOTTLES DRAWN AEROBIC AND ANAEROBIC 5 CC  Final   Culture   Final    DIPHTHEROIDS(CORYNEBACTERIUM SPECIES) Note: Standardized susceptibility testing for this organism is not available. Note: Gram Stain Report Called to,Read Back By and Verified With: [facility] @ 11:50 AM 12/02/14 BY DWEEKS Performed at [facility]    Report Status 12/04/2014 FINAL  Final  Blood Culture (routine x 2)     Status: None   Collection Time: 11/29/14  1:54 PM  Result Value Ref Range Status   Specimen Description BLOOD RIGHT ARM  Final   Special Requests BOTTLES DRAWN AEROBIC AND ANAEROBIC 5 CC  Final   Culture   Final    NO GROWTH 5 DAYS Performed at [facility]    Report Status 12/05/2014 FINAL  Final  Urine culture     Status: None   Collection Time: 11/29/14  2:04 PM  Result Value Ref Range Status   Specimen Description URINE, CATHETERIZED  Final   Special Requests NONE  Final   Colony Count NO GROWTH Performed at [facility]   Final   Culture NO GROWTH Performed at [facility]   Final   Report Status 11/30/2014 FINAL  Final  MRSA PCR Screening     Status: None   Collection Time: 11/29/14  5:37 PM  Result Value Ref Range Status   MRSA by PCR NEGATIVE NEGATIVE Final     Labs: Basic Metabolic Panel:  Recent Labs Lab 11/29/14 1343 11/30/14 0557 12/04/14 2000  NA 138 138 137  K 3.2* 3.8 3.1*  CL 105 104 102  CO2 GLUCOSE 182* 173* 220*  BUN 37* 44* 16  CREATININE 1.52* 1.77* 0.83  CALCIUM 8.8 8.2* 8.4   Liver Function Tests:  Recent Labs Lab 11/29/14  1343  AST 59*  ALT 16  ALKPHOS 66  BILITOT 1.4*  PROT 5.9*  ALBUMIN 2.8*    Recent Labs Lab 11/29/14 1343  LIPASE 18   CBC:  Recent Labs Lab 11/29/14 1343 11/30/14 0557 12/04/14 2000  WBC 13.9* 13.9* 20.8*  NEUTROABS 13.1*  --   --   HGB 11.1* 10.7* 10.9*  HCT 34.9* 33.9* 34.6*  MCV 83.7 83.5 84.0  PLT 208 190 299   Cardiac Enzymes:  Recent Labs Lab 11/29/14 1343 11/29/14 1915 11/30/14 0026 11/30/14 0557  TROPONINI 0.29* 0.23* 0.21* 0.53*   BNP: BNP (last 3 results)  Recent Labs  11/29/14 1343  BNP 521.2*     CBG:  Recent Labs Lab 11/29/14 2137  GLUCAP 292*    SIGNED: Time coordinating discharge: Over 30 minutes  Debbora Presto, MD  [facility] 12/05/2014, 9:53 AM Pager [phone] Cell [phone]  If 7PM-7AM, please contact night-coverage [email or web address] Password [email or web address]

## 2014-12-05 NOTE — Progress Notes (Signed)
Report called to Zenda AlpersMary Porter,RN at [facility] for [facility].

## 2014-12-05 NOTE — Clinical Social Work Placement (Signed)
Clinical Social Work Department CLINICAL SOCIAL WORK PLACEMENT NOTE 12/05/2014  Patient:  Leah [name],Leah [name]  Account Number:  [id number] Admit date:  11/29/2014  Clinical Social Worker:  Lavell LusterJOSEPH BRYANT Antoine Vandermeulen, LCSWA  Date/time:  12/05/2014 10:26 AM  Clinical Social Work is seeking post-discharge placement for this patient at the following level of care:   SKILLED NURSING   (*CSW will update this form in Epic as items are completed)   12/05/2014  Patient/family provided with [facility] Department of Clinical Social Work'[name] list of facilities offering this level of care within the geographic area requested by the patient (or if unable, by the patient'[name] family).  12/05/2014  Patient/family informed of their freedom to choose among providers that offer the needed level of care, that participate in Medicare, Medicaid or managed care program needed by the patient, have an available bed and are willing to accept the patient.  12/05/2014  Patient/family informed of [facility]' ownership interest in [facility], as well as of the fact that they are under no obligation to receive care at this facility.  PASARR submitted to EDS on 12/05/2014 PASARR number received on 12/05/2014  FL2 transmitted to all facilities in geographic area requested by pt/family on  12/05/2014 FL2 transmitted to all facilities within larger geographic area on   Patient informed that his/her managed care company has contracts with or will negotiate with  certain facilities, including the following:     Patient/family informed of bed offers received:  12/05/2014 [facility] Physician recommends and patient chooses bed at    [facility] on  12/05/2014 Patient to be transferred to facility by ambulance Patient and family notified of transfer on 12/05/2014 Name of family member notified:  Leah [name]  The following  physician request were entered in Epic:   Additional Comments:  Per MD [facility]. RN, patient, patient'[name] family, and facility notified of DC. RN given number for report. DC packet on chart. AMbulance transport requested for patient. CSW signing off.    Roddie McBryant Jerzey Komperda MSW, [place]CSWA, [place], [phone]

## 2014-12-05 NOTE — Progress Notes (Signed)
ANTICOAGULATION CONSULT NOTE - Follow Up Consult  Pharmacy Consult for coumadin Indication: atrial fibrillation  Allergies  Allergen Reactions  . Diphenoxylate-Atropine Other (See Comments)    Unknown reaction  . Penicillins Hives and Itching    Patient Measurements: Height: 5\' 4"  (162.6 cm) Weight: 238 lb 1.6 oz (108 kg) IBW/kg (Calculated) : 54.7 Heparin Dosing Weight:   Vital Signs: Temp: 98.4 F (36.9 C) (03/25 0522) Temp Source: Oral (03/25 0522) BP: 142/57 mmHg (03/25 0608) Pulse Rate: 65 (03/25 0522)  Labs:  Recent Labs  12/04/14 1150 12/04/14 2000  HGB  --  10.9*  HCT  --  34.6*  PLT  --  299  LABPROT 27.2*  --   INR 2.50*  --   CREATININE  --  0.83    Estimated Creatinine Clearance: 67 mL/min (by C-G formula based on Cr of 0.83).   Medications:  Scheduled:  . aspirin  325 mg Oral Daily  . carbidopa-levodopa  1 tablet Oral TID  . digoxin  0.125 mg Oral Daily  . hydrocerin   Topical Daily  . levofloxacin  750 mg Oral Q24H  . mupirocin cream   Topical Daily  . sulfamethoxazole-trimethoprim  1 tablet Oral Q12H  . Warfarin - Pharmacist Dosing Inpatient   Does not apply q1800   Infusions:    Assessment: 80 yo female with afib is currently on therapeutic coumadin. INR today is 2.55.  Patient is on both bactrim and levaquin.  Goal of Therapy:  INR 2-3 Monitor platelets by anticoagulation protocol: Yes   Plan:  - coumadin 1 mg po x1 - INR in am  Allye Hoyos, Tsz-Yin 12/05/2014,8:51 AM

## 2014-12-08 ENCOUNTER — Non-Acute Institutional Stay (SKILLED_NURSING_FACILITY): Payer: Medicare Other | Admitting: Internal Medicine

## 2014-12-08 DIAGNOSIS — J9601 Acute respiratory failure with hypoxia: Secondary | ICD-10-CM

## 2014-12-08 DIAGNOSIS — I10 Essential (primary) hypertension: Secondary | ICD-10-CM

## 2014-12-08 DIAGNOSIS — A419 Sepsis, unspecified organism: Secondary | ICD-10-CM

## 2014-12-08 DIAGNOSIS — G20 Parkinson's disease: Secondary | ICD-10-CM

## 2014-12-08 DIAGNOSIS — J189 Pneumonia, unspecified organism: Secondary | ICD-10-CM | POA: Diagnosis not present

## 2014-12-08 DIAGNOSIS — Z950 Presence of cardiac pacemaker: Secondary | ICD-10-CM

## 2014-12-08 DIAGNOSIS — I480 Paroxysmal atrial fibrillation: Secondary | ICD-10-CM

## 2014-12-08 DIAGNOSIS — L03115 Cellulitis of right lower limb: Secondary | ICD-10-CM

## 2014-12-08 DIAGNOSIS — R6520 Severe sepsis without septic shock: Secondary | ICD-10-CM | POA: Diagnosis not present

## 2014-12-08 DIAGNOSIS — I5033 Acute on chronic diastolic (congestive) heart failure: Secondary | ICD-10-CM | POA: Diagnosis not present

## 2014-12-08 NOTE — Progress Notes (Signed)
MRN: [id number] Name: Leah [name]  Sex: female Age: 80 y.o. DOB: 03/08/1935  [facility] Facility[facility] Level Of Care: SNF Provider: Merrilee SeashoreALEXANDER, Minahil Quinlivan D Emergency Contacts: Extended Emergency Contact Information Primary Emergency Contact: Pinette,Russell Home Phone: [phone] Relation: Other  Code Status: DNR  Allergies: Diphenoxylate-atropine and Penicillins  Chief Complaint  Patient presents with  . New Admit To SNF    HPI: Patient is 80 y.o. female who is admitted to SNF for generalized weakness after hospitalization for acute respiratory failure 2/2 PNA and acute on chronic CHF.  Past Medical History  Diagnosis Date  . Type 2 diabetes mellitus 1972  . Hyperlipidemia   . HTN (hypertension)   . Morbid obesity   . AF (atrial fibrillation)   . Osteoporosis   . Vitamin B12 deficiency   . Low back pain   . Cellulitis of foot, right   . Diabetic retinopathy   . Ankle fracture, right 1972    Past Surgical History  Procedure Laterality Date  . Cholecystectomy  1985  . Pacemaker insertion      Medtronic      Medication List       This list is accurate as of: 12/08/14 11:59 PM.  Always use your most recent med list.               albuterol (2.5 MG/3ML) 0.083% nebulizer solution  Commonly known as:  PROVENTIL  Take 3 mLs (2.5 mg total) by nebulization every 4 (four) hours as needed for wheezing.     alendronate 70 MG tablet  Commonly known as:  FOSAMAX  Take 70 mg by mouth every 7 (seven) days. Take with a full glass of water on an empty stomach.     aspirin 325 MG tablet  Take 1 tablet (325 mg total) by mouth daily.     atenolol 25 MG tablet  Commonly known as:  TENORMIN  Take 75 mg by mouth daily. Two in the am and one in the pm     atorvastatin 20 MG tablet  Commonly known as:  LIPITOR  Take 20 mg by mouth daily at 6 PM.     carbidopa-levodopa 25-100 MG per tablet  Commonly known as:  SINEMET IR  Take 1 tablet by mouth 3  (three) times daily.     CULTURELLE DIGESTIVE HEALTH Caps  Take 1 capsule by mouth daily.     digoxin 0.125 MG tablet  Commonly known as:  LANOXIN  Take 0.125 mg by mouth daily.     fish oil-omega-3 fatty acids 1000 MG capsule  Take 2 g by mouth at bedtime.     furosemide 20 MG tablet  Commonly known as:  LASIX  Take 1 tablet (20 mg total) by mouth daily.     glimepiride 4 MG tablet  Commonly known as:  AMARYL  Take 4 mg by mouth daily before breakfast.     guaiFENesin-dextromethorphan 100-10 MG/5ML syrup  Commonly known as:  ROBITUSSIN DM  Take 5 mLs by mouth every 4 (four) hours as needed for cough.     hydrocerin Crea  Apply 1 application topically 2 (two) times daily.     insulin glargine 100 UNIT/ML injection  Commonly known as:  LANTUS  Inject 20 Units into the skin 2 (two) times daily.     levofloxacin 750 MG tablet  Commonly known as:  LEVAQUIN  Take 1 tablet (750 mg total) by mouth daily.     lisinopril 40 MG tablet  Commonly known as:  PRINIVIL,ZESTRIL  Take 40 mg by mouth at bedtime.     LORazepam 1 MG tablet  Commonly known as:  ATIVAN  Take 1 tablet (1 mg total) by mouth every 4 (four) hours as needed for anxiety or sleep.     metFORMIN 1000 MG tablet  Commonly known as:  GLUCOPHAGE  Take 1,000 mg by mouth 2 (two) times daily with a meal.     morphine CONCENTRATE 10 MG/0.5ML Soln concentrated solution  Take 0.25 mLs (5 mg total) by mouth every 2 (two) hours as needed for moderate pain, severe pain or shortness of breath.     sulfamethoxazole-trimethoprim 800-160 MG per tablet  Commonly known as:  BACTRIM DS,SEPTRA DS  Take 1 tablet by mouth every 12 (twelve) hours.     VITAMIN B12 PO  Take 2,000 mg by mouth at bedtime.     VITAMIN D (CHOLECALCIFEROL) PO  Take 2,000 Units by mouth at bedtime.     warfarin 5 MG tablet  Commonly known as:  COUMADIN  Take 5 mg by mouth daily at 6 PM. 7.5 mg on Tues and Thurs. 5 mg on Mon, Wed, Fri, Sat, and Sun.         No orders of the defined types were placed in this encounter.     There is no immunization history on file for this patient.  History  Substance Use Topics  . Smoking status: Never Smoker   . Smokeless tobacco: Not on file  . Alcohol Use: No    Family history is noncontributory    Review of Systems  DATA OBTAINED: from patient, family member GENERAL:  no fevers, fatigue, appetite changes SKIN: No c/o pain EYES: No eye pain, redness, discharge EARS: No earache, tinnitus, change in hearing NOSE: No congestion, drainage or bleeding  MOUTH/THROAT: No mouth or tooth pain, No sore throat RESPIRATORY: No cough, wheezing, SOB CARDIAC: No chest pain, palpitations, lower extremity edema  GI: No abdominal pain, No N/V/D or constipation, No heartburn or reflux  GU: No dysuria, frequency or urgency, or incontinence  MUSCULOSKELETAL: No unrelieved bone/joint pain NEUROLOGIC: No headache, dizziness or focal weakness PSYCHIATRIC: No overt anxiety or sadness, No behavior issue.   Filed Vitals:   12/10/14 2020  BP: 175/88  Pulse: 58  Temp: 97.2 F (36.2 C)  Resp: 23    Physical Exam  GENERAL APPEARANCE: Alert, conversant,  No acute distress.  SKIN: dressing R foot/leg HEAD: Normocephalic, atraumatic  EYES: Conjunctiva/lids clear. Pupils round, reactive. EOMs intact.  EARS: External exam WNL, canals clear. Hearing grossly normal.  NOSE: No deformity or discharge.  MOUTH/THROAT: Lips w/o lesions  RESPIRATORY: Breathing is even, unlabored. Lung sounds are clear   CARDIOVASCULAR: Heart RRR no murmurs, rubs or gallops. No peripheral edema.   GASTROINTESTINAL: Abdomen is soft, non-tender, not distended w/ normal bowel sounds. GENITOURINARY: Bladder non tender, not distended  MUSCULOSKELETAL: No abnormal joints or musculature NEUROLOGIC:  Cranial nerves 2-12 grossly intact. Moves all extremities  PSYCHIATRIC: Mood and affect appropriate to situation, no behavioral  issues  Patient Active Problem List   Diagnosis Date Noted  . Acute respiratory failure with hypoxia 12/10/2014  . Acute on chronic diastolic heart failure 12/10/2014  . Parkinson disease 12/10/2014  . Palliative care encounter 12/01/2014  . Dyspnea 12/01/2014  . Weakness generalized 12/01/2014  . DNR (do not resuscitate) 12/01/2014  . CAP (community acquired pneumonia)   . Severe sepsis 11/29/2014  . PNA (pneumonia) 11/29/2014  . Cellulitis  of right leg 11/29/2014  . DM (diabetes mellitus), type 2 11/29/2014  . ATRIAL FLUTTER 09/01/2010  . Chronic diastolic heart failure 09/01/2010  . PPM-Medtronic 09/01/2010  . DM 08/31/2010  . HYPERLIPIDEMIA-MIXED 08/31/2010  . HYPERTENSION, BENIGN 08/31/2010  . Atrial fibrillation 08/31/2010  . Chronic kidney disease 08/31/2010    CBC    Component Value Date/Time   WBC 20.8* 12/04/2014 2000   RBC 4.12 12/04/2014 2000   HGB 10.9* 12/04/2014 2000   HCT 34.6* 12/04/2014 2000   PLT 299 12/04/2014 2000   MCV 84.0 12/04/2014 2000   LYMPHSABS 0.3* 11/29/2014 1343   MONOABS 0.5 11/29/2014 1343   EOSABS 0.0 11/29/2014 1343   BASOSABS 0.0 11/29/2014 1343    CMP     Component Value Date/Time   NA 137 12/04/2014 2000   K 3.1* 12/04/2014 2000   CL 102 12/04/2014 2000   CO2 27 12/04/2014 2000   GLUCOSE 220* 12/04/2014 2000   BUN 16 12/04/2014 2000   CREATININE 0.83 12/04/2014 2000   CALCIUM 8.4 12/04/2014 2000   PROT 5.9* 11/29/2014 1343   ALBUMIN 2.8* 11/29/2014 1343   AST 59* 11/29/2014 1343   ALT 16 11/29/2014 1343   ALKPHOS 66 11/29/2014 1343   BILITOT 1.4* 11/29/2014 1343   GFRNONAA 66* 12/04/2014 2000   GFRAA 76* 12/04/2014 2000    Assessment and Plan  Severe sepsis Secondary to pneumonia and right leg cellulitis initially. Pt initially started on empiric vancomycin, aztreonam. Pt continued improving. Family initially insisting on ensuring comfort, avoiding blood work and needle sticks. Pt also wanted to avoid any blood  work and additional sticks. IV ABX stopped per family and pt request, placed on oral Levaquin 3/21 and pt tolerating well so far. Advanced diet and pt tolerating well so far. Day 3/24, pt still doing better and pt OK with checking blood work and restarting Coumadin with pharmacy assistance. Still insisting on no IV ABX and no further imaging studies. Please note that Bactrim was added to ABX due to RLE foot ulcer and possible early osteomyelitis 3/23. XRAY did not reveal evidence of osteomyelitis and family made aware that MRI is better diagnostic study, pt refusing MRI and wants to only continue PO ABX. Pt will need to receive wound care upon discharge.    CAP (community acquired pneumonia) 1. Pt also to take Levaquin for 5 more days for PNA   Acute respiratory failure with hypoxia  Treating underlying pneumonia, oxygen as needed for now. Allow morphine as needed to ensure comfort. CXR repeated and consistent with PNA and developing vascular congestion. Pt OK with low dose Lasix only to avoid frequent voiding. Family made aware of need to repeat CXR in next 2 weeks to ensure resolution or sooner if respiratory status gets worse.     Cellulitis of right leg right foot ulcer with ? Early osteomyelitis: Bactrim added for better staph coverage. Pt does not want to have MRI done and only wants to continue wound care with ABX. Pt will need continuation of wound care upon discharge. Right foot ulcer much improved with less drainage and pt says it does not hurt as much. LLL cellulitis also much improved.    Acute on chronic diastolic heart failure Mild crackles on exam this Am and CXR 3/24 with pulmonary vascular congestion. Pt agreeable to starting low dose Lasix upon discharge but no further CXR. She wants to be discharged today and family also asking for her to be discharged. Discussed with family if dyspnea  gets worse, she needs repeat CXR to ensure no worsening pulmonary vascular congestion.     HYPERTENSION, BENIGN BP was low in hospital but is now elevated.Start hydralazine 25 TID   Atrial fibrillation Stopped telemetry per family request. We have explained to family and pt that we can not Provide Coumadin safely unless with have PT/INR values. Pt now agreeable to have PT/INR checked 3/24 and to have Coumadin restarted. INR has remained at target range and pt agreeable to continue upon discharge. Also continue atenolol per home medical regimen.   Parkinson disease Continue sinemet-IR   PPM-[employer]tr[employer] Working     Margit Hanks, MD

## 2014-12-09 ENCOUNTER — Non-Acute Institutional Stay (SKILLED_NURSING_FACILITY): Payer: Medicare Other | Admitting: Nurse Practitioner

## 2014-12-09 DIAGNOSIS — N189 Chronic kidney disease, unspecified: Secondary | ICD-10-CM

## 2014-12-09 DIAGNOSIS — Z7901 Long term (current) use of anticoagulants: Secondary | ICD-10-CM | POA: Diagnosis not present

## 2014-12-09 DIAGNOSIS — R197 Diarrhea, unspecified: Secondary | ICD-10-CM | POA: Diagnosis not present

## 2014-12-09 DIAGNOSIS — I4891 Unspecified atrial fibrillation: Secondary | ICD-10-CM | POA: Diagnosis not present

## 2014-12-09 DIAGNOSIS — E1122 Type 2 diabetes mellitus with diabetic chronic kidney disease: Secondary | ICD-10-CM | POA: Diagnosis not present

## 2014-12-09 NOTE — Progress Notes (Signed)
Patient ID: Leah [name], female   DOB: 05/08/1935, 80 y.o.   MRN: [id number]    [facility]ursing [facility]   Place of Service: SNF (31)  PCP: Ezequiel KayserPERINI,MARK A, MD  Allergies  Allergen Reactions  . Diphenoxylate-Atropine Other (See Comments)    Unknown reaction  . Penicillins Hives and Itching    Chief Complaint  Patient presents with  . Medical Management of Chronic Issues    HPI:  Patient is a 80 y.o. female [facility] at [facility] due to low blood sugars.pt with a PMH of chronic diastolic heart failure, atrial fibrillation on anticoagulation, Parkinson's disease, hypertension who [facility] after being treated for sepsis due to right side PNA. Pts blood sugars have been low due to decreased appetite. 2 days ago blood sugars were 97, 76, also had critical low after lantus 20 units was given last PM. All insulin was held this morning.  Pt also on levaquin and bactrim for PNA and heel wound.  INR yesterday was 4.0 and coumadin currently on HOLD.  Review of Systems:  Review of Systems  Constitutional: Positive for appetite change (decrease appitite). Negative for activity change, fatigue and unexpected weight change.  HENT: Negative for congestion and hearing loss.   Eyes: Negative.   Respiratory: Negative for cough and shortness of breath.   Cardiovascular: Negative for chest pain, palpitations and leg swelling.  Gastrointestinal: Negative for abdominal pain, diarrhea and constipation.  Genitourinary: Negative for dysuria and difficulty urinating.  Musculoskeletal: Negative for myalgias and arthralgias.  Skin: Negative for color change and wound.  Neurological: Positive for weakness. Negative for dizziness.  Psychiatric/Behavioral: Negative for behavioral problems, confusion and agitation.    Past Medical History  Diagnosis Date  . Type 2 diabetes mellitus 1972  . Hyperlipidemia   . HTN  (hypertension)   . Morbid obesity   . AF (atrial fibrillation)   . Osteoporosis   . Vitamin B12 deficiency   . Low back pain   . Cellulitis of foot, right   . Diabetic retinopathy   . Ankle fracture, right 1972   Past Surgical History  Procedure Laterality Date  . Cholecystectomy  1985  . Pacemaker insertion      Medtronic   Social History:   reports that she has never smoked. She does not have any smokeless tobacco history on file. She reports that she does not drink alcohol or use illicit drugs.  No family history on file.  Medications: Patient's Medications  New Prescriptions   No medications on file  Previous Medications   ALBUTEROL (PROVENTIL) (2.5 MG/3ML) 0.083% NEBULIZER SOLUTION    Take 3 mLs (2.5 mg total) by nebulization every 4 (four) hours as needed for wheezing.   ALENDRONATE (FOSAMAX) 70 MG TABLET    Take 70 mg by mouth every 7 (seven) days. Take with a full glass of water on an empty stomach.   ASPIRIN 325 MG TABLET    Take 1 tablet (325 mg total) by mouth daily.   ATENOLOL (TENORMIN) 25 MG TABLET    Take 75 mg by mouth daily. Two in the am and one in the pm   ATORVASTATIN (LIPITOR) 20 MG TABLET    Take 20 mg by mouth daily at 6 PM.    CARBIDOPA-LEVODOPA (SINEMET IR) 25-100 MG PER TABLET    Take 1 tablet by mouth 3 (three) times daily.   CYANOCOBALAMIN (VITAMIN B12 PO)  Take 2,000 mg by mouth at bedtime.    DIGOXIN (LANOXIN) 0.125 MG TABLET    Take 0.125 mg by mouth daily.   FISH OIL-OMEGA-3 FATTY ACIDS 1000 MG CAPSULE    Take 2 g by mouth at bedtime.    FUROSEMIDE (LASIX) 20 MG TABLET    Take 1 tablet (20 mg total) by mouth daily.   GLIMEPIRIDE (AMARYL) 4 MG TABLET    Take 4 mg by mouth daily before breakfast.   GUAIFENESIN-DEXTROMETHORPHAN (ROBITUSSIN DM) 100-10 MG/5ML SYRUP    Take 5 mLs by mouth every 4 (four) hours as needed for cough.   HYDROCERIN (EUCERIN) CREA    Apply 1 application topically 2 (two) times daily.   INSULIN GLARGINE (LANTUS) 100 UNIT/ML  INJECTION    Inject 20 Units into the skin 2 (two) times daily.    LACTOBACILLUS-INULIN (CULTURELLE DIGESTIVE HEALTH) CAPS    Take 1 capsule by mouth daily.   LEVOFLOXACIN (LEVAQUIN) 750 MG TABLET    Take 1 tablet (750 mg total) by mouth daily.   LISINOPRIL (PRINIVIL,ZESTRIL) 40 MG TABLET    Take 40 mg by mouth at bedtime.    LORAZEPAM (ATIVAN) 1 MG TABLET    Take 1 tablet (1 mg total) by mouth every 4 (four) hours as needed for anxiety or sleep.   METFORMIN (GLUCOPHAGE) 1000 MG TABLET    Take 1,000 mg by mouth 2 (two) times daily with a meal.   MORPHINE SULFATE (MORPHINE CONCENTRATE) 10 MG/0.5ML SOLN CONCENTRATED SOLUTION    Take 0.25 mLs (5 mg total) by mouth every 2 (two) hours as needed for moderate pain, severe pain or shortness of breath.   SULFAMETHOXAZOLE-TRIMETHOPRIM (BACTRIM DS,SEPTRA DS) 800-160 MG PER TABLET    Take 1 tablet by mouth every 12 (twelve) hours.   VITAMIN D, CHOLECALCIFEROL, PO    Take 2,000 Units by mouth at bedtime.    WARFARIN (COUMADIN) 5 MG TABLET    Take 5 mg by mouth daily at 6 PM. 7.5 mg on Tues and Thurs. 5 mg on Mon, Wed, Fri, Sat, and Sun.  Modified Medications   No medications on file  Discontinued Medications   No medications on file     Physical Exam: Filed Vitals:   12/09/14 1602  BP: 121/63  Pulse: 60  Temp: 97.6 F (36.4 C)  Resp: 20    Physical Exam  Constitutional: She is oriented to person, place, and time. She appears well-developed and well-nourished. No distress.  HENT:  Head: Normocephalic and atraumatic.  Mouth/Throat: Oropharynx is clear and moist. No oropharyngeal exudate.  Eyes: Conjunctivae are normal. Pupils are equal, round, and reactive to light.  Neck: Normal range of motion. Neck supple.  Cardiovascular: Normal rate, regular rhythm and normal heart sounds.   Pulmonary/Chest: Effort normal and breath sounds normal.  Abdominal: Soft. Bowel sounds are normal. She exhibits no distension. There is no tenderness.  Neurological:  She is alert and oriented to person, place, and time.  Skin: Skin is warm and dry. She is not diaphoretic.  Psychiatric: She has a normal mood and affect.    Labs reviewed: Basic Metabolic Panel:  Recent Labs  16/10/96 1343 11/30/14 0557 12/04/14 2000  NA 138 138 137  K 3.2* 3.8 3.1*  CL 105 104 102  CO2 GLUCOSE 182* 173* 220*  BUN 37* 44* 16  CREATININE 1.52* 1.77* 0.83  CALCIUM 8.8 8.2* 8.4   Liver Function Tests:  Recent Labs  11/29/14 1343  AST  59*  ALT 16  ALKPHOS 66  BILITOT 1.4*  PROT 5.9*  ALBUMIN 2.8*    Recent Labs  11/29/14 1343  LIPASE 18   No results for input(s): AMMONIA in the last 8760 hours. CBC:  Recent Labs  11/29/14 1343 11/30/14 0557 12/04/14 2000  WBC 13.9* 13.9* 20.8*  NEUTROABS 13.1*  --   --   HGB 11.1* 10.7* 10.9*  HCT 34.9* 33.9* 34.6*  MCV 83.7 83.5 84.0  PLT 208 190 299   TSH: No results for input(s): TSH in the last 8760 hours. A1C: Lab Results  Component Value Date   HGBA1C * 07/31/2009    6.7 (NOTE) The ADA recommends the following therapeutic goal for glycemic control related to Hgb A1c measurement: Goal of therapy: <6.5 Hgb A1c  Reference: American Diabetes Association: Clinical Practice Recommendations 2010, Diabetes Care, 2010, 33: (Suppl  1).   Lipid Panel: No results for input(s): CHOL, HDL, LDLCALC, TRIG, CHOLHDL, LDLDIRECT in the last 8760 hours.   Assessment/Plan 1. Type 2 diabetes mellitus with diabetic chronic kidney disease -will decrease lantus to 10 units BID, cont to monitor CBGs BID and will adjust insulin as needed  2. Atrial fibrillation, unspecified Rate controlled on current medications  3. Long term current use of anticoagulant therapy INR Today was 4.4, coumadin has been DC until further orders written -follow up INR in 2 days  4. Diarrhea Episode of diarrhea 2 days ago. Now with increased gas but no diarrhea. Denies Nausea or vomiting.

## 2014-12-10 ENCOUNTER — Encounter: Payer: Self-pay | Admitting: Internal Medicine

## 2014-12-10 DIAGNOSIS — J9601 Acute respiratory failure with hypoxia: Secondary | ICD-10-CM | POA: Insufficient documentation

## 2014-12-10 DIAGNOSIS — I5033 Acute on chronic diastolic (congestive) heart failure: Secondary | ICD-10-CM | POA: Insufficient documentation

## 2014-12-10 DIAGNOSIS — G20A1 Parkinson's disease without dyskinesia, without mention of fluctuations: Secondary | ICD-10-CM | POA: Insufficient documentation

## 2014-12-10 DIAGNOSIS — G20 Parkinson's disease: Secondary | ICD-10-CM | POA: Insufficient documentation

## 2014-12-10 NOTE — Assessment & Plan Note (Signed)
Stopped telemetry per family request. We have explained to family and pt that we can not Provide Coumadin safely unless with have PT/INR values. Pt now agreeable to have PT/INR checked 3/24 and to have Coumadin restarted. INR has remained at target range and pt agreeable to continue upon discharge. Also continue atenolol per home medical regimen.

## 2014-12-10 NOTE — Assessment & Plan Note (Signed)
Secondary to pneumonia and right leg cellulitis initially. Pt initially started on empiric vancomycin, aztreonam. Pt continued improving. Family initially insisting on ensuring comfort, avoiding blood work and needle sticks. Pt also wanted to avoid any blood work and additional sticks. IV ABX stopped per family and pt request, placed on oral Levaquin 3/21 and pt tolerating well so far. Advanced diet and pt tolerating well so far. Day 3/24, pt still doing better and pt OK with checking blood work and restarting Coumadin with pharmacy assistance. Still insisting on no IV ABX and no further imaging studies. Please note that Bactrim was added to ABX due to RLE foot ulcer and possible early osteomyelitis 3/23. XRAY did not reveal evidence of osteomyelitis and family made aware that MRI is better diagnostic study, pt refusing MRI and wants to only continue PO ABX. Pt will need to receive wound care upon discharge.

## 2014-12-10 NOTE — Assessment & Plan Note (Signed)
Working

## 2014-12-10 NOTE — Assessment & Plan Note (Signed)
1. Pt also to take Levaquin for 5 more days for PNA

## 2014-12-10 NOTE — Assessment & Plan Note (Signed)
right foot ulcer with ? Early osteomyelitis: Bactrim added for better staph coverage. Pt does not want to have MRI done and only wants to continue wound care with ABX. Pt will need continuation of wound care upon discharge. Right foot ulcer much improved with less drainage and pt says it does not hurt as much. LLL cellulitis also much improved.

## 2014-12-10 NOTE — Assessment & Plan Note (Signed)
BP was low in hospital but is now elevated.Start hydralazine 25 TID

## 2014-12-10 NOTE — Assessment & Plan Note (Signed)
Treating underlying pneumonia, oxygen as needed for now. Allow morphine as needed to ensure comfort. CXR repeated and consistent with PNA and developing vascular congestion. Pt OK with low dose Lasix only to avoid frequent voiding. Family made aware of need to repeat CXR in next 2 weeks to ensure resolution or sooner if respiratory status gets worse.

## 2014-12-10 NOTE — Assessment & Plan Note (Signed)
Continue sinemet-IR

## 2014-12-10 NOTE — Assessment & Plan Note (Signed)
Mild crackles on exam this Am and CXR 3/24 with pulmonary vascular congestion. Pt agreeable to starting low dose Lasix upon discharge but no further CXR. She wants to be discharged today and family also asking for her to be discharged. Discussed with family if dyspnea gets worse, she needs repeat CXR to ensure no worsening pulmonary vascular congestion.

## 2014-12-16 ENCOUNTER — Encounter: Payer: Self-pay | Admitting: Internal Medicine

## 2014-12-21 ENCOUNTER — Emergency Department (HOSPITAL_COMMUNITY)
Admission: EM | Admit: 2014-12-21 | Discharge: 2014-12-21 | Disposition: A | Discharge status: Home / Self Care | Payer: Medicare Other | Attending: Emergency Medicine | Admitting: Emergency Medicine

## 2014-12-21 ENCOUNTER — Encounter (HOSPITAL_COMMUNITY): Payer: Self-pay

## 2014-12-21 DIAGNOSIS — Z872 Personal history of diseases of the skin and subcutaneous tissue: Secondary | ICD-10-CM | POA: Insufficient documentation

## 2014-12-21 DIAGNOSIS — Z8781 Personal history of (healed) traumatic fracture: Secondary | ICD-10-CM | POA: Diagnosis not present

## 2014-12-21 DIAGNOSIS — E872 Acidosis, unspecified: Secondary | ICD-10-CM

## 2014-12-21 DIAGNOSIS — K625 Hemorrhage of anus and rectum: Secondary | ICD-10-CM | POA: Diagnosis present

## 2014-12-21 DIAGNOSIS — Z794 Long term (current) use of insulin: Secondary | ICD-10-CM | POA: Diagnosis not present

## 2014-12-21 DIAGNOSIS — E119 Type 2 diabetes mellitus without complications: Secondary | ICD-10-CM | POA: Diagnosis not present

## 2014-12-21 DIAGNOSIS — E785 Hyperlipidemia, unspecified: Secondary | ICD-10-CM | POA: Diagnosis not present

## 2014-12-21 DIAGNOSIS — Z8739 Personal history of other diseases of the musculoskeletal system and connective tissue: Secondary | ICD-10-CM | POA: Diagnosis not present

## 2014-12-21 DIAGNOSIS — Z7982 Long term (current) use of aspirin: Secondary | ICD-10-CM | POA: Insufficient documentation

## 2014-12-21 DIAGNOSIS — I10 Essential (primary) hypertension: Secondary | ICD-10-CM | POA: Diagnosis not present

## 2014-12-21 DIAGNOSIS — Z792 Long term (current) use of antibiotics: Secondary | ICD-10-CM | POA: Insufficient documentation

## 2014-12-21 DIAGNOSIS — Z880 Allergy status to penicillin: Secondary | ICD-10-CM | POA: Diagnosis not present

## 2014-12-21 DIAGNOSIS — Z79899 Other long term (current) drug therapy: Secondary | ICD-10-CM | POA: Diagnosis not present

## 2014-12-21 DIAGNOSIS — Z7901 Long term (current) use of anticoagulants: Secondary | ICD-10-CM | POA: Diagnosis not present

## 2014-12-21 DIAGNOSIS — R58 Hemorrhage, not elsewhere classified: Secondary | ICD-10-CM

## 2014-12-21 LAB — CBC WITH DIFFERENTIAL/PLATELET
BASOS ABS: 0.1 10*3/uL (ref 0.0–0.1)
Basophils Relative: 1 % (ref 0–1)
EOS ABS: 0.3 10*3/uL (ref 0.0–0.7)
EOS PCT: 3 % (ref 0–5)
HCT: 34.5 % — ABNORMAL LOW (ref 36.0–46.0)
Hemoglobin: 10.5 g/dL — ABNORMAL LOW (ref 12.0–15.0)
LYMPHS ABS: 2 10*3/uL (ref 0.7–4.0)
Lymphocytes Relative: 19 % (ref 12–46)
MCH: 25.6 pg — ABNORMAL LOW (ref 26.0–34.0)
MCHC: 30.4 g/dL (ref 30.0–36.0)
MCV: 84.1 fL (ref 78.0–100.0)
Monocytes Absolute: 1 10*3/uL (ref 0.1–1.0)
Monocytes Relative: 10 % (ref 3–12)
Neutro Abs: 6.8 10*3/uL (ref 1.7–7.7)
Neutrophils Relative %: 67 % (ref 43–77)
Platelets: 352 10*3/uL (ref 150–400)
RBC: 4.1 MIL/uL (ref 3.87–5.11)
RDW: 16.4 % — AB (ref 11.5–15.5)
WBC: 10.2 10*3/uL (ref 4.0–10.5)

## 2014-12-21 LAB — COMPREHENSIVE METABOLIC PANEL
ALT: 8 U/L (ref 0–35)
AST: 33 U/L (ref 0–37)
Albumin: 2.8 g/dL — ABNORMAL LOW (ref 3.5–5.2)
Alkaline Phosphatase: 85 U/L (ref 39–117)
Anion gap: 10 (ref 5–15)
BILIRUBIN TOTAL: 0.9 mg/dL (ref 0.3–1.2)
BUN: 11 mg/dL (ref 6–23)
CALCIUM: 9.5 mg/dL (ref 8.4–10.5)
CHLORIDE: 104 mmol/L (ref 96–112)
CO2: 25 mmol/L (ref 19–32)
Creatinine, Ser: 0.96 mg/dL (ref 0.50–1.10)
GFR calc Af Amer: 64 mL/min — ABNORMAL LOW (ref >90)
GFR calc non Af Amer: 55 mL/min — ABNORMAL LOW (ref >90)
Glucose, Bld: 118 mg/dL — ABNORMAL HIGH (ref 70–99)
POTASSIUM: 4.6 mmol/L (ref 3.5–5.1)
SODIUM: 139 mmol/L (ref 135–145)
Total Protein: 6.7 g/dL (ref 6.0–8.3)

## 2014-12-21 LAB — DIGOXIN LEVEL: DIGOXIN LVL: 0.5 ng/mL — AB (ref 0.8–2.0)

## 2014-12-21 LAB — I-STAT CG4 LACTIC ACID, ED
LACTIC ACID, VENOUS: 2.87 mmol/L — AB (ref 0.5–2.0)
Lactic Acid, Venous: 3.32 mmol/L (ref 0.5–2.0)

## 2014-12-21 LAB — PROTIME-INR
INR: 1.25 (ref 0.00–1.49)
PROTHROMBIN TIME: 15.8 s — AB (ref 11.6–15.2)

## 2014-12-21 LAB — POC OCCULT BLOOD, ED: Fecal Occult Bld: NEGATIVE

## 2014-12-21 MED ORDER — DIGOXIN 125 MCG PO TABS
0.1250 mg | ORAL_TABLET | Freq: Once | ORAL | Status: AC
  Administered 2014-12-21: 0.125 mg via ORAL
  Filled 2014-12-21: qty 1

## 2014-12-21 MED ORDER — SODIUM CHLORIDE 0.9 % IV BOLUS (SEPSIS)
500.0000 mL | INTRAVENOUS | Status: AC
  Administered 2014-12-21: 500 mL via INTRAVENOUS

## 2014-12-21 MED ORDER — SODIUM CHLORIDE 0.9 % IV SOLN
INTRAVENOUS | Status: DC
  Administered 2014-12-21: 12:00:00 via INTRAVENOUS

## 2014-12-21 NOTE — ED Provider Notes (Signed)
CSN: [id number]     Arrival date & time 12/21/14  1148 History   First MD Initiated Contact with Patient 12/21/14 1148     Chief Complaint  Patient presents with  . Rectal Bleeding     (Consider location/radiation/quality/duration/timing/severity/associated sxs/prior Treatment) HPI  Patient presents from her nursing facility with concerns of rectal bleeding. Per report the patient was being used, when frank red blood was noticed near her rectum. The patient herself is unsure if she has been bleeding, states that she feels as though she has a skin tear. She denies lightheadedness or syncope, acknowledges weakness. No recent medication changes. Patient takes both digoxin and Coumadin. Patient has been in her facility for about one week following hospitalization for sepsis, pneumonia. I evaluated the patient in the emergency department, admitted the patient for that illness. Today the patient is much more interactive, in less distress than on occasion. History of present illness's by the patient, EMS providers, family, nursing home notes.   Past Medical History  Diagnosis Date  . Type 2 diabetes mellitus 1972  . Hyperlipidemia   . HTN (hypertension)   . Morbid obesity   . AF (atrial fibrillation)   . Osteoporosis   . Vitamin B12 deficiency   . Low back pain   . Cellulitis of foot, right   . Diabetic retinopathy   . Ankle fracture, right 1972   Past Surgical History  Procedure Laterality Date  . Cholecystectomy  1985  . Pacemaker insertion      Medtronic   History reviewed. No pertinent family history. History  Substance Use Topics  . Smoking status: Never Smoker   . Smokeless tobacco: Not on file  . Alcohol Use: No   OB History    No data available     Review of Systems  Constitutional:       Per HPI, otherwise negative  HENT:       Per HPI, otherwise negative  Respiratory:       Per HPI, otherwise negative  Cardiovascular:       Per HPI, otherwise negative   Gastrointestinal: Negative for vomiting.  Endocrine:       Negative aside from HPI  Genitourinary:       Neg aside from HPI   Musculoskeletal:       Per HPI, otherwise negative  Skin: Negative.   Neurological: Negative for syncope.      Allergies  Diphenoxylate-atropine and Penicillins  Home Medications   Prior to Admission medications   Medication Sig Start Date End Date Taking? Authorizing Provider  albuterol (PROVENTIL) (2.5 MG/3ML) 0.083% nebulizer solution Take 3 mLs (2.5 mg total) by nebulization every 4 (four) hours as needed for wheezing. 12/05/14   Dorothea OgleIskra M Myers, MD  alendronate (FOSAMAX) 70 MG tablet Take 70 mg by mouth every 7 (seven) days. Take with a full glass of water on an empty stomach.    Historical Provider, MD  aspirin 325 MG tablet Take 1 tablet (325 mg total) by mouth daily. 12/05/14   Dorothea OgleIskra M Myers, MD  atenolol (TENORMIN) 25 MG tablet Take 75 mg by mouth daily. Two in the am and one in the pm    Historical Provider, MD  atorvastatin (LIPITOR) 20 MG tablet Take 20 mg by mouth daily at 6 PM.     Historical Provider, MD  carbidopa-levodopa (SINEMET IR) 25-100 MG per tablet Take 1 tablet by mouth 3 (three) times daily.    Historical Provider, MD  Cyanocobalamin (VITAMIN B12  PO) Take 2,000 mg by mouth at bedtime.     Historical Provider, MD  digoxin (LANOXIN) 0.125 MG tablet Take 0.125 mg by mouth daily.    Historical Provider, MD  fish oil-omega-3 fatty acids 1000 MG capsule Take 2 g by mouth at bedtime.     Historical Provider, MD  furosemide (LASIX) 20 MG tablet Take 1 tablet (20 mg total) by mouth daily. 12/05/14   Dorothea Ogle, MD  glimepiride (AMARYL) 4 MG tablet Take 4 mg by mouth daily before breakfast.    Historical Provider, MD  guaiFENesin-dextromethorphan (ROBITUSSIN DM) 100-10 MG/5ML syrup Take 5 mLs by mouth every 4 (four) hours as needed for cough. 12/05/14   Dorothea Ogle, MD  hydrocerin (EUCERIN) CREA Apply 1 application topically 2 (two) times  daily. 12/05/14   Dorothea Ogle, MD  insulin glargine (LANTUS) 100 UNIT/ML injection Inject 20 Units into the skin 2 (two) times daily.     Historical Provider, MD  Lactobacillus-Inulin (CULTURELLE DIGESTIVE HEALTH) CAPS Take 1 capsule by mouth daily.    Historical Provider, MD  levofloxacin (LEVAQUIN) 750 MG tablet Take 1 tablet (750 mg total) by mouth daily. 12/05/14   Dorothea Ogle, MD  lisinopril (PRINIVIL,ZESTRIL) 40 MG tablet Take 40 mg by mouth at bedtime.     Historical Provider, MD  LORazepam (ATIVAN) 1 MG tablet Take 1 tablet (1 mg total) by mouth every 4 (four) hours as needed for anxiety or sleep. 12/05/14   Dorothea Ogle, MD  metFORMIN (GLUCOPHAGE) 1000 MG tablet Take 1,000 mg by mouth 2 (two) times daily with a meal.    Historical Provider, MD  Morphine Sulfate (MORPHINE CONCENTRATE) 10 MG/0.5ML SOLN concentrated solution Take 0.25 mLs (5 mg total) by mouth every 2 (two) hours as needed for moderate pain, severe pain or shortness of breath. 12/05/14   Dorothea Ogle, MD  sulfamethoxazole-trimethoprim (BACTRIM DS,SEPTRA DS) 800-160 MG per tablet Take 1 tablet by mouth every 12 (twelve) hours. 12/05/14   Dorothea Ogle, MD  VITAMIN D, CHOLECALCIFEROL, PO Take 2,000 Units by mouth at bedtime.     Historical Provider, MD  warfarin (COUMADIN) 5 MG tablet Take 5 mg by mouth daily at 6 PM. 7.5 mg on Tues and Thurs. 5 mg on Mon, Wed, Fri, Sat, and Sun.    Historical Provider, MD   BP 117/93 mmHg  Pulse 68  Temp(Src) 97.9 F (36.6 C) (Oral)  Resp 16  SpO2 98% Physical Exam  Constitutional: She is oriented to person, place, and time. She appears listless.  Morbidly obese supine, answering questions appropriately  HENT:  Head: Normocephalic and atraumatic.  Eyes: Conjunctivae and EOM are normal.  Cardiovascular: Normal rate and regular rhythm.   Pulmonary/Chest: No stridor. Tachypnea noted. She has decreased breath sounds.  Abdominal: She exhibits no distension. There is no tenderness.    Genitourinary: Rectum normal. Guaiac negative stool.  Skin breakdown on gluteal cleft  Musculoskeletal: She exhibits no edema.  Neurological: She is oriented to person, place, and time. She appears listless. No cranial nerve deficit.  Skin: Skin is warm and dry.     Psychiatric: She has a normal mood and affect.  Nursing note and vitals reviewed.   ED Course  Procedures (including critical care time) Labs Review Labs Reviewed  COMPREHENSIVE METABOLIC PANEL - Abnormal; Notable for the following:    Glucose, Bld 118 (*)    Albumin 2.8 (*)    GFR calc non Af Amer 55 (*)  GFR calc Af Amer 64 (*)    All other components within normal limits  CBC WITH DIFFERENTIAL/PLATELET - Abnormal; Notable for the following:    Hemoglobin 10.5 (*)    HCT 34.5 (*)    MCH 25.6 (*)    RDW 16.4 (*)    All other components within normal limits  PROTIME-INR - Abnormal; Notable for the following:    Prothrombin Time 15.8 (*)    All other components within normal limits  DIGOXIN LEVEL - Abnormal; Notable for the following:    Digoxin Level 0.5 (*)    All other components within normal limits  I-STAT CG4 LACTIC ACID, ED - Abnormal; Notable for the following:    Lactic Acid, Venous 3.32 (*)    All other components within normal limits  POC OCCULT BLOOD, ED     Initial labs notable for lactic acidosis. Patient has received IV fluids empirically.  After the initial evaluation I reviewed the patient's chart, including my admission notes from a month ago for sepsis and pneumonia, with troponin elevation.   2:15 PM Patient in no distress. Wound care provided, with sacral decubitus patch  4:10 PM Patient requesting D/C. LA repeat decreased substantially. I discussed all findings with patient and her family members. We reviewed the need for continued close monitoring.   MDM  Results from her nursing facility with concerns of possible rectal bleeding. Here the patient has a minimal bleeding  acute this lesion, no evidence for rectal bleeding. Patient's involvement is essentially unchanged from recent admission. Patient has no evidence for sepsis, with no leukocytosis or fever. Patient is a mild lactic acidosis, likely from dehydration, and received fluid resuscitation, with appropriate decrease in her lactic acid level. Patient is a resident of a rehabilitation facility, was appropriate for discharged to this facility for continued therapy following her admission for sepsis.     Gerhard Munch, MD 12/21/14 [phone]

## 2014-12-21 NOTE — ED Notes (Signed)
Jeraldine LootsLockwood, MD notified of abnormal lab test results

## 2014-12-21 NOTE — ED Notes (Signed)
To room via EMS. [facility] and staff reported rectal bleeding.  INR 1.24.  EMS BP 150/90 SpO2 97% RR 16.  Pt has orthopnea, exertional dyspnea.  Pt reports breathing is no worse. Lungs clear.  CBG 171.  [facility].l

## 2014-12-21 NOTE — ED Notes (Signed)
PTAR at bedside. Report given.  

## 2014-12-21 NOTE — ED Notes (Signed)
Pt put on bedpan, skin tear bleeding and clotting noted at sacral area.  Pt has yeast on lower abd and in between bilateral groin.  MD at bedside.

## 2014-12-21 NOTE — ED Notes (Signed)
Dr Jeraldine LootsLockwood given a copy of lactic acid results 2.87

## 2014-12-21 NOTE — Discharge Instructions (Signed)
As discussed, your evaluation today has been largely reassuring.  But, it is important that you monitor your condition carefully, and do not hesitate to return to the ED if you develop new, or concerning changes in your condition. ? ?Otherwise, please follow-up with your physician for appropriate ongoing care. ? ?

## 2014-12-21 NOTE — ED Notes (Signed)
[facility] French Anaracy.

## 2014-12-21 NOTE — ED Notes (Signed)
Duoderm applied  to skin tear above anus.

## 2014-12-25 ENCOUNTER — Non-Acute Institutional Stay (SKILLED_NURSING_FACILITY): Payer: Medicare Other | Admitting: Internal Medicine

## 2014-12-25 ENCOUNTER — Encounter: Payer: Self-pay | Admitting: Internal Medicine

## 2014-12-25 DIAGNOSIS — G20 Parkinson's disease: Secondary | ICD-10-CM | POA: Diagnosis not present

## 2014-12-25 DIAGNOSIS — I5032 Chronic diastolic (congestive) heart failure: Secondary | ICD-10-CM | POA: Diagnosis not present

## 2014-12-25 DIAGNOSIS — J189 Pneumonia, unspecified organism: Secondary | ICD-10-CM

## 2014-12-25 DIAGNOSIS — I10 Essential (primary) hypertension: Secondary | ICD-10-CM | POA: Diagnosis not present

## 2014-12-25 DIAGNOSIS — E1122 Type 2 diabetes mellitus with diabetic chronic kidney disease: Secondary | ICD-10-CM | POA: Diagnosis not present

## 2014-12-25 DIAGNOSIS — J9601 Acute respiratory failure with hypoxia: Secondary | ICD-10-CM | POA: Diagnosis not present

## 2014-12-25 DIAGNOSIS — L03115 Cellulitis of right lower limb: Secondary | ICD-10-CM | POA: Diagnosis not present

## 2014-12-25 DIAGNOSIS — I480 Paroxysmal atrial fibrillation: Secondary | ICD-10-CM

## 2014-12-25 DIAGNOSIS — N189 Chronic kidney disease, unspecified: Secondary | ICD-10-CM

## 2014-12-25 DIAGNOSIS — Z950 Presence of cardiac pacemaker: Secondary | ICD-10-CM | POA: Diagnosis not present

## 2014-12-25 NOTE — Addendum Note (Signed)
Addended by: Merrilee SeashoreALEXANDER, Ninetta Adelstein D on: 12/25/2014 03:17 PM   Modules accepted: Orders

## 2014-12-25 NOTE — Progress Notes (Addendum)
MRN: [id number] Name: Leah [name]  Sex: female Age: 80 y.o. DOB: 1934/12/01  PSC #: [facility] Dandy Facility/Room: 219 Level Of Care: SNF Provider: Merrilee Seashore D Emergency Contacts: Extended Emergency Contact Information Primary Emergency Contact: Os[place],Russell Home Phone: [phone] Relation: Other  Code Status: DNR  Allergies: Diphenoxylate-atropine and Penicillins  Chief Complaint  Patient presents with  . Discharge Note    HPI: Patient is 80 y.o. female who was admitted generalized weakness  for OT/PT after hospitalization for aucte respiratory failure 2/2 PNA and chronic CHF who is now ready to be d/c to home.  Past Medical History  Diagnosis Date  . Type 2 diabetes mellitus 1972  . Hyperlipidemia   . HTN (hypertension)   . Morbid obesity   . AF (atrial fibrillation)   . Osteoporosis   . Vitamin B12 deficiency   . Low back pain   . Cellulitis of foot, right   . Diabetic retinopathy   . Ankle fracture, right 1972    Past Surgical History  Procedure Laterality Date  . Cholecystectomy  1985  . Pacemaker insertion      Medtronic      Medication List       This list is accurate as of: 12/25/14  3:15 PM.  Always use your most recent med list.               acetaminophen 325 MG tablet  Commonly known as:  TYLENOL  Take 650 mg by mouth every 6 (six) hours as needed for mild pain or moderate pain.     albuterol (2.5 MG/3ML) 0.083% nebulizer solution  Commonly known as:  PROVENTIL  Take 3 mLs (2.5 mg total) by nebulization every 4 (four) hours as needed for wheezing.     alendronate 70 MG tablet  Commonly known as:  FOSAMAX  Take 70 mg by mouth every 7 (seven) days. Take with a full glass of water on an empty stomach on Fridays.     aspirin 325 MG tablet  Take 1 tablet (325 mg total) by mouth daily.     atenolol 25 MG tablet  Commonly known as:  TENORMIN  Take 25-50 mg by mouth 2 (two) times daily. Give 50 mg in the morning and 25 mg in the  evening     atorvastatin 20 MG tablet  Commonly known as:  LIPITOR  Take 20 mg by mouth daily at 6 PM.     carbidopa-levodopa 25-100 MG per tablet  Commonly known as:  SINEMET IR  Take 1 tablet by mouth 3 (three) times daily.     digoxin 0.125 MG tablet  Commonly known as:  LANOXIN  Take 0.125 mg by mouth daily.     fish oil-omega-3 fatty acids 1000 MG capsule  Take 2 g by mouth at bedtime.     furosemide 20 MG tablet  Commonly known as:  LASIX  Take 1 tablet (20 mg total) by mouth daily.     glimepiride 4 MG tablet  Commonly known as:  AMARYL  Take 4 mg by mouth daily before breakfast.     insulin glargine 100 UNIT/ML injection  Commonly known as:  LANTUS  Inject 10 Units into the skin daily.     lisinopril 40 MG tablet  Commonly known as:  PRINIVIL,ZESTRIL  Take 40 mg by mouth at bedtime.     LORazepam 1 MG tablet  Commonly known as:  ATIVAN  Take 1 tablet (1 mg total) by mouth every 4 (four) hours  as needed for anxiety or sleep.     metFORMIN 1000 MG tablet  Commonly known as:  GLUCOPHAGE  Take 1,000 mg by mouth 2 (two) times daily with a meal.     MULTIVITAMIN & MINERAL PO  Take 1 tablet by mouth daily.     rOPINIRole 0.5 MG tablet  Commonly known as:  REQUIP  Take 0.5 mg by mouth at bedtime.     Vitamin D (Ergocalciferol) 50000 UNITS Caps capsule  Commonly known as:  DRISDOL  Take 50,000 Units by mouth every 30 (thirty) days.     warfarin 5 MG tablet  Commonly known as:  COUMADIN  Take 6 mg by mouth daily at 6 PM.        No orders of the defined types were placed in this encounter.     There is no immunization history on file for this patient.  History  Substance Use Topics  . Smoking status: Never Smoker   . Smokeless tobacco: Not on file  . Alcohol Use: No    Filed Vitals:   12/25/14 1425  BP: 161/72  Pulse: 65  Temp: 97.4 F (36.3 C)  Resp: 24    Physical Exam  GENERAL APPEARANCE: Alert, conversant. No acute distress.  HEENT:  Unremarkable. RESPIRATORY: Breathing is even, unlabored. Lung sounds are clear   CARDIOVASCULAR: Heart RRR no murmurs, rubs or gallops. No peripheral edema.  GASTROINTESTINAL: Abdomen is soft, non-tender, not distended w/ normal bowel sounds.  NEUROLOGIC: Cranial nerves 2-12 grossly intact. Moves all extremities  Patient Active Problem List   Diagnosis Date Noted  . Acute respiratory failure with hypoxia 12/10/2014  . Acute on chronic diastolic heart failure 12/10/2014  . Parkinson disease 12/10/2014  . Palliative care encounter 12/01/2014  . Dyspnea 12/01/2014  . Weakness generalized 12/01/2014  . DNR (do not resuscitate) 12/01/2014  . CAP (community acquired pneumonia)   . Severe sepsis 11/29/2014  . PNA (pneumonia) 11/29/2014  . Cellulitis of right leg 11/29/2014  . DM (diabetes mellitus), type 2 11/29/2014  . ATRIAL FLUTTER 09/01/2010  . Chronic diastolic heart failure 09/01/2010  . PPM-Medtronic 09/01/2010  . DM 08/31/2010  . HYPERLIPIDEMIA-MIXED 08/31/2010  . HYPERTENSION, BENIGN 08/31/2010  . Atrial fibrillation 08/31/2010  . Chronic kidney disease 08/31/2010    CBC    Component Value Date/Time   WBC 10.2 12/21/2014 1205   RBC 4.10 12/21/2014 1205   HGB 10.5* 12/21/2014 1205   HCT 34.5* 12/21/2014 1205   PLT 352 12/21/2014 1205   MCV 84.1 12/21/2014 1205   LYMPHSABS 2.0 12/21/2014 1205   MONOABS 1.0 12/21/2014 1205   EOSABS 0.3 12/21/2014 1205   BASOSABS 0.1 12/21/2014 1205    CMP     Component Value Date/Time   NA 139 12/21/2014 1205   K 4.6 12/21/2014 1205   CL 104 12/21/2014 1205   CO2 25 12/21/2014 1205   GLUCOSE 118* 12/21/2014 1205   BUN 11 12/21/2014 1205   CREATININE 0.96 12/21/2014 1205   CALCIUM 9.5 12/21/2014 1205   PROT 6.7 12/21/2014 1205   ALBUMIN 2.8* 12/21/2014 1205   AST 33 12/21/2014 1205   ALT 8 12/21/2014 1205   ALKPHOS 85 12/21/2014 1205   BILITOT 0.9 12/21/2014 1205   GFRNONAA 55* 12/21/2014 1205   GFRAA 64* 12/21/2014 1205     Assessment and Plan  Pt is stable for d/c to home with HH/OT/PT/nursing/wound care. Pt will need PT/INR and CBC within 5 days at her PCP Dr. Rodrigo RanMark Perini.  Hennie Duos, MD

## 2015-05-12 ENCOUNTER — Telehealth: Payer: Self-pay | Admitting: Internal Medicine

## 2015-05-12 NOTE — Telephone Encounter (Signed)
New Message  Participates in CHF program. Would like to request the latest ejection fraction results   Fax # is : [phone]

## 2015-05-12 NOTE — Telephone Encounter (Signed)
Last one 2004 60%

## 2015-08-21 ENCOUNTER — Encounter: Payer: Self-pay | Admitting: Internal Medicine

## 2015-08-21 ENCOUNTER — Ambulatory Visit (INDEPENDENT_AMBULATORY_CARE_PROVIDER_SITE_OTHER): Payer: Medicare Other | Admitting: Internal Medicine

## 2015-08-21 VITALS — BP 160/100 | HR 60 | Ht 64.0 in | Wt 196.0 lb

## 2015-08-21 DIAGNOSIS — Z950 Presence of cardiac pacemaker: Secondary | ICD-10-CM

## 2015-08-21 DIAGNOSIS — I10 Essential (primary) hypertension: Secondary | ICD-10-CM | POA: Diagnosis not present

## 2015-08-21 DIAGNOSIS — I480 Paroxysmal atrial fibrillation: Secondary | ICD-10-CM

## 2015-08-21 DIAGNOSIS — I5032 Chronic diastolic (congestive) heart failure: Secondary | ICD-10-CM | POA: Diagnosis not present

## 2015-08-21 NOTE — Assessment & Plan Note (Signed)
She has maintained NSR 90% of the time. No change in her meds.

## 2015-08-21 NOTE — Assessment & Plan Note (Signed)
Her blood pressure remains elevated but is better when she is not in the doctors office. We could consider [facility] but I will defer to her primary MD.

## 2015-08-21 NOTE — Assessment & Plan Note (Signed)
Her symptoms are class 2. She is encouraged to maintain a low sodium diet. 

## 2015-08-21 NOTE — Assessment & Plan Note (Signed)
Her medtronic DDD PM is working normally. Will recheck in several months. 

## 2015-08-21 NOTE — Progress Notes (Signed)
HPI [name] returns today for followup. She is a very pleasant 80 year old woman with a history of symptomatic bradycardia, hypertension, paroxysmal atrial fibrillation, and dyslipidemia. She is status post permanent pacemaker insertion. In the interim, she has been stable except for some weight loss, and denies palpitations, chest pain, or shortness of breath. No syncope. Allergies  Allergen Reactions  . Diphenoxylate-Atropine Other (See Comments)    Unknown reaction  . Penicillins Hives and Itching     Current Outpatient Prescriptions  Medication Sig Dispense Refill  . acetaminophen (TYLENOL) 325 MG tablet Take 650 mg by mouth every 6 (six) hours as needed for mild pain or moderate pain.    Marland Kitchen. albuterol (PROVENTIL) (2.5 MG/3ML) 0.083% nebulizer solution Take 3 mLs (2.5 mg total) by nebulization every 4 (four) hours as needed for wheezing. 75 mL 1  . alendronate (FOSAMAX) 70 MG tablet Take 70 mg by mouth every 7 (seven) days. Take with a full glass of water on an empty stomach on Fridays.    Marland Kitchen. aspirin 325 MG tablet Take 1 tablet (325 mg total) by mouth daily. 30 tablet 0  . atenolol (TENORMIN) 25 MG tablet Take 25-50 mg by mouth 2 (two) times daily. Give 50 mg in the morning and 25 mg in the evening    . atorvastatin (LIPITOR) 20 MG tablet Take 20 mg by mouth daily at 6 PM.     . carbidopa-levodopa (SINEMET IR) 25-100 MG per tablet Take 1 tablet by mouth 3 (three) times daily.    . digoxin (LANOXIN) 0.125 MG tablet Take 0.125 mg by mouth daily.    . fish oil-omega-3 fatty acids 1000 MG capsule Take 2 g by mouth at bedtime.     . furosemide (LASIX) 20 MG tablet Take 1 tablet (20 mg total) by mouth daily. 30 tablet 0  . glimepiride (AMARYL) 4 MG tablet Take 4 mg by mouth daily before breakfast.    . insulin glargine (LANTUS) 100 UNIT/ML injection Inject 10 Units into the skin daily.     Marland Kitchen. lisinopril (PRINIVIL,ZESTRIL) 40 MG tablet Take 40 mg by mouth at bedtime.     Marland Kitchen. LORazepam (ATIVAN)  1 MG tablet Take 1 tablet (1 mg total) by mouth every 4 (four) hours as needed for anxiety or sleep. 30 tablet 0  . metFORMIN (GLUCOPHAGE) 1000 MG tablet Take 1,000 mg by mouth 2 (two) times daily with a meal.    . Multiple Vitamins-Minerals (MULTIVITAMIN & MINERAL PO) Take 1 tablet by mouth daily.    Marland Kitchen. rOPINIRole (REQUIP) 0.5 MG tablet Take 0.5 mg by mouth at bedtime.    . Vitamin D, Ergocalciferol, (DRISDOL) 50000 UNITS CAPS capsule Take 50,000 Units by mouth every 30 (thirty) days.    Marland Kitchen. warfarin (COUMADIN) 5 MG tablet Take 6 mg by mouth daily at 6 PM.      No current facility-administered medications for this visit.     Past Medical History  Diagnosis Date  . Type 2 diabetes mellitus (HCC) 1972  . Hyperlipidemia   . HTN (hypertension)   . Morbid obesity (HCC)   . AF (atrial fibrillation) (HCC)   . Osteoporosis   . Vitamin B12 deficiency   . Low back pain   . Cellulitis of foot, right   . Diabetic retinopathy   . Ankle fracture, right 1972    ROS:   All systems reviewed and negative except as noted in the HPI.   Past Surgical History  Procedure Laterality Date  . Cholecystectomy  1985  . Pacemaker insertion      Medtronic     Family History  Problem Relation Age of Onset  . Cancer Mother   . Heart attack Father   . Cancer Brother      Social History   Social History  . Marital Status: Married    Spouse Name: N/A  . Number of Children: N/A  . Years of Education: N/A   Occupational History  . Not on file.   Social History Main Topics  . Smoking status: Never Smoker   . Smokeless tobacco: Not on file  . Alcohol Use: No  . Drug Use: No  . Sexual Activity: Not on file   Other Topics Concern  . Not on file   Social History Narrative     BP 160/100 mmHg  Pulse 60  Ht  (1.626 m)  Wt 196 lb (88.905 kg)  BMI 33.63 kg/m2  Physical Exam:  Chronically ill but stable appearing 80 year old woman,NAD HEENT: Unremarkable Neck:  7 cm JVD, no  thyromegally Lungs:  Clear with no wheezes, rales, or rhonchi. HEART:  Regular rate rhythm, no murmurs, no rubs, no clicks, split S2 Abd:  soft, positive bowel sounds, no organomegally, no rebound, no guarding Ext:  2 plus pulses, trace peripheral edema, no cyanosis, no clubbing Skin:  No rashes no nodules Neuro:  CN II through XII intact, motor grossly intact   DEVICE  Normal device function.  See PaceArt for details.   Assess/Plan:

## 2015-08-21 NOTE — Patient Instructions (Signed)
Medication Instructions:  Your physician recommends that you continue on your current medications as directed. Please refer to the Current Medication list given to you today.  Labwork: None ordered  Testing/Procedures: None ordered  Follow-Up: Remote monitoring is used to monitor your Pacemaker of ICD from home. This monitoring reduces the number of office visits required to check your device to one time per year. It allows us to keep an eye on the functioning of your device to ensure it is working properly. You are scheduled for a device check from home on 11/23/15. You may send your transmission at any time that day. If you have a wireless device, the transmission will be sent automatically. After your physician reviews your transmission, you will receive a postcard with your next transmission date.  Your physician wants you to follow-up in: 1 year with Dr. Taylor. You will receive a reminder letter in the mail two months in advance. If you don't receive a letter, please call our office to schedule the follow-up appointment.  If you need a refill on your cardiac medications before your next appointment, please call your pharmacy.  Thank you for choosing [facility]!!        

## 2015-09-02 LAB — CUP PACEART INCLINIC DEVICE CHECK
Battery Remaining Longevity: 82 mo
Battery Voltage: 2.78 V
Implantable Lead Implant Date: 20101119
Implantable Lead Location: 753860
Implantable Lead Model: 5076
Lead Channel Pacing Threshold Amplitude: 0.5 V
Lead Channel Pacing Threshold Amplitude: 0.75 V
Lead Channel Pacing Threshold Pulse Width: 0.4 ms
Lead Channel Pacing Threshold Pulse Width: 0.4 ms
Lead Channel Sensing Intrinsic Amplitude: 0.35 mV
Lead Channel Setting Pacing Amplitude: 2.5 V
Lead Channel Setting Sensing Sensitivity: 2 mV
MDC IDC LEAD IMPLANT DT: 20101119
MDC IDC LEAD LOCATION: 753859
MDC IDC MSMT BATTERY IMPEDANCE: 471 Ohm
MDC IDC MSMT LEADCHNL RA IMPEDANCE VALUE: 479 Ohm
MDC IDC MSMT LEADCHNL RV IMPEDANCE VALUE: 582 Ohm
MDC IDC SESS DTM: 20161209210950
MDC IDC SET LEADCHNL RA PACING AMPLITUDE: 2 V
MDC IDC SET LEADCHNL RV PACING PULSEWIDTH: 0.4 ms
MDC IDC STAT BRADY AP VP PERCENT: 87 %
MDC IDC STAT BRADY AP VS PERCENT: 0 %
MDC IDC STAT BRADY AS VP PERCENT: 13 %
MDC IDC STAT BRADY AS VS PERCENT: 0 %

## 2015-11-23 ENCOUNTER — Telehealth: Payer: Self-pay | Admitting: Cardiology

## 2015-11-23 ENCOUNTER — Ambulatory Visit (INDEPENDENT_AMBULATORY_CARE_PROVIDER_SITE_OTHER): Payer: Medicare Other | Admitting: *Deleted

## 2015-11-23 DIAGNOSIS — I480 Paroxysmal atrial fibrillation: Secondary | ICD-10-CM | POA: Diagnosis not present

## 2015-11-23 DIAGNOSIS — Z950 Presence of cardiac pacemaker: Secondary | ICD-10-CM | POA: Diagnosis not present

## 2015-11-23 NOTE — Telephone Encounter (Signed)
Spoke with pt and reminded pt of remote transmission that is due today. Pt verbalized understanding.   

## 2015-11-24 NOTE — Progress Notes (Signed)
Remote pacemaker transmission.   

## 2015-12-03 LAB — CUP PACEART REMOTE DEVICE CHECK
Battery Remaining Longevity: 74 mo
Battery Voltage: 2.78 V
Brady Statistic AS VS Percent: 0 %
Implantable Lead Implant Date: 20101119
Lead Channel Impedance Value: 557 Ohm
Lead Channel Pacing Threshold Amplitude: 0.5 V
Lead Channel Pacing Threshold Pulse Width: 0.4 ms
Lead Channel Setting Pacing Amplitude: 2 V
Lead Channel Setting Pacing Pulse Width: 0.4 ms
Lead Channel Setting Sensing Sensitivity: 2 mV
MDC IDC LEAD IMPLANT DT: 20101119
MDC IDC LEAD LOCATION: 753859
MDC IDC LEAD LOCATION: 753860
MDC IDC MSMT BATTERY IMPEDANCE: 569 Ohm
MDC IDC MSMT LEADCHNL RA IMPEDANCE VALUE: 472 Ohm
MDC IDC MSMT LEADCHNL RA PACING THRESHOLD AMPLITUDE: 1 V
MDC IDC MSMT LEADCHNL RA PACING THRESHOLD PULSEWIDTH: 0.4 ms
MDC IDC SESS DTM: 20170313172444
MDC IDC SET LEADCHNL RV PACING AMPLITUDE: 2.5 V
MDC IDC STAT BRADY AP VP PERCENT: 93 %
MDC IDC STAT BRADY AP VS PERCENT: 0 %
MDC IDC STAT BRADY AS VP PERCENT: 7 %

## 2015-12-07 ENCOUNTER — Encounter: Payer: Self-pay | Admitting: Cardiology

## 2016-02-22 ENCOUNTER — Telehealth: Payer: Self-pay | Admitting: Cardiology

## 2016-02-22 ENCOUNTER — Ambulatory Visit (INDEPENDENT_AMBULATORY_CARE_PROVIDER_SITE_OTHER): Payer: Medicare Other | Admitting: *Deleted

## 2016-02-22 DIAGNOSIS — Z950 Presence of cardiac pacemaker: Secondary | ICD-10-CM | POA: Diagnosis not present

## 2016-02-22 DIAGNOSIS — I480 Paroxysmal atrial fibrillation: Secondary | ICD-10-CM | POA: Diagnosis not present

## 2016-02-22 NOTE — Telephone Encounter (Signed)
Spoke with pt and reminded pt of remote transmission that is due today. Pt verbalized understanding.   

## 2016-02-23 NOTE — Progress Notes (Signed)
Remote pacemaker transmission.   

## 2016-02-25 LAB — CUP PACEART REMOTE DEVICE CHECK
Battery Remaining Longevity: 71 mo
Brady Statistic AP VP Percent: 90 %
Brady Statistic AP VS Percent: 0 %
Brady Statistic AS VP Percent: 10 %
Date Time Interrogation Session: 20170612182211
Implantable Lead Implant Date: 20101119
Implantable Lead Location: 753859
Implantable Lead Location: 753860
Implantable Lead Model: 5076
Lead Channel Impedance Value: 445 Ohm
Lead Channel Pacing Threshold Amplitude: 0.5 V
Lead Channel Pacing Threshold Amplitude: 1 V
Lead Channel Pacing Threshold Pulse Width: 0.4 ms
Lead Channel Pacing Threshold Pulse Width: 0.4 ms
Lead Channel Setting Pacing Amplitude: 2.5 V
Lead Channel Setting Pacing Pulse Width: 0.4 ms
Lead Channel Setting Sensing Sensitivity: 2 mV
MDC IDC LEAD IMPLANT DT: 20101119
MDC IDC MSMT BATTERY IMPEDANCE: 619 Ohm
MDC IDC MSMT BATTERY VOLTAGE: 2.78 V
MDC IDC MSMT LEADCHNL RV IMPEDANCE VALUE: 546 Ohm
MDC IDC SET LEADCHNL RA PACING AMPLITUDE: 2 V
MDC IDC STAT BRADY AS VS PERCENT: 0 %

## 2016-03-03 ENCOUNTER — Encounter: Payer: Self-pay | Admitting: Cardiology

## 2016-05-24 ENCOUNTER — Ambulatory Visit (INDEPENDENT_AMBULATORY_CARE_PROVIDER_SITE_OTHER): Payer: Medicare Other | Admitting: *Deleted

## 2016-05-24 ENCOUNTER — Telehealth: Payer: Self-pay | Admitting: Cardiology

## 2016-05-24 DIAGNOSIS — Z950 Presence of cardiac pacemaker: Secondary | ICD-10-CM

## 2016-05-24 DIAGNOSIS — I480 Paroxysmal atrial fibrillation: Secondary | ICD-10-CM

## 2016-05-24 NOTE — Telephone Encounter (Signed)
Spoke with pt and reminded pt of remote transmission that is due today. Pt verbalized understanding.   

## 2016-05-25 NOTE — Progress Notes (Signed)
Remote pacemaker transmission.   

## 2016-05-26 ENCOUNTER — Encounter: Payer: Self-pay | Admitting: Cardiology

## 2016-06-02 LAB — CUP PACEART REMOTE DEVICE CHECK
Brady Statistic AP VS Percent: 0 %
Brady Statistic AS VP Percent: 10 %
Brady Statistic AS VS Percent: 0 %
Implantable Lead Implant Date: 20101119
Implantable Lead Location: 753859
Implantable Lead Location: 753860
Implantable Lead Model: 5076
Implantable Lead Model: 5076
Lead Channel Impedance Value: 560 Ohm
Lead Channel Pacing Threshold Amplitude: 1 V
Lead Channel Pacing Threshold Pulse Width: 0.4 ms
Lead Channel Pacing Threshold Pulse Width: 0.4 ms
Lead Channel Setting Pacing Amplitude: 2.5 V
Lead Channel Setting Sensing Sensitivity: 2 mV
MDC IDC LEAD IMPLANT DT: 20101119
MDC IDC MSMT BATTERY IMPEDANCE: 644 Ohm
MDC IDC MSMT BATTERY REMAINING LONGEVITY: 70 mo
MDC IDC MSMT BATTERY VOLTAGE: 2.78 V
MDC IDC MSMT LEADCHNL RA IMPEDANCE VALUE: 458 Ohm
MDC IDC MSMT LEADCHNL RV PACING THRESHOLD AMPLITUDE: 0.5 V
MDC IDC SESS DTM: 20170912183553
MDC IDC SET LEADCHNL RA PACING AMPLITUDE: 2 V
MDC IDC SET LEADCHNL RV PACING PULSEWIDTH: 0.4 ms
MDC IDC STAT BRADY AP VP PERCENT: 90 %

## 2016-06-10 ENCOUNTER — Ambulatory Visit: Payer: Medicare Other | Admitting: Diagnostic Neuroimaging

## 2016-06-21 ENCOUNTER — Ambulatory Visit (INDEPENDENT_AMBULATORY_CARE_PROVIDER_SITE_OTHER): Payer: Medicare Other | Admitting: Diagnostic Neuroimaging

## 2016-06-21 ENCOUNTER — Encounter: Payer: Self-pay | Admitting: Diagnostic Neuroimaging

## 2016-06-21 VITALS — BP 151/63 | HR 63 | Ht 64.0 in | Wt 206.8 lb

## 2016-06-21 DIAGNOSIS — G20 Parkinson's disease: Secondary | ICD-10-CM

## 2016-06-21 MED ORDER — CARBIDOPA-LEVODOPA 25-100 MG PO TABS: 2.0000 | ORAL_TABLET | Freq: Three times a day (TID) | ORAL | 4 refills | Status: DC

## 2016-06-21 NOTE — Patient Instructions (Signed)
[facility]. I hope we have been able to provide you high quality care today.  You may receive a patient satisfaction survey over the next few weeks. We would appreciate your feedback and comments so that we may continue to improve ourselves and the health of our patients.  - increase carbidopa/levodopa to 2 tabs three times per day (total 6 per day); may increase gradually over next few weeks     ~~~~~~~~~~~~~~~~~~~~~~~~~~~~~~~~~~~~~~~~~~~~~~~~~~~~~~~~~~~~~~~~~  DR. PENUMALLI'S GUIDE TO HAPPY AND HEALTHY LIVING These are some of my general health and wellness recommendations. Some of them may apply to you better than others. Please use common sense as you try these suggestions and feel free to ask me any questions.   NUTRITION Eat more plants.  Eat less sugar, salt, preservatives and processed foods.  Avoid toxins such as cigarettes and alcohol.  Drink water when you are thirsty. Warm water with a slice of lemon is an excellent morning drink to start the day.   PLANNING Prepare estate planning, living will, healthcare POA documents. Sometimes this is best planned with the help of an attorney. Theconversationproject.org and agingwithdignity.org are excellent resources.

## 2016-06-21 NOTE — Progress Notes (Signed)
[facility]  PATIENT: Leah [name] DOB: August 15, 1935  REFERRING CLINICIAN: M Perini HISTORY FROM: patient and daughter  REASON FOR VISIT: new consult / existing patient   HISTORICAL  CHIEF COMPLAINT:  Chief Complaint  Patient presents with  . New Patient (Initial Visit)    RM 6 with daughter, Fannie Knee. Paper referral from Dr Waynard Edwards for Parkinson's. Saw Dr Marjory Lies about 3-4 years ago per pt.     HISTORY OF PRESENT ILLNESS:   NEW HPI (06/21/16): Since last visit, has continued on carb/levo. Parkinson's dz has progressed. Tremors continue. Having bowel and bladder control issues. More restless legs. Lives with daughter and grand-daughter. She is alone for several hours a day.   UPDATE 04/02/12: Doing well. No on/off fluctations. No wearing off. No dyskinesias. Some hoarse voice, and word finding diff developing. No falls. Using a walker. Some shoulder pain.  UPDATE 10/05/11: Patient started on carbidopa/levodopa, now taking one tablet 3 times a day, and feels better. She feels that her coordination and balance have improved. No side effects from medication. Her left arm still has trouble with coordination.  PRIOR HPI: 81 year old right-handed female with history of hypertension, diabetes, hypercholesterolemia, atrial fibrillation, here for evaluation of progressive gait difficulty, tremor, memory loss, hoarseness of voice. Symptoms started approximately 1-2 yrs ago and have been progressive. She has had multiple falls.  She started using a rolling walker with brakes several weeks ago. She has noted deterioration of her handwriting. She has difficulty cooking particularly with repetitive motion such as beating an egg.  She has difficulty with dressing and other activities of daily living.  She also reports difficulty with getting her words out, memory loss and hoarse voice. She gets easily frustrated. She does report some anxiety. No constipation. No change in smell or  taste. No sleep disturbance.   REVIEW OF SYSTEMS: Full 14 system review of systems performed and negative with exception of: trouble swallowing swelling in legs weight loss increased thirst easy bruising cough shortness of breath.    ALLERGIES: Allergies  Allergen Reactions  . Diphenoxylate-Atropine Other (See Comments)    Unknown reaction  . Penicillins Hives and Itching    HOME MEDICATIONS: Outpatient Medications Prior to Visit  Medication Sig Dispense Refill  . acetaminophen (TYLENOL) 325 MG tablet Take 650 mg by mouth every 6 (six) hours as needed for mild pain or moderate pain.    Marland Kitchen albuterol (PROVENTIL) (2.5 MG/3ML) 0.083% nebulizer solution Take 3 mLs (2.5 mg total) by nebulization every 4 (four) hours as needed for wheezing. 75 mL 1  . alendronate (FOSAMAX) 70 MG tablet Take 70 mg by mouth every 7 (seven) days. Take with a full glass of water on an empty stomach on Fridays.    Marland Kitchen aspirin 325 MG tablet Take 1 tablet (325 mg total) by mouth daily. 30 tablet 0  . atenolol (TENORMIN) 25 MG tablet Take 25-50 mg by mouth 2 (two) times daily. Give 50 mg in the morning and 25 mg in the evening    . atorvastatin (LIPITOR) 20 MG tablet Take 20 mg by mouth daily at 6 PM.     . carbidopa-levodopa (SINEMET IR) 25-100 MG per tablet Take 1 tablet by mouth 3 (three) times daily.    . digoxin (LANOXIN) 0.125 MG tablet Take 0.125 mg by mouth daily.    . fish oil-omega-3 fatty acids 1000 MG capsule Take 2 g by mouth at bedtime.     . furosemide (LASIX) 20 MG tablet Take 1  tablet (20 mg total) by mouth daily. 30 tablet 0  . glimepiride (AMARYL) 4 MG tablet Take 4 mg by mouth daily before breakfast.    . insulin glargine (LANTUS) 100 UNIT/ML injection Inject 12 Units into the skin daily.     Marland Kitchen. lisinopril (PRINIVIL,ZESTRIL) 40 MG tablet Take 40 mg by mouth at bedtime.     Marland Kitchen. LORazepam (ATIVAN) 1 MG tablet Take 1 tablet (1 mg total) by mouth every 4 (four) hours as needed for anxiety or sleep. 30 tablet  0  . metFORMIN (GLUCOPHAGE) 1000 MG tablet Take 1,000 mg by mouth 2 (two) times daily with a meal.    . Multiple Vitamins-Minerals (MULTIVITAMIN & MINERAL PO) Take 1 tablet by mouth daily.    Marland Kitchen. rOPINIRole (REQUIP) 0.5 MG tablet Take 0.5 mg by mouth at bedtime.    . Vitamin D, Ergocalciferol, (DRISDOL) 50000 UNITS CAPS capsule Take 50,000 Units by mouth every 30 (thirty) days.    Marland Kitchen. warfarin (COUMADIN) 5 MG tablet Take 6 mg by mouth daily at 6 PM.      No facility-administered medications prior to visit.     PAST MEDICAL HISTORY: Past Medical History:  Diagnosis Date  . AF (atrial fibrillation) (HCC)   . Ankle fracture, right 1972  . Cellulitis of foot, right   . Diabetic retinopathy   . HTN (hypertension)   . Hyperlipidemia   . Low back pain   . Morbid obesity (HCC)   . Osteoporosis   . Type 2 diabetes mellitus (HCC) 1972  . Vitamin B12 deficiency     PAST SURGICAL HISTORY: Past Surgical History:  Procedure Laterality Date  . CHOLECYSTECTOMY  1985  . PACEMAKER INSERTION  around 2010 per pt daughter   [employer]    FAMILY HISTORY: Family History  Problem Relation Age of Onset  . Cancer Mother   . Heart attack Father   . Cancer Brother     SOCIAL HISTORY:  Social History   Social History  . Marital status: Married    Spouse name: N/A  . Number of children: 3  . Years of education: 12   Occupational History  . Retired    Social History Main Topics  . Smoking status: Never Smoker  . Smokeless tobacco: Never Used  . Alcohol use No  . Drug use: No  . Sexual activity: Not on file   Other Topics Concern  . Not on file   Social History Narrative   Granddaughter lives with her and her daughter   Caffeine use: Drinks 1 soda/week   Drinks water mostly    Coffee daily     PHYSICAL EXAM  GENERAL EXAM/CONSTITUTIONAL: Vitals:  Vitals:   06/21/16 1508  BP: (!) 151/63  Pulse: 63  Weight: 206 lb 12.8 oz (93.8 kg)  Height: 5\' 4"  (1.626 m)     Body mass  index is 35.5 kg/m.  No exam data present  Patient is in no distress; well developed, nourished and groomed; neck is supple  CARDIOVASCULAR:  Examination of carotid arteries is normal; no carotid bruits  Regular rate and rhythm, no murmurs  Examination of peripheral vascular system by observation and palpation is normal  EYES:  Ophthalmoscopic exam of optic discs and posterior segments is normal; no papilledema or hemorrhages  MUSCULOSKELETAL:  Gait, strength, tone, movements noted in Neurologic exam below  NEUROLOGIC: MENTAL STATUS:  No flowsheet data found.  awake, alert, oriented to person, place and time  recent and remote memory intact  normal attention and concentration  SLIGHTLY DECR FLUENCY; comprehension intact, naming intact,   fund of knowledge appropriate  CRANIAL NERVE:   2nd - no papilledema on fundoscopic exam  2nd, 3rd, 4th, 6th - pupils equal and reactive to light, visual fields full to confrontation, extraocular muscles intact, no nystagmus  5th - facial sensation symmetric  7th - facial strength symmetric  8th - hearing intact  9th - palate elevates symmetrically, uvula midline  11th - shoulder shrug symmetric  12th - tongue protrusion midline  HOARSE, SLOW SPEECH  MOTOR:   normal bulk   COGWHEELING IN LUE > RUE  BUE 4 PROX, 3 DISTAL  BLE 2-3 PROX, 2 DISTAL  RESTING TREMOR IN BUE  BRADYKINESIA IN BUE AND BLE  SENSORY:   normal and symmetric to light touch  COORDINATION:   finger-nose-finger, fine finger movements normal  REFLEXES:   deep tendon reflexes TRACE and symmetric  GAIT/STATION:   IN TRANSPORT CHAIR    DIAGNOSTIC DATA (LABS, IMAGING, TESTING) - I reviewed patient records, labs, notes, testing and imaging myself where available.  Lab Results  Component Value Date   WBC 10.2 12/21/2014   HGB 10.5 (L) 12/21/2014   HCT 34.5 (L) 12/21/2014   MCV 84.1 12/21/2014   PLT 352 12/21/2014        Component Value Date/Time   NA 139 12/21/2014 1205   K 4.6 12/21/2014 1205   CL 104 12/21/2014 1205   CO2 25 12/21/2014 1205   GLUCOSE 118 (H) 12/21/2014 1205   BUN 11 12/21/2014 1205   CREATININE 0.96 12/21/2014 1205   CALCIUM 9.5 12/21/2014 1205   PROT 6.7 12/21/2014 1205   ALBUMIN 2.8 (L) 12/21/2014 1205   AST 33 12/21/2014 1205   ALT 8 12/21/2014 1205   ALKPHOS 85 12/21/2014 1205   BILITOT 0.9 12/21/2014 1205   GFRNONAA 55 (L) 12/21/2014 1205   GFRAA 64 (L) 12/21/2014 1205   No results found for: CHOL, HDL, LDLCALC, LDLDIRECT, TRIG, CHOLHDL Lab Results  Component Value Date   HGBA1C (H) 07/31/2009    6.7 (NOTE) The ADA recommends the following therapeutic goal for glycemic control related to Hgb A1c measurement: Goal of therapy: <6.5 Hgb A1c  Reference: American Diabetes Association: Clinical Practice Recommendations 2010, Diabetes Care, 2010, 33: (Suppl  1).   No results found for: VITAMINB12 Lab Results  Component Value Date   TSH 2.184 Test methodology is 3rd generation TSH 07/31/2009    07/04/11 CT head (without contrast)  - mild-moderate chronic small vessel ischemic disease.    ASSESSMENT AND PLAN  81 y.o. year old female with multiple vascular risk factors, with progressive gait decline, tremor, word finding difficulty, difficulty with fine finger movements and tasks.  Has subjective memory complaints.  Trial of carb/levo has helped in the past.   Now with significant progression, gait difficulty and general decline. Will adjust medication and refer to home health therapy.   Dx: parkinson's disease  Parkinson disease (HCC) - Plan: Ambulatory referral to Home Health   PLAN: - increase carbidopa/levodopa to 2 tabs three times per day - home health referral  Orders Placed This Encounter  Procedures  . Ambulatory referral to Home Health   Meds ordered this encounter  Medications  . carbidopa-levodopa (SINEMET IR) 25-100 MG tablet    Sig: Take 2  tablets by mouth 3 (three) times daily.    Dispense:  540 tablet    Refill:  4   Return in about 6 months (around 12/20/2016).  Suanne Marker, MD 06/21/2016, 3:33 PM Certified in Neurology, Neurophysiology and Neuroimaging  [facility] [address] [place], [place] [address] [phone]

## 2016-06-27 ENCOUNTER — Telehealth: Payer: Self-pay | Admitting: Diagnostic Neuroimaging

## 2016-06-27 NOTE — Telephone Encounter (Signed)
Sent order to [facility] 

## 2016-06-29 NOTE — Telephone Encounter (Signed)
Revonda Standardllison Mallroy/[facility] [phone] request verbal orders for speech 1 x 4 to address swallowing initially.

## 2016-06-29 NOTE — Telephone Encounter (Signed)
LVM advising Revonda Standardllison that Dr Marjory LiesPenumalli gives verbal order for speech therapy 1 x 4 to address patient's swallowing initially. Left name, number for any questions.

## 2016-09-14 ENCOUNTER — Encounter: Payer: Medicare Other | Admitting: Internal Medicine

## 2016-10-05 ENCOUNTER — Encounter: Payer: Self-pay | Admitting: Internal Medicine

## 2016-10-05 ENCOUNTER — Ambulatory Visit (INDEPENDENT_AMBULATORY_CARE_PROVIDER_SITE_OTHER): Payer: Medicare Other | Admitting: Internal Medicine

## 2016-10-05 ENCOUNTER — Encounter (INDEPENDENT_AMBULATORY_CARE_PROVIDER_SITE_OTHER): Payer: Self-pay

## 2016-10-05 VITALS — BP 128/76 | HR 67 | Ht 64.0 in | Wt 200.2 lb

## 2016-10-05 DIAGNOSIS — I480 Paroxysmal atrial fibrillation: Secondary | ICD-10-CM | POA: Diagnosis not present

## 2016-10-05 LAB — CUP PACEART INCLINIC DEVICE CHECK
Battery Impedance: 719 Ohm
Battery Remaining Longevity: 66 mo
Brady Statistic AP VP Percent: 90 %
Brady Statistic AS VS Percent: 0 %
Date Time Interrogation Session: 20180124164530
Implantable Lead Implant Date: 20101119
Implantable Lead Implant Date: 20101119
Implantable Lead Location: 753859
Implantable Lead Location: 753860
Implantable Lead Model: 5076
Implantable Lead Model: 5076
Implantable Pulse Generator Implant Date: 20101119
Lead Channel Impedance Value: 446 Ohm
Lead Channel Pacing Threshold Amplitude: 0.75 V
Lead Channel Pacing Threshold Amplitude: 0.875 V
Lead Channel Pacing Threshold Pulse Width: 0.4 ms
Lead Channel Pacing Threshold Pulse Width: 0.4 ms
Lead Channel Pacing Threshold Pulse Width: 0.4 ms
Lead Channel Sensing Intrinsic Amplitude: 1.4 mV
Lead Channel Setting Pacing Amplitude: 2 V
Lead Channel Setting Pacing Amplitude: 2.5 V
Lead Channel Setting Pacing Pulse Width: 0.4 ms
MDC IDC MSMT BATTERY VOLTAGE: 2.78 V
MDC IDC MSMT LEADCHNL RA PACING THRESHOLD PULSEWIDTH: 0.4 ms
MDC IDC MSMT LEADCHNL RV IMPEDANCE VALUE: 562 Ohm
MDC IDC MSMT LEADCHNL RV PACING THRESHOLD AMPLITUDE: 0.5 V
MDC IDC MSMT LEADCHNL RV PACING THRESHOLD AMPLITUDE: 0.5 V
MDC IDC SET LEADCHNL RV SENSING SENSITIVITY: 2 mV
MDC IDC STAT BRADY AP VS PERCENT: 0 %
MDC IDC STAT BRADY AS VP PERCENT: 10 %

## 2016-10-05 NOTE — Patient Instructions (Signed)
Medication Instructions:  Your physician recommends that you continue on your current medications as directed. Please refer to the Current Medication list given to you today.   Labwork: None Ordered   Testing/Procedures: None Ordered   Follow-Up: Your physician wants you to follow-up in: 1 year with Dr. Taylor.  You will receive a reminder letter in the mail two months in advance. If you don't receive a letter, please call our office to schedule the follow-up appointment.  Remote monitoring is used to monitor your Pacemaker from home. This monitoring reduces the number of office visits required to check your device to one time per year. It allows us to keep an eye on the functioning of your device to ensure it is working properly. You are scheduled for a device check from home on 01/04/17. You may send your transmission at any time that day. If you have a wireless device, the transmission will be sent automatically. After your physician reviews your transmission, you will receive a postcard with your next transmission date.     Any Other Special Instructions Will Be Listed Below (If Applicable).     If you need a refill on your cardiac medications before your next appointment, please call your pharmacy.   

## 2016-10-05 NOTE — Progress Notes (Signed)
HPI Leah [name] returns today for followup. She is a very pleasant 82 year old woman with a history of symptomatic bradycardia, hypertension, paroxysmal atrial fibrillation, and dyslipidemia. She is status post permanent pacemaker insertion, now with CHB. In the interim, she has been stable except for some weight loss, and denies palpitations, chest pain, or shortness of breath. No syncope. She has chronic venous insufficiency. Allergies  Allergen Reactions  . Diphenoxylate-Atropine Other (See Comments)    Unknown reaction  . Penicillins Hives and Itching     Current Outpatient Prescriptions  Medication Sig Dispense Refill  . alendronate (FOSAMAX) 70 MG tablet Take 70 mg by mouth every 7 (seven) days. Take with a full glass of water on an empty stomach on Fridays.    Marland Kitchen. atenolol (TENORMIN) 25 MG tablet Take 25-50 mg by mouth 2 (two) times daily. Give 50 mg in the morning and 25 mg in the evening    . atorvastatin (LIPITOR) 20 MG tablet Take 20 mg by mouth daily at 6 PM.     . carbidopa-levodopa (SINEMET IR) 25-100 MG tablet Take 2 tablets by mouth 3 (three) times daily. 540 tablet 4  . cephALEXin (KEFLEX) 250 MG capsule Take 250 mg by mouth daily.    . cyanocobalamin 2000 MCG tablet Take 2,000 mcg by mouth daily.    . digoxin (LANOXIN) 0.125 MG tablet Take 0.125 mg by mouth daily.    . fish oil-omega-3 fatty acids 1000 MG capsule Take 2 g by mouth at bedtime.     . furosemide (LASIX) 20 MG tablet Take 1 tablet (20 mg total) by mouth daily. 30 tablet 0  . glimepiride (AMARYL) 4 MG tablet Take 4 mg by mouth daily before breakfast.    . hydrochlorothiazide (HYDRODIURIL) 25 MG tablet Take 25 mg by mouth daily.    Marland Kitchen. HYDROcodone-acetaminophen (NORCO/VICODIN) 5-325 MG tablet Take 1 tablet by mouth 2 (two) times daily as needed for moderate pain.    Marland Kitchen. insulin glargine (LANTUS) 100 UNIT/ML injection Inject 12 Units into the skin daily.     Marland Kitchen. L-Methylfolate-B6-B12 (FOLTANX PO) Take 1 tablet by mouth  daily.    Marland Kitchen. lisinopril (PRINIVIL,ZESTRIL) 40 MG tablet Take 40 mg by mouth at bedtime.     Marland Kitchen. LORazepam (ATIVAN) 1 MG tablet Take 1 tablet (1 mg total) by mouth every 4 (four) hours as needed for anxiety or sleep. 30 tablet 0  . metFORMIN (GLUCOPHAGE) 1000 MG tablet Take 1,000 mg by mouth 2 (two) times daily with a meal.    . Multiple Vitamins-Minerals (MULTIVITAMIN & MINERAL PO) Take 1 tablet by mouth daily.    . Vitamin D, Ergocalciferol, (DRISDOL) 50000 UNITS CAPS capsule Take 50,000 Units by mouth every 30 (thirty) days.    Marland Kitchen. warfarin (COUMADIN) 5 MG tablet Take 6 mg by mouth daily at 6 PM.      No current facility-administered medications for this visit.      Past Medical History:  Diagnosis Date  . AF (atrial fibrillation) (HCC)   . Ankle fracture, right 1972  . Cellulitis of foot, right   . Diabetic retinopathy   . HTN (hypertension)   . Hyperlipidemia   . Low back pain   . Morbid obesity (HCC)   . Osteoporosis   . Type 2 diabetes mellitus (HCC) 1972  . Vitamin B12 deficiency     ROS:   All systems reviewed and negative except as noted in the HPI.   Past Surgical History:  Procedure Laterality Date  .  CHOLECYSTECTOMY  1985  . PACEMAKER INSERTION  around 2010 per pt daughter   [employer]     Family History  Problem Relation Age of Onset  . Cancer Mother   . Heart attack Father   . Cancer Brother      Social History   Social History  . Marital status: Married    Spouse name: N/A  . Number of children: 3  . Years of education: 12   Occupational History  . Retired    Social History Main Topics  . Smoking status: Never Smoker  . Smokeless tobacco: Never Used  . Alcohol use No  . Drug use: No  . Sexual activity: Not on file   Other Topics Concern  . Not on file   Social History Narrative   Granddaughter lives with her and her daughter   Caffeine use: Drinks 1 soda/week   Drinks water mostly    Coffee daily     BP 128/76   Pulse 67   Ht 5'  4" (1.626 m)   Wt 200 lb 3.2 oz (90.8 kg)   BMI 34.36 kg/m   Physical Exam:  Chronically ill but stable appearing 82 year old woman,NAD HEENT: Unremarkable Neck:  7 cm JVD, no thyromegally Lungs:  Clear with no wheezes, rales, or rhonchi. HEART:  Regular rate rhythm, no murmurs, no rubs, no clicks, split S2 Abd:  soft, positive bowel sounds, no organomegally, no rebound, no guarding Ext:  2 plus pulses, trace peripheral edema, no cyanosis, no clubbing Skin:  No rashes no nodules Neuro:  CN II through XII intact, motor grossly intact   DEVICE  Normal device function.  See PaceArt for details.   Assess/Plan: 1. CHB - she is asymptomatic, s/p PPM insertion 2. PAF - she is in NSR 97%. No change in meds. 3. PPM - Her medtronic device is working normally. Will recheck in several months. 4. HTN heart disease - she is well controlled. She will continue her current meds.  Leonia Reeves.D.

## 2016-12-11 ENCOUNTER — Encounter (HOSPITAL_COMMUNITY): Payer: Self-pay | Admitting: *Deleted

## 2016-12-11 ENCOUNTER — Emergency Department (HOSPITAL_COMMUNITY): Payer: Medicare Other

## 2016-12-11 ENCOUNTER — Observation Stay (HOSPITAL_COMMUNITY)
Admission: EM | Admit: 2016-12-11 | Discharge: 2016-12-13 | Disposition: A | Discharge status: Home / Self Care | Payer: Medicare Other | Attending: Family Medicine | Admitting: Family Medicine

## 2016-12-11 DIAGNOSIS — E782 Mixed hyperlipidemia: Secondary | ICD-10-CM | POA: Insufficient documentation

## 2016-12-11 DIAGNOSIS — G20 Parkinson's disease: Secondary | ICD-10-CM | POA: Insufficient documentation

## 2016-12-11 DIAGNOSIS — I480 Paroxysmal atrial fibrillation: Secondary | ICD-10-CM | POA: Diagnosis not present

## 2016-12-11 DIAGNOSIS — N183 Chronic kidney disease, stage 3 (moderate): Secondary | ICD-10-CM | POA: Insufficient documentation

## 2016-12-11 DIAGNOSIS — I130 Hypertensive heart and chronic kidney disease with heart failure and stage 1 through stage 4 chronic kidney disease, or unspecified chronic kidney disease: Secondary | ICD-10-CM | POA: Diagnosis not present

## 2016-12-11 DIAGNOSIS — Z880 Allergy status to penicillin: Secondary | ICD-10-CM | POA: Diagnosis not present

## 2016-12-11 DIAGNOSIS — Z79899 Other long term (current) drug therapy: Secondary | ICD-10-CM | POA: Diagnosis not present

## 2016-12-11 DIAGNOSIS — L03115 Cellulitis of right lower limb: Secondary | ICD-10-CM | POA: Diagnosis not present

## 2016-12-11 DIAGNOSIS — Z6835 Body mass index (BMI) 35.0-35.9, adult: Secondary | ICD-10-CM | POA: Diagnosis not present

## 2016-12-11 DIAGNOSIS — I5032 Chronic diastolic (congestive) heart failure: Secondary | ICD-10-CM | POA: Diagnosis present

## 2016-12-11 DIAGNOSIS — Z87440 Personal history of urinary (tract) infections: Secondary | ICD-10-CM | POA: Diagnosis present

## 2016-12-11 DIAGNOSIS — Z66 Do not resuscitate: Secondary | ICD-10-CM | POA: Diagnosis not present

## 2016-12-11 DIAGNOSIS — Z950 Presence of cardiac pacemaker: Secondary | ICD-10-CM | POA: Diagnosis not present

## 2016-12-11 DIAGNOSIS — E1122 Type 2 diabetes mellitus with diabetic chronic kidney disease: Secondary | ICD-10-CM | POA: Insufficient documentation

## 2016-12-11 DIAGNOSIS — G20A1 Parkinson's disease without dyskinesia, without mention of fluctuations: Secondary | ICD-10-CM | POA: Diagnosis present

## 2016-12-11 DIAGNOSIS — M159 Polyosteoarthritis, unspecified: Secondary | ICD-10-CM

## 2016-12-11 DIAGNOSIS — Z7901 Long term (current) use of anticoagulants: Secondary | ICD-10-CM | POA: Insufficient documentation

## 2016-12-11 DIAGNOSIS — L0390 Cellulitis, unspecified: Secondary | ICD-10-CM | POA: Diagnosis present

## 2016-12-11 DIAGNOSIS — Z792 Long term (current) use of antibiotics: Secondary | ICD-10-CM | POA: Diagnosis not present

## 2016-12-11 DIAGNOSIS — N189 Chronic kidney disease, unspecified: Secondary | ICD-10-CM | POA: Diagnosis not present

## 2016-12-11 DIAGNOSIS — I872 Venous insufficiency (chronic) (peripheral): Secondary | ICD-10-CM | POA: Insufficient documentation

## 2016-12-11 DIAGNOSIS — E11319 Type 2 diabetes mellitus with unspecified diabetic retinopathy without macular edema: Secondary | ICD-10-CM | POA: Diagnosis not present

## 2016-12-11 DIAGNOSIS — M7989 Other specified soft tissue disorders: Secondary | ICD-10-CM | POA: Diagnosis present

## 2016-12-11 DIAGNOSIS — E538 Deficiency of other specified B group vitamins: Secondary | ICD-10-CM | POA: Diagnosis not present

## 2016-12-11 DIAGNOSIS — I10 Essential (primary) hypertension: Secondary | ICD-10-CM | POA: Diagnosis present

## 2016-12-11 DIAGNOSIS — E0821 Diabetes mellitus due to underlying condition with diabetic nephropathy: Secondary | ICD-10-CM

## 2016-12-11 DIAGNOSIS — Z794 Long term (current) use of insulin: Secondary | ICD-10-CM | POA: Diagnosis not present

## 2016-12-11 DIAGNOSIS — I4891 Unspecified atrial fibrillation: Secondary | ICD-10-CM | POA: Diagnosis not present

## 2016-12-11 LAB — BASIC METABOLIC PANEL
Anion gap: 13 (ref 5–15)
BUN: 28 mg/dL — ABNORMAL HIGH (ref 6–20)
CO2: 25 mmol/L (ref 22–32)
Calcium: 9.1 mg/dL (ref 8.9–10.3)
Chloride: 101 mmol/L (ref 101–111)
Creatinine, Ser: 1.36 mg/dL — ABNORMAL HIGH (ref 0.44–1.00)
GFR calc Af Amer: 41 mL/min — ABNORMAL LOW (ref >60)
GFR calc non Af Amer: 36 mL/min — ABNORMAL LOW (ref >60)
Glucose, Bld: 307 mg/dL — ABNORMAL HIGH (ref 65–99)
POTASSIUM: 3.9 mmol/L (ref 3.5–5.1)
SODIUM: 139 mmol/L (ref 135–145)

## 2016-12-11 LAB — CBC WITH DIFFERENTIAL/PLATELET
BASOS PCT: 1 %
Basophils Absolute: 0.1 10*3/uL (ref 0.0–0.1)
EOS ABS: 0.3 10*3/uL (ref 0.0–0.7)
Eosinophils Relative: 2 %
HCT: 36.8 % (ref 36.0–46.0)
HEMOGLOBIN: 11.2 g/dL — AB (ref 12.0–15.0)
Lymphocytes Relative: 20 %
Lymphs Abs: 2.6 10*3/uL (ref 0.7–4.0)
MCH: 25.3 pg — ABNORMAL LOW (ref 26.0–34.0)
MCHC: 30.4 g/dL (ref 30.0–36.0)
MCV: 83.3 fL (ref 78.0–100.0)
Monocytes Absolute: 0.9 10*3/uL (ref 0.1–1.0)
Monocytes Relative: 7 %
NEUTROS PCT: 70 %
Neutro Abs: 9.4 10*3/uL — ABNORMAL HIGH (ref 1.7–7.7)
Platelets: 313 10*3/uL (ref 150–400)
RBC: 4.42 MIL/uL (ref 3.87–5.11)
RDW: 16.2 % — ABNORMAL HIGH (ref 11.5–15.5)
WBC: 13.3 10*3/uL — AB (ref 4.0–10.5)

## 2016-12-11 LAB — I-STAT CG4 LACTIC ACID, ED
Lactic Acid, Venous: 3.1 mmol/L (ref 0.5–1.9)
Lactic Acid, Venous: 1.93 mmol/L (ref 0.5–1.9)

## 2016-12-11 LAB — CBG MONITORING, ED: GLUCOSE-CAPILLARY: 135 mg/dL — AB (ref 65–99)

## 2016-12-11 LAB — PROTIME-INR
INR: 1.59
PROTHROMBIN TIME: 19.2 s — AB (ref 11.4–15.2)

## 2016-12-11 MED ORDER — ATENOLOL 25 MG PO TABS
25.0000 mg | ORAL_TABLET | Freq: Every day | ORAL | Status: DC
  Administered 2016-12-11 – 2016-12-12 (×2): 25 mg via ORAL
  Filled 2016-12-11 (×2): qty 1

## 2016-12-11 MED ORDER — VITAMIN D 1000 UNITS PO TABS
2000.0000 [IU] | ORAL_TABLET | Freq: Every day | ORAL | Status: DC
  Administered 2016-12-12 – 2016-12-13 (×2): 2000 [IU] via ORAL
  Filled 2016-12-11 (×2): qty 2

## 2016-12-11 MED ORDER — ATENOLOL 25 MG PO TABS: 25.0000 mg | ORAL_TABLET | ORAL | Status: DC

## 2016-12-11 MED ORDER — ONDANSETRON HCL 4 MG PO TABS: 4.0000 mg | ORAL_TABLET | Freq: Four times a day (QID) | ORAL | Status: DC | PRN

## 2016-12-11 MED ORDER — VANCOMYCIN HCL 10 G IV SOLR: 1250.0000 mg | INTRAVENOUS | Status: DC

## 2016-12-11 MED ORDER — OMEGA-3 FATTY ACIDS 1000 MG PO CAPS: 2.0000 g | ORAL_CAPSULE | Freq: Every day | ORAL | Status: DC

## 2016-12-11 MED ORDER — OMEGA-3-ACID ETHYL ESTERS 1 G PO CAPS
2.0000 g | ORAL_CAPSULE | Freq: Every day | ORAL | Status: DC
  Administered 2016-12-11 – 2016-12-12 (×2): 2 g via ORAL
  Filled 2016-12-11 (×2): qty 2

## 2016-12-11 MED ORDER — VANCOMYCIN HCL 10 G IV SOLR
1750.0000 mg | Freq: Once | INTRAVENOUS | Status: DC
  Filled 2016-12-11: qty 1750

## 2016-12-11 MED ORDER — INSULIN ASPART 100 UNIT/ML ~~LOC~~ SOLN
0.0000 [IU] | Freq: Three times a day (TID) | SUBCUTANEOUS | Status: DC
  Administered 2016-12-12 – 2016-12-13 (×3): 4 [IU] via SUBCUTANEOUS

## 2016-12-11 MED ORDER — ATORVASTATIN CALCIUM 20 MG PO TABS
20.0000 mg | ORAL_TABLET | Freq: Every day | ORAL | Status: DC
  Administered 2016-12-12: 20 mg via ORAL
  Filled 2016-12-11: qty 1

## 2016-12-11 MED ORDER — ACETAMINOPHEN 650 MG RE SUPP: 650.0000 mg | Freq: Four times a day (QID) | RECTAL | Status: DC | PRN

## 2016-12-11 MED ORDER — INSULIN ASPART 100 UNIT/ML ~~LOC~~ SOLN: 7.0000 [IU] | Freq: Once | SUBCUTANEOUS | Status: DC

## 2016-12-11 MED ORDER — CARBIDOPA-LEVODOPA 25-100 MG PO TABS
2.0000 | ORAL_TABLET | Freq: Three times a day (TID) | ORAL | Status: DC
  Administered 2016-12-11 – 2016-12-13 (×5): 2 via ORAL
  Filled 2016-12-11 (×5): qty 2

## 2016-12-11 MED ORDER — ONDANSETRON HCL 4 MG/2ML IJ SOLN: 4.0000 mg | Freq: Four times a day (QID) | INTRAMUSCULAR | Status: DC | PRN

## 2016-12-11 MED ORDER — INSULIN ASPART 100 UNIT/ML ~~LOC~~ SOLN: 0.0000 [IU] | Freq: Every day | SUBCUTANEOUS | Status: DC

## 2016-12-11 MED ORDER — PROSIGHT PO TABS
1.0000 | ORAL_TABLET | Freq: Every day | ORAL | Status: DC
  Administered 2016-12-12 – 2016-12-13 (×2): 1 via ORAL
  Filled 2016-12-11 (×2): qty 1

## 2016-12-11 MED ORDER — DIGOXIN 125 MCG PO TABS
0.1250 mg | ORAL_TABLET | Freq: Every evening | ORAL | Status: DC
  Administered 2016-12-12 – 2016-12-13 (×2): 0.125 mg via ORAL
  Filled 2016-12-11 (×3): qty 1

## 2016-12-11 MED ORDER — VITAMIN B-12 1000 MCG PO TABS
2000.0000 ug | ORAL_TABLET | Freq: Every day | ORAL | Status: DC
  Administered 2016-12-12 – 2016-12-13 (×2): 2000 ug via ORAL
  Filled 2016-12-11 (×2): qty 2

## 2016-12-11 MED ORDER — DEXTROSE 5 % IV SOLN
1.0000 g | INTRAVENOUS | Status: DC
  Administered 2016-12-11 – 2016-12-12 (×2): 1 g via INTRAVENOUS
  Filled 2016-12-11 (×3): qty 10

## 2016-12-11 MED ORDER — INSULIN GLARGINE 100 UNIT/ML ~~LOC~~ SOLN
12.0000 [IU] | Freq: Two times a day (BID) | SUBCUTANEOUS | Status: DC
  Administered 2016-12-12 (×2): 12 [IU] via SUBCUTANEOUS
  Administered 2016-12-13: 6 [IU] via SUBCUTANEOUS
  Filled 2016-12-11 (×5): qty 0.12

## 2016-12-11 MED ORDER — WARFARIN SODIUM 6 MG PO TABS
6.0000 mg | ORAL_TABLET | Freq: Once | ORAL | Status: AC
  Administered 2016-12-11: 6 mg via ORAL
  Filled 2016-12-11: qty 1

## 2016-12-11 MED ORDER — WARFARIN - PHARMACIST DOSING INPATIENT: Freq: Every day | Status: DC

## 2016-12-11 MED ORDER — POLYETHYLENE GLYCOL 3350 17 G PO PACK: 17.0000 g | PACK | Freq: Every day | ORAL | Status: DC | PRN

## 2016-12-11 MED ORDER — ATENOLOL 50 MG PO TABS
50.0000 mg | ORAL_TABLET | Freq: Every day | ORAL | Status: DC
  Administered 2016-12-12 – 2016-12-13 (×2): 50 mg via ORAL
  Filled 2016-12-11 (×2): qty 1

## 2016-12-11 MED ORDER — HYDROCODONE-ACETAMINOPHEN 5-325 MG PO TABS
1.0000 | ORAL_TABLET | Freq: Two times a day (BID) | ORAL | Status: DC | PRN
  Administered 2016-12-11: 1 via ORAL
  Filled 2016-12-11: qty 1

## 2016-12-11 MED ORDER — ACETAMINOPHEN 325 MG PO TABS: 650.0000 mg | ORAL_TABLET | Freq: Four times a day (QID) | ORAL | Status: DC | PRN

## 2016-12-11 NOTE — ED Provider Notes (Signed)
[facility]-EMERGENCY DEPT Provider Note   CSN: [id number] Arrival date & time: 12/11/16  1625     History   Chief Complaint Chief Complaint  Patient presents with  . Leg Swelling    HPI Leah [name] is a 82 y.o. female.  HPI Patient presents to the emergency room with complaints of primarily right sided leg pain and a "flushing sensation"  patient states earlier this week she developed pain primarily on the right hip area radiating down all the way towards her lower leg.  She has noticed some slight increased in swelling as well though she does have chronic swelling of both limbs. She does have chronic erythema of both of her lower legs associated with vasculitis and venous stasis.  Patient thinks that that redness is about baseline for her. She has had increase in pain in her legs. Initially it was not significant when she was standing but that has progressed. It hurts for her to walk and bear weight. She denies any fevers. No chest pain no shortness of breath. Past Medical History:  Diagnosis Date  . AF (atrial fibrillation) (HCC)   . Ankle fracture, right 1972  . Cellulitis of foot, right   . Diabetic retinopathy   . HTN (hypertension)   . Hyperlipidemia   . Low back pain   . Morbid obesity (HCC)   . Osteoporosis   . Type 2 diabetes mellitus (HCC) 1972  . Vitamin B12 deficiency     Patient Active Problem List   Diagnosis Date Noted  . Acute respiratory failure with hypoxia (HCC) 12/10/2014  . Acute on chronic diastolic heart failure (HCC) 12/10/2014  . Parkinson disease (HCC) 12/10/2014  . Palliative care encounter 12/01/2014  . Dyspnea 12/01/2014  . Weakness generalized 12/01/2014  . DNR (do not resuscitate) 12/01/2014  . CAP (community acquired pneumonia)   . Severe sepsis (HCC) 11/29/2014  . PNA (pneumonia) 11/29/2014  . Cellulitis of right leg 11/29/2014  . DM (diabetes mellitus), type 2 (HCC) 11/29/2014  . ATRIAL FLUTTER 09/01/2010  . Chronic diastolic heart  failure (HCC) 40/98/1191  . PPM-[employer] 09/01/2010  . DM 08/31/2010  . HYPERLIPIDEMIA-MIXED 08/31/2010  . HYPERTENSION, BENIGN 08/31/2010  . Atrial fibrillation (HCC) 08/31/2010  . Chronic kidney disease 08/31/2010    Past Surgical History:  Procedure Laterality Date  . CHOLECYSTECTOMY  1985  . PACEMAKER INSERTION  around 2010 per pt daughter   [employer]    OB History    No data available       Home Medications    Prior to Admission medications   Medication Sig Start Date End Date Taking? Authorizing Provider  atenolol (TENORMIN) 25 MG tablet Take 25-50 mg by mouth See admin instructions. Give 50 mg in the morning and 25 mg in the evening   Yes Historical Provider, MD  atorvastatin (LIPITOR) 20 MG tablet Take 20 mg by mouth daily at 6 PM.    Yes Historical Provider, MD  carbidopa-levodopa (SINEMET IR) 25-100 MG tablet Take 2 tablets by mouth 3 (three) times daily. 06/21/16  Yes Suanne Marker, MD  cephALEXin (KEFLEX) 250 MG capsule Take 250 mg by mouth daily. 09/08/16  Yes Historical Provider, MD  cholecalciferol (VITAMIN D) 1000 units tablet Take 2,000 Units by mouth daily.   Yes Historical Provider, MD  cyanocobalamin 2000 MCG tablet Take 2,000 mcg by mouth daily.   Yes Historical Provider, MD  digoxin (LANOXIN) 0.125 MG tablet Take 0.125 mg by mouth every evening.    Yes Historical  Provider, MD  fish oil-omega-3 fatty acids 1000 MG capsule Take 2 g by mouth at bedtime.    Yes Historical Provider, MD  glimepiride (AMARYL) 4 MG tablet Take 1 mg by mouth daily before breakfast.    Yes Historical Provider, MD  hydrochlorothiazide (HYDRODIURIL) 25 MG tablet Take 25 mg by mouth daily. 08/02/16  Yes Historical Provider, MD  HYDROcodone-acetaminophen (NORCO/VICODIN) 5-325 MG tablet Take 1 tablet by mouth 2 (two) times daily as needed for moderate pain.   Yes Historical Provider, MD  insulin glargine (LANTUS) 100 UNIT/ML injection Inject 12 Units into the skin 2 (two) times  daily.    Yes Historical Provider, MD  L-Methylfolate-B6-B12 (FOLTANX PO) Take 1 tablet by mouth every evening.    Yes Historical Provider, MD  lisinopril (PRINIVIL,ZESTRIL) 40 MG tablet Take 40 mg by mouth at bedtime.    Yes Historical Provider, MD  metFORMIN (GLUCOPHAGE) 1000 MG tablet Take 1,000 mg by mouth 2 (two) times daily with a meal.   Yes Historical Provider, MD  Multiple Vitamins-Minerals (MULTIVITAMIN & MINERAL PO) Take 1 tablet by mouth daily.   Yes Historical Provider, MD  Multiple Vitamins-Minerals (PRESERVISION AREDS) TABS Take 1 tablet by mouth daily.   Yes Historical Provider, MD  warfarin (COUMADIN) 5 MG tablet Take 2.5-5 mg by mouth daily at 6 PM. Takes 2.5mg  on sat only Takes  all other days   Yes Historical Provider, MD    Family History Family History  Problem Relation Age of Onset  . Cancer Mother   . Heart attack Father   . Cancer Brother     Social History Social History  Substance Use Topics  . Smoking status: Never Smoker  . Smokeless tobacco: Never Used  . Alcohol use No     Allergies   Penicillins and Diphenoxylate-atropine   Review of Systems Review of Systems  All other systems reviewed and are negative.    Physical Exam Updated Vital Signs BP (!) 162/51   Pulse 61   Temp 98.3 F (36.8 C) (Oral)   Resp 12   Ht  (1.626 m)   Wt 89.8 kg   SpO2 100%   BMI 33.99 kg/m   Physical Exam  Constitutional: She appears well-developed and well-nourished. No distress.  HENT:  Head: Normocephalic and atraumatic.  Right Ear: External ear normal.  Left Ear: External ear normal.  Eyes: Conjunctivae are normal. Right eye exhibits no discharge. Left eye exhibits no discharge. No scleral icterus.  Neck: Neck supple. No tracheal deviation present.  Cardiovascular: Normal rate, regular rhythm and intact distal pulses.   Pulmonary/Chest: Effort normal and breath sounds normal. No stridor. No respiratory distress. She has no wheezes. She has no  rales.  Abdominal: Soft. Bowel sounds are normal. She exhibits no distension. There is no tenderness. There is no rebound and no guarding.  Musculoskeletal: She exhibits edema and tenderness.  Pitting edema bilateral lower extremities from the thighs down to the lower legs, chronic scaling erythema below the knees, tenderness to palpation of the thighs and knees casts bilaterally with the right greater than the left  Neurological: She is alert. She has normal strength. No cranial nerve deficit (no facial droop, extraocular movements intact, no slurred speech) or sensory deficit. She exhibits normal muscle tone. She displays no seizure activity. Coordination normal.  Skin: Skin is warm and dry. No rash noted.  Psychiatric: She has a normal mood and affect.  Nursing note and vitals reviewed.    ED Treatments /  Results  Labs (all labs ordered are listed, but only abnormal results are displayed) Labs Reviewed  CBC WITH DIFFERENTIAL/PLATELET - Abnormal; Notable for the following:       Result Value   WBC 13.3 (*)    Hemoglobin 11.2 (*)    MCH 25.3 (*)    RDW 16.2 (*)    Neutro Abs 9.4 (*)    All other components within normal limits  BASIC METABOLIC PANEL - Abnormal; Notable for the following:    Glucose, Bld 307 (*)    BUN 28 (*)    Creatinine, Ser 1.36 (*)    GFR calc non Af Amer 36 (*)    GFR calc Af Amer 41 (*)    All other components within normal limits  I-STAT CG4 LACTIC ACID, ED - Abnormal; Notable for the following:    Lactic Acid, Venous 3.10 (*)    All other components within normal limits  CULTURE, BLOOD (ROUTINE X 2)  CULTURE, BLOOD (ROUTINE X 2)     Radiology Dg Chest 2 View  Result Date: 12/11/2016 CLINICAL DATA:  Lower extremity hip and knee pain with swelling. Atelectasis. Aortic atherosclerosis. EXAM: CHEST  2 VIEW COMPARISON:  12/04/2014 CXR FINDINGS: Stable cardiomegaly with aortic atherosclerosis. No aneurysm. Atelectasis and/or scarring at the left lung base  and region of the lingula. No overt pulmonary edema. No effusion or pneumothorax. Right-sided pacemaker apparatus is noted with right atrial and right ventricular leads in place. No acute nor suspicious osseous abnormality. IMPRESSION: Stable cardiomegaly with aortic atherosclerosis. Left basilar atelectasis and/or scarring. No overt pulmonary edema or pneumonic consolidation. Electronically Signed   By: Tollie Eth M.D.   On: 12/11/2016 19:17   Dg Knee 2 Views Left  Result Date: 12/11/2016 CLINICAL DATA:  Left knee pain and swelling EXAM: LEFT KNEE - 1-2 VIEW COMPARISON:  None. FINDINGS: Tricompartmental osteoarthritis of the left knee with moderate to marked femorotibial joint space narrowing and spurring. Spurring is also noted off the patella with slight joint space narrowing of the patellofemoral compartment. No joint effusion, acute fracture or malalignment. IMPRESSION: Tricompartmental osteoarthritis more severely affecting the femorotibial compartment. No acute osseous abnormality nor joint effusion. Electronically Signed   By: Tollie Eth M.D.   On: 12/11/2016 19:22   Dg Knee 2 Views Right  Result Date: 12/11/2016 CLINICAL DATA:  Right knee pain and swelling EXAM: RIGHT KNEE - 1-2 VIEW COMPARISON:  None. FINDINGS: Bone-on-bone apposition of the lateral femorotibial compartment with flattening of the weight-bearing portion of the femoral condyle consistent with advanced osteoarthritis. Spurring off both femoral condyles mid tibial plateau, lateral greater than medial. Osteoarthritis of the patellofemoral compartment with joint space narrowing and spurring is also noted. No significant joint effusion. Degenerate change of the fibular head. There is mild periarticular soft tissue edema. Popliteal and branch vessel arteriosclerosis. IMPRESSION: Tricompartmental osteoarthritis with bone-on-bone apposition of the lateral femorotibial compartment. No acute fracture, joint effusion nor dislocations.  Electronically Signed   By: Tollie Eth M.D.   On: 12/11/2016 19:25   Dg Hips Bilat W Or Wo Pelvis 2 Views  Result Date: 12/11/2016 CLINICAL DATA:  Leg swelling, hip and knee pain bilaterally EXAM: DG HIP (WITH OR WITHOUT PELVIS) 2V BILAT COMPARISON:  02/02/2004 right femur radiographs FINDINGS: Lower lumbar degenerative disc disease L4-5. Bony pelvis appears intact. Slight degenerative joint space narrowing of hip joints with minimal spurring off the acetabular roofs. Femoral heads are maintained without flattening. No acute fractures identified. Atherosclerotic calcifications are identified bilaterally  from aortic bifurcation through common femoral arteries and their branches. IMPRESSION: Lower lumbar degenerative disc disease L4-5. No acute osseous appearing abnormality of the pelvis. Degenerative joint space narrowing of both hips. Electronically Signed   By: Tollie Eth M.D.   On: 12/11/2016 19:21    Procedures Procedures (including critical care time)  Medications Ordered in ED Medications  vancomycin (VANCOCIN) 1,750 mg in sodium chloride 0.9 % 500 mL IVPB (not administered)     Initial Impression / Assessment and Plan / ED Course  I have reviewed the triage vital signs and the nursing notes.  Pertinent labs & imaging results that were available during my care of the patient were reviewed by me and considered in my medical decision making (see chart for details).  Patient presents to the emergency room with complaints of increasing leg pain and swelling. Patient does have chronic lymphedema and erythema however seems like her symptoms have gotten worse recently. X-rays show evidence of osteoarthritis but otherwise no acute bony abnormalities. Laboratory tests show an elevated white blood cell count and a lactic acid level.  She remains normotensive and not tachycardic. I doubt severe sepsis. I have ordered blood cultures. I will consult the medical service for admission and further  treatment.  Final Clinical Impressions(s) / ED Diagnoses   Final diagnoses:  Cellulitis of right lower extremity  Osteoarthritis of multiple joints, unspecified osteoarthritis type    New Prescriptions New Prescriptions   No medications on file     Linwood Dibbles, MD 12/11/16 2026

## 2016-12-11 NOTE — ED Notes (Signed)
Patient transported to X-ray 

## 2016-12-11 NOTE — Progress Notes (Signed)
ANTICOAGULATION CONSULT NOTE - Initial Consult  Pharmacy Consult for warfarin  Indication: atrial fibrillation  Allergies  Allergen Reactions  . Penicillins Hives and Itching  . Diphenoxylate-Atropine Other (See Comments)    Unknown reaction    Patient Measurements: Height:  (162.6 cm) Weight: 198 lb (89.8 kg) IBW/kg (Calculated) : 54.7  Vital Signs: Temp: 98.3 F (36.8 C) (04/01 1758) Temp Source: Oral (04/01 1758) BP: 162/51 (04/01 1800) Pulse Rate: 61 (04/01 1800)  Labs:  Recent Labs  12/11/16 1644 12/11/16 2100  HGB 11.2*  --   HCT 36.8  --   PLT 313  --   LABPROT  --  19.2*  INR  --  1.59  CREATININE 1.36*  --     Estimated Creatinine Clearance: 35.8 mL/min (A) (by C-G formula based on SCr of 1.36 mg/dL (H)).   Medical History: Past Medical History:  Diagnosis Date  . AF (atrial fibrillation) (HCC)   . Ankle fracture, right 1972  . Cellulitis of foot, right   . Diabetic retinopathy   . HTN (hypertension)   . Hyperlipidemia   . Low back pain   . Morbid obesity (HCC)   . Osteoporosis   . Type 2 diabetes mellitus (HCC) 1972  . Vitamin B12 deficiency      Assessment: 82 yoF on warfarin prior to admission for atrial fibrillation. INR on admission 1.59 with last dose taken on 3/31. CBC stable. Prior to admission dose 2.5 mg on Saturday and 5 mg all other days.   Goal of Therapy:  INR 2-3 Monitor platelets by anticoagulation protocol: Yes   Plan:  1. Warfarin 6 mg x 1  2. Daily INR   Pollyann Samples, PharmD, BCPS 12/11/2016, 9:36 PM

## 2016-12-11 NOTE — ED Triage Notes (Signed)
Pt reports swelling in bilateral legs that started on Wednesday. Pt noted to have swelling that extends to her upper legs. Pt reports pain only in her knees when she tries to stand.

## 2016-12-11 NOTE — ED Notes (Signed)
I Stat Lactic Acid results shown to Dr. J. Knapp 

## 2016-12-11 NOTE — H&P (Signed)
History and Physical    Leah [name]:[id number] DOB: 02/18/1935 DOA: 12/11/2016  PCP: Ezequiel Kayser, MD   Patient coming from: Home  Chief Complaint: Right leg pain, swelling, redness  HPI: Leah [name] is a 82 y.o. female with medical history significant for atrial fibrillation on warfarin, chronic kidney disease, chronic venous stasis, chronic diastolic CHF, hypertension, insulin-dependent diabetes mellitus, and Parkinson's disease who presents to the emergency department with pain, swelling, and redness involving the right leg. Patient has chronic lower extremity swelling with stasis dermatitis, but over the past 5 days has noted an increase in pain, swelling, and redness of the right leg. She denies any fevers or chills and denies any dyspnea, chest pain, or cough. She is anticoagulated with warfarin. There is some chronic ulcers, but no draining wounds. She has had blistering of the distal lower extremities previously which was attributed to her venous stasis, but has not had that in the past several days. Patient describes her pain as severe, constant, localized to the right leg, worse with weightbearing or movement, and better with rest and offloading. She has not attempted any interventions for symptoms prior to coming in.  ED Course: Upon arrival to the ED, patient is found to be afebrile, saturating well on room air, and with vitals otherwise stable. Chest x-ray is notable for stable cardiomegaly with left basilar atelectasis versus scarring and no overt edema or consolidation. Chemistry panel is notable for a serum creatinine 1.36 with no recent prior valuable for comparison. CBC features a leukocytosis to 13,300 and lactic acid is elevated to 3.10. Blood cultures were obtained in the emergency department and empiric vancomycin was ordered for suspected cellulitis of the right lower extremity. Patient remained hemodynamically stable and in no apparent respiratory distress in the  ED and will be observed on the medical-surgical unit for ongoing evaluation and management of suspected cellulitis.  Review of Systems:  All other systems reviewed and apart from HPI, are negative.  Past Medical History:  Diagnosis Date  . AF (atrial fibrillation) (HCC)   . Ankle fracture, right 1972  . Cellulitis of foot, right   . Diabetic retinopathy   . HTN (hypertension)   . Hyperlipidemia   . Low back pain   . Morbid obesity (HCC)   . Osteoporosis   . Type 2 diabetes mellitus (HCC) 1972  . Vitamin B12 deficiency     Past Surgical History:  Procedure Laterality Date  . CHOLECYSTECTOMY  1985  . PACEMAKER INSERTION  around 2010 per pt daughter   [employer]     reports that she has never smoked. She has never used smokeless tobacco. She reports that she does not drink alcohol or use drugs.  Allergies  Allergen Reactions  . Penicillins Hives and Itching  . Diphenoxylate-Atropine Other (See Comments)    Unknown reaction    Family History  Problem Relation Age of Onset  . Cancer Mother   . Heart attack Father   . Cancer Brother      Prior to Admission medications   Medication Sig Start Date End Date Taking? Authorizing Provider  atenolol (TENORMIN) 25 MG tablet Take 25-50 mg by mouth See admin instructions. Give 50 mg in the morning and 25 mg in the evening   Yes Historical Provider, MD  atorvastatin (LIPITOR) 20 MG tablet Take 20 mg by mouth daily at 6 PM.    Yes Historical Provider, MD  carbidopa-levodopa (SINEMET IR) 25-100 MG tablet Take 2 tablets by mouth 3 (  three) times daily. 06/21/16  Yes Suanne Marker, MD  cephALEXin (KEFLEX) 250 MG capsule Take 250 mg by mouth daily. 09/08/16  Yes Historical Provider, MD  cholecalciferol (VITAMIN D) 1000 units tablet Take 2,000 Units by mouth daily.   Yes Historical Provider, MD  cyanocobalamin 2000 MCG tablet Take 2,000 mcg by mouth daily.   Yes Historical Provider, MD  digoxin (LANOXIN) 0.125 MG tablet Take 0.125 mg  by mouth every evening.    Yes Historical Provider, MD  fish oil-omega-3 fatty acids 1000 MG capsule Take 2 g by mouth at bedtime.    Yes Historical Provider, MD  glimepiride (AMARYL) 4 MG tablet Take 1 mg by mouth daily before breakfast.    Yes Historical Provider, MD  hydrochlorothiazide (HYDRODIURIL) 25 MG tablet Take 25 mg by mouth daily. 08/02/16  Yes Historical Provider, MD  HYDROcodone-acetaminophen (NORCO/VICODIN) 5-325 MG tablet Take 1 tablet by mouth 2 (two) times daily as needed for moderate pain.   Yes Historical Provider, MD  insulin glargine (LANTUS) 100 UNIT/ML injection Inject 12 Units into the skin 2 (two) times daily.    Yes Historical Provider, MD  L-Methylfolate-B6-B12 (FOLTANX PO) Take 1 tablet by mouth every evening.    Yes Historical Provider, MD  lisinopril (PRINIVIL,ZESTRIL) 40 MG tablet Take 40 mg by mouth at bedtime.    Yes Historical Provider, MD  metFORMIN (GLUCOPHAGE) 1000 MG tablet Take 1,000 mg by mouth 2 (two) times daily with a meal.   Yes Historical Provider, MD  Multiple Vitamins-Minerals (MULTIVITAMIN & MINERAL PO) Take 1 tablet by mouth daily.   Yes Historical Provider, MD  Multiple Vitamins-Minerals (PRESERVISION AREDS) TABS Take 1 tablet by mouth daily.   Yes Historical Provider, MD  warfarin (COUMADIN) 5 MG tablet Take 2.5-5 mg by mouth daily at 6 PM. Takes 2.5mg  on sat only Takes  all other days   Yes Historical Provider, MD    Physical Exam: Vitals:   12/11/16 1641 12/11/16 1758 12/11/16 1800  BP: (!) 173/68 128/66 (!) 162/51  Pulse: (!) 58 60 61  Resp: Temp: 98.9 F (37.2 C) 98.3 F (36.8 C)   TempSrc: Oral Oral   SpO2: 100% 99% 100%  Weight:  89.8 kg (198 lb)   Height:   (1.626 m)       Constitutional: NAD, calm, in apparent discomfort.  Eyes: PERTLA, lids and conjunctivae normal  ENMT: Mucous membranes are moist. Posterior pharynx clear of any exudate or lesions.   Neck: normal, supple, no masses, no  thyromegaly Respiratory: clear to auscultation bilaterally, no wheezing, no crackles. Normal respiratory effort.   Cardiovascular: Rate ~60 and irregular. 2+ bilateral LE edema. No significant JVD. Abdomen: No distension, no tenderness, no masses palpated. Bowel sounds normal.  Musculoskeletal: no clubbing / cyanosis. No joint deformity upper and lower extremities.    Skin: Bilateral LE's with erythema, swelling, and chronic stasis dermatitis. Distal RLE with slightly more swelling than left, and also hot and tender, no drainage. Otherwise warm, dry, well-perfused. Neurologic: CN 2-12 grossly intact. Sensation intact, DTR normal. Strength 5/5 in all 4 limbs.  Psychiatric: Alert and oriented x 3. Normal mood and affect.     Labs on Admission: I have personally reviewed following labs and imaging studies  CBC:  Recent Labs Lab 12/11/16 1644  WBC 13.3*  NEUTROABS 9.4*  HGB 11.2*  HCT 36.8  MCV 83.3  PLT 313   Basic Metabolic Panel:  Recent Labs Lab 12/11/16 1644  NA  139  K 3.9  CL 101  CO2 25  GLUCOSE 307*  BUN 28*  CREATININE 1.36*  CALCIUM 9.1   GFR: Estimated Creatinine Clearance: 35.8 mL/min (A) (by C-G formula based on SCr of 1.36 mg/dL (H)). Liver Function Tests: No results for input(s): AST, ALT, ALKPHOS, BILITOT, PROT, ALBUMIN in the last 168 hours. No results for input(s): LIPASE, AMYLASE in the last 168 hours. No results for input(s): AMMONIA in the last 168 hours. Coagulation Profile: No results for input(s): INR, PROTIME in the last 168 hours. Cardiac Enzymes: No results for input(s): CKTOTAL, CKMB, CKMBINDEX, TROPONINI in the last 168 hours. BNP (last 3 results) No results for input(s): PROBNP in the last 8760 hours. HbA1C: No results for input(s): HGBA1C in the last 72 hours. CBG: No results for input(s): GLUCAP in the last 168 hours. Lipid Profile: No results for input(s): CHOL, HDL, LDLCALC, TRIG, CHOLHDL, LDLDIRECT in the last 72 hours. Thyroid  Function Tests: No results for input(s): TSH, T4TOTAL, FREET4, T3FREE, THYROIDAB in the last 72 hours. Anemia Panel: No results for input(s): VITAMINB12, FOLATE, FERRITIN, TIBC, IRON, RETICCTPCT in the last 72 hours. Urine analysis:    Component Value Date/Time   COLORURINE AMBER (A) 11/29/2014 1404   APPEARANCEUR CLOUDY (A) 11/29/2014 1404   LABSPEC 1.021 11/29/2014 1404   PHURINE 5.0 11/29/2014 1404   GLUCOSEU NEGATIVE 11/29/2014 1404   HGBUR LARGE (A) 11/29/2014 1404   BILIRUBINUR SMALL (A) 11/29/2014 1404   KETONESUR NEGATIVE 11/29/2014 1404   PROTEINUR >300 (A) 11/29/2014 1404   UROBILINOGEN 0.2 11/29/2014 1404   NITRITE NEGATIVE 11/29/2014 1404   LEUKOCYTESUR NEGATIVE 11/29/2014 1404   Sepsis Labs: (procalcitonin:4,lacticidven:4) )No results found for this or any previous visit (from the past 240 hour(s)).   Radiological Exams on Admission: Dg Chest 2 View  Result Date: 12/11/2016 CLINICAL DATA:  Lower extremity hip and knee pain with swelling. Atelectasis. Aortic atherosclerosis. EXAM: CHEST  2 VIEW COMPARISON:  12/04/2014 CXR FINDINGS: Stable cardiomegaly with aortic atherosclerosis. No aneurysm. Atelectasis and/or scarring at the left lung base and region of the lingula. No overt pulmonary edema. No effusion or pneumothorax. Right-sided pacemaker apparatus is noted with right atrial and right ventricular leads in place. No acute nor suspicious osseous abnormality. IMPRESSION: Stable cardiomegaly with aortic atherosclerosis. Left basilar atelectasis and/or scarring. No overt pulmonary edema or pneumonic consolidation. Electronically Signed   By: Tollie Eth M.D.   On: 12/11/2016 19:17   Dg Knee 2 Views Left  Result Date: 12/11/2016 CLINICAL DATA:  Left knee pain and swelling EXAM: LEFT KNEE - 1-2 VIEW COMPARISON:  None. FINDINGS: Tricompartmental osteoarthritis of the left knee with moderate to marked femorotibial joint space narrowing and spurring. Spurring is also  noted off the patella with slight joint space narrowing of the patellofemoral compartment. No joint effusion, acute fracture or malalignment. IMPRESSION: Tricompartmental osteoarthritis more severely affecting the femorotibial compartment. No acute osseous abnormality nor joint effusion. Electronically Signed   By: Tollie Eth M.D.   On: 12/11/2016 19:22   Dg Knee 2 Views Right  Result Date: 12/11/2016 CLINICAL DATA:  Right knee pain and swelling EXAM: RIGHT KNEE - 1-2 VIEW COMPARISON:  None. FINDINGS: Bone-on-bone apposition of the lateral femorotibial compartment with flattening of the weight-bearing portion of the femoral condyle consistent with advanced osteoarthritis. Spurring off both femoral condyles mid tibial plateau, lateral greater than medial. Osteoarthritis of the patellofemoral compartment with joint space narrowing and spurring is also noted. No significant joint effusion. Degenerate change of  the fibular head. There is mild periarticular soft tissue edema. Popliteal and branch vessel arteriosclerosis. IMPRESSION: Tricompartmental osteoarthritis with bone-on-bone apposition of the lateral femorotibial compartment. No acute fracture, joint effusion nor dislocations. Electronically Signed   By: Tollie Eth M.D.   On: 12/11/2016 19:25   Dg Hips Bilat W Or Wo Pelvis 2 Views  Result Date: 12/11/2016 CLINICAL DATA:  Leg swelling, hip and knee pain bilaterally EXAM: DG HIP (WITH OR WITHOUT PELVIS) 2V BILAT COMPARISON:  02/02/2004 right femur radiographs FINDINGS: Lower lumbar degenerative disc disease L4-5. Bony pelvis appears intact. Slight degenerative joint space narrowing of hip joints with minimal spurring off the acetabular roofs. Femoral heads are maintained without flattening. No acute fractures identified. Atherosclerotic calcifications are identified bilaterally from aortic bifurcation through common femoral arteries and their branches. IMPRESSION: Lower lumbar degenerative disc disease L4-5.  No acute osseous appearing abnormality of the pelvis. Degenerative joint space narrowing of both hips. Electronically Signed   By: Tollie Eth M.D.   On: 12/11/2016 19:21    EKG: Not performed, will obtain as appropriate.   Assessment/Plan  1. Cellulitis, right leg  - Pt presents with increased erythema, tenderness, and swelling to the leg, which is found to be warm and tender, but without fluctuance or drainage  - She is afebrile, but with leukocytosis and elevated lactate  - Radiographs are negative for underlying osseous abnormality  - Blood cultures obtained and empiric Rocephin started for moderate non-purulent cellulitis  - Follow cultures and clinical response to therapy   2. Atrial fibrillation   - In rate-controlled a fib on admission   - CHADS-VASc is 46 (age x2, gender, CHF, DM) - Continue warfarin with pharmacy to dose  - Continue digoxin    3. Chronic diastolic CHF  - Pt appears roughly euvolemic on admission  - No echo report on file - Managed at home with beta-blocker, ACE, HCTZ  - Follow daily wts and I/O's, continue atenolol and digoxin, hold ACE and HCTZ given elevated creatinine of uncertain chronicity    4. Parkinson's disease  - Appears to be stable  - Continue Sinemet   5. Insulin-dependent DM  - A1c was 6.7% in 2010  - Managed at home with glimepiride, metformin, and Lantus 12 units BID  - Check CBG with meals and qHS  - Continue Lantus 12 units BID with high-intensity Novolog correctional   6. Chronic kidney disease stage III  - SCr is elevated to 1.36 on admission with no recent priors available  - She appears roughly euvolemic and her diuretic and ACE-i were held on admission - Monitor fluid status, repeat chem panel in am    DVT prophylaxis: warfarin Code Status: DNR Family Communication: Daughter and granddaughter updated at bedside Disposition Plan: Observe on med-surg Consults called: None Admission status: Observation    Briscoe Deutscher,  MD [facility] Pager [phone]  If 7PM-7AM, please contact night-coverage [email or web address] Password [email or web address]  12/11/2016, 9:08 PM

## 2016-12-11 NOTE — Progress Notes (Signed)
Pharmacy Antibiotic Note  Leah [name] is a 82 y.o. female admitted on 12/11/2016 with bilateral leg swelling. SCr 1.36, eCrCl 35-40 ml/min. Starting vancomycin for cellulitis.    Plan: -Vancomycin 1750 mg IV x1 then 1250 mg IV q24h -Monitor renal fx, cultures, VT at baseline    Height:  (162.6 cm) Weight: 198 lb (89.8 kg) IBW/kg (Calculated) : 54.7  Temp (24hrs), Avg:98.6 F (37 C), Min:98.3 F (36.8 C), Max:98.9 F (37.2 C)   Recent Labs Lab 12/11/16 1644 12/11/16 1813  WBC 13.3*  --   CREATININE 1.36*  --   LATICACIDVEN  --  3.10*    Estimated Creatinine Clearance: 35.8 mL/min (A) (by C-G formula based on SCr of 1.36 mg/dL (H)).    Allergies  Allergen Reactions  . Penicillins Hives and Itching  . Diphenoxylate-Atropine Other (See Comments)    Unknown reaction    Antimicrobials this admission: 4/1 vancomycin >  Dose adjustments this admission: N/A   Microbiology results: N/A  Thank you for allowing pharmacy to be a part of this patient's care.  Baldemar Friday 12/11/2016 8:08 PM

## 2016-12-12 DIAGNOSIS — L03115 Cellulitis of right lower limb: Secondary | ICD-10-CM | POA: Diagnosis not present

## 2016-12-12 LAB — BASIC METABOLIC PANEL
Anion gap: 9 (ref 5–15)
BUN: 25 mg/dL — ABNORMAL HIGH (ref 6–20)
CALCIUM: 8.9 mg/dL (ref 8.9–10.3)
CO2: 28 mmol/L (ref 22–32)
CREATININE: 1.16 mg/dL — AB (ref 0.44–1.00)
Chloride: 104 mmol/L (ref 101–111)
GFR calc non Af Amer: 43 mL/min — ABNORMAL LOW (ref >60)
GFR, EST AFRICAN AMERICAN: 50 mL/min — AB (ref >60)
GLUCOSE: 129 mg/dL — AB (ref 65–99)
Potassium: 3.5 mmol/L (ref 3.5–5.1)
Sodium: 141 mmol/L (ref 135–145)

## 2016-12-12 LAB — URINALYSIS, ROUTINE W REFLEX MICROSCOPIC
BILIRUBIN URINE: NEGATIVE
GLUCOSE, UA: NEGATIVE mg/dL
Ketones, ur: NEGATIVE mg/dL
NITRITE: NEGATIVE
PH: 6 (ref 5.0–8.0)
Protein, ur: 100 mg/dL — AB
SPECIFIC GRAVITY, URINE: 1.01 (ref 1.005–1.030)

## 2016-12-12 LAB — PROTIME-INR
INR: 1.7
PROTHROMBIN TIME: 20.2 s — AB (ref 11.4–15.2)

## 2016-12-12 LAB — GLUCOSE, CAPILLARY
GLUCOSE-CAPILLARY: 172 mg/dL — AB (ref 65–99)
GLUCOSE-CAPILLARY: 144 mg/dL — AB (ref 65–99)
Glucose-Capillary: 111 mg/dL — ABNORMAL HIGH (ref 65–99)
Glucose-Capillary: 157 mg/dL — ABNORMAL HIGH (ref 65–99)

## 2016-12-12 LAB — HEMOGLOBIN A1C
Hgb A1c MFr Bld: 7.6 % — ABNORMAL HIGH (ref 4.8–5.6)
Mean Plasma Glucose: 171 mg/dL

## 2016-12-12 LAB — LACTIC ACID, PLASMA: Lactic Acid, Venous: 1.4 mmol/L (ref 0.5–1.9)

## 2016-12-12 MED ORDER — WARFARIN SODIUM 6 MG PO TABS
6.0000 mg | ORAL_TABLET | Freq: Once | ORAL | Status: AC
  Administered 2016-12-12: 6 mg via ORAL
  Filled 2016-12-12: qty 1

## 2016-12-12 NOTE — Progress Notes (Signed)
ANTICOAGULATION CONSULT NOTE   Pharmacy Consult for warfarin  Indication: atrial fibrillation  Allergies  Allergen Reactions  . Penicillins Hives and Itching  . Diphenoxylate-Atropine Other (See Comments)    Unknown reaction    Patient Measurements: Height:  (162.6 cm) Weight: 204 lb 1.6 oz (92.6 kg) IBW/kg (Calculated) : 54.7  Vital Signs: Temp: 98.3 F (36.8 C) (04/02 0548) Temp Source: Oral (04/02 0548) BP: 138/62 (04/02 0548) Pulse Rate: 56 (04/02 0548)  Labs:  Recent Labs  12/11/16 1644 12/11/16 2100 12/12/16 0013  HGB 11.2*  --   --   HCT 36.8  --   --   PLT 313  --   --   LABPROT  --  19.2* 20.2*  INR  --  1.59 1.70  CREATININE 1.36*  --  1.16*    Estimated Creatinine Clearance: 42.7 mL/min (A) (by C-G formula based on SCr of 1.16 mg/dL (H)).  Assessment: 82 yoF on warfarin prior to admission for atrial fibrillation. INR remains subtherapeutic today at 1.7. No new CBC. RN noted hematoma on thigh.  Goal of Therapy:  INR 2-3 Monitor platelets by anticoagulation protocol: Yes   Plan:  Repeat warfarin  PO x 1 tonight - will follow-up with MD regarding hematoma and DC this order if needed Daily INR  Lysle Pearl, PharmD, BCPS Pager # [phone] 12/12/2016 10:15 AM

## 2016-12-12 NOTE — Progress Notes (Signed)
PROGRESS NOTE    Leah [name]  ZOX:[id number] DOB: 19-Oct-1934 DOA: 12/11/2016 PCP: Ezequiel Kayser, MD    Brief Narrative:  82 y.o. female with medical history significant for atrial fibrillation on warfarin, chronic kidney disease, chronic venous stasis, chronic diastolic CHF, hypertension, insulin-dependent diabetes mellitus, and Parkinson's disease who presents to the emergency department with pain, swelling, and redness involving the right leg. Patient has chronic lower extremity swelling with stasis dermatitis, but over the past 5 days has noted an increase in pain, swelling, and redness of the right leg.    Assessment & Plan:   Principal Problem:   Cellulitis of right leg - Continue current antibiotic regimen. Was administered another dose of Rocephin today. - DC next a.m. on oral antibiotics were continued improvement in condition  Active Problems:   HYPERTENSION, BENIGN - Currently on atenolol    Atrial fibrillation (HCC) - Continue beta blocker and warfarin for anticoagulation    Chronic diastolic heart failure (HCC) - Currently compensated, continue current regimen   Chronic kidney disease   DM (diabetes mellitus), type 2 (HCC) - Continue current hypoglycemic regimen   Parkinson disease (HCC) - Stable continue current medical regimen   DVT prophylaxis: Warfarin Code Status: Full Family Communication: d/c patient Disposition Plan: d/c most likely next am with continued improvement in condition   Consultants:   None   Procedures: None   Antimicrobials: Rocephin   Subjective: The patient has no new complaints. No acute issues overnight  Objective: Vitals:   12/11/16 2300 12/12/16 0004 12/12/16 0346 12/12/16 0548  BP: 136/65 (!) 149/56  138/62  Pulse: (!) 59 60  (!) 56  Resp:  16  17  Temp:  98.8 F (37.1 C)  98.3 F (36.8 C)  TempSrc:  Oral  Oral  SpO2: 93% 98%  97%  Weight:  94.1 kg (207 lb 6.4 oz) 92.6 kg (204 lb 1.6 oz)   Height:    (1.626 m)      Intake/Output Summary (Last 24 hours) at 12/12/16 1749 Last data filed at 12/12/16 1300  Gross per 24 hour  Intake              526 ml  Output              450 ml  Net               76 ml   Filed Weights   12/11/16 1758 12/12/16 0004 12/12/16 0346  Weight: 89.8 kg (198 lb) 94.1 kg (207 lb 6.4 oz) 92.6 kg (204 lb 1.6 oz)    Examination:  General exam: Appears calm and comfortable , No acute distressRespiratory system: Clear to auscultation. Respiratory effort normal. Cardiovascular system: S1 & S2 heard, RRR.  Gastrointestinal system: Abdomen is nondistended, soft and nontender. No organomegaly or masses felt. Normal bowel sounds heard. Central nervous system: Alert and oriented. No focal neurological deficits. Extremities: Symmetric  Power of extremities Skin: cellulitis of lower extremities bilaterally at legs  Psychiatry:  Mood & affect appropriate.   Data Reviewed: I have personally reviewed following labs and imaging studies  CBC:  Recent Labs Lab 12/11/16 1644  WBC 13.3*  NEUTROABS 9.4*  HGB 11.2*  HCT 36.8  MCV 83.3  PLT 313   Basic Metabolic Panel:  Recent Labs Lab 12/11/16 1644 12/12/16 0013  NA 139 141  K 3.9 3.5  CL 101 104  CO2 25 28  GLUCOSE 307* 129*  BUN 28* 25*  CREATININE 1.36* 1.16*  CALCIUM 9.1 8.9   GFR: Estimated Creatinine Clearance: 42.7 mL/min (A) (by C-G formula based on SCr of 1.16 mg/dL (H)). Liver Function Tests: No results for input(s): AST, ALT, ALKPHOS, BILITOT, PROT, ALBUMIN in the last 168 hours. No results for input(s): LIPASE, AMYLASE in the last 168 hours. No results for input(s): AMMONIA in the last 168 hours. Coagulation Profile:  Recent Labs Lab 12/11/16 2100 12/12/16 0013  INR 1.59 1.70   Cardiac Enzymes: No results for input(s): CKTOTAL, CKMB, CKMBINDEX, TROPONINI in the last 168 hours. BNP (last 3 results) No results for input(s): PROBNP in the last 8760 hours. HbA1C: No results for  input(s): HGBA1C in the last 72 hours. CBG:  Recent Labs Lab 12/11/16 2213 12/12/16 0747 12/12/16 1258 12/12/16 1729  GLUCAP 135* 111* 172* 157*   Lipid Profile: No results for input(s): CHOL, HDL, LDLCALC, TRIG, CHOLHDL, LDLDIRECT in the last 72 hours. Thyroid Function Tests: No results for input(s): TSH, T4TOTAL, FREET4, T3FREE, THYROIDAB in the last 72 hours. Anemia Panel: No results for input(s): VITAMINB12, FOLATE, FERRITIN, TIBC, IRON, RETICCTPCT in the last 72 hours. Sepsis Labs:  Recent Labs Lab 12/11/16 1813 12/11/16 2043 12/12/16 0013  LATICACIDVEN 3.10* 1.93* 1.4    Recent Results (from the past 240 hour(s))  Blood culture (routine x 2)     Status: None (Preliminary result)   Collection Time: 12/11/16  8:28 PM  Result Value Ref Range Status   Specimen Description BLOOD RIGHT ARM  Final   Special Requests IN PEDIATRIC BOTTLE Blood Culture adequate volume  Final   Culture NO GROWTH < 24 HOURS  Final   Report Status PENDING  Incomplete  Blood culture (routine x 2)     Status: None (Preliminary result)   Collection Time: 12/11/16  9:00 PM  Result Value Ref Range Status   Specimen Description BLOOD LEFT FOREARM  Final   Special Requests IN PEDIATRIC BOTTLE Blood Culture adequate volume  Final   Culture NO GROWTH < 24 HOURS  Final   Report Status PENDING  Incomplete         Radiology Studies: Dg Chest 2 View  Result Date: 12/11/2016 CLINICAL DATA:  Lower extremity hip and knee pain with swelling. Atelectasis. Aortic atherosclerosis. EXAM: CHEST  2 VIEW COMPARISON:  12/04/2014 CXR FINDINGS: Stable cardiomegaly with aortic atherosclerosis. No aneurysm. Atelectasis and/or scarring at the left lung base and region of the lingula. No overt pulmonary edema. No effusion or pneumothorax. Right-sided pacemaker apparatus is noted with right atrial and right ventricular leads in place. No acute nor suspicious osseous abnormality. IMPRESSION: Stable cardiomegaly with aortic  atherosclerosis. Left basilar atelectasis and/or scarring. No overt pulmonary edema or pneumonic consolidation. Electronically Signed   By: Tollie Eth M.D.   On: 12/11/2016 19:17   Dg Knee 2 Views Left  Result Date: 12/11/2016 CLINICAL DATA:  Left knee pain and swelling EXAM: LEFT KNEE - 1-2 VIEW COMPARISON:  None. FINDINGS: Tricompartmental osteoarthritis of the left knee with moderate to marked femorotibial joint space narrowing and spurring. Spurring is also noted off the patella with slight joint space narrowing of the patellofemoral compartment. No joint effusion, acute fracture or malalignment. IMPRESSION: Tricompartmental osteoarthritis more severely affecting the femorotibial compartment. No acute osseous abnormality nor joint effusion. Electronically Signed   By: Tollie Eth M.D.   On: 12/11/2016 19:22   Dg Knee 2 Views Right  Result Date: 12/11/2016 CLINICAL DATA:  Right knee pain and swelling EXAM: RIGHT KNEE - 1-2 VIEW COMPARISON:  None.  FINDINGS: Bone-on-bone apposition of the lateral femorotibial compartment with flattening of the weight-bearing portion of the femoral condyle consistent with advanced osteoarthritis. Spurring off both femoral condyles mid tibial plateau, lateral greater than medial. Osteoarthritis of the patellofemoral compartment with joint space narrowing and spurring is also noted. No significant joint effusion. Degenerate change of the fibular head. There is mild periarticular soft tissue edema. Popliteal and branch vessel arteriosclerosis. IMPRESSION: Tricompartmental osteoarthritis with bone-on-bone apposition of the lateral femorotibial compartment. No acute fracture, joint effusion nor dislocations. Electronically Signed   By: Tollie Eth M.D.   On: 12/11/2016 19:25   Dg Hips Bilat W Or Wo Pelvis 2 Views  Result Date: 12/11/2016 CLINICAL DATA:  Leg swelling, hip and knee pain bilaterally EXAM: DG HIP (WITH OR WITHOUT PELVIS) 2V BILAT COMPARISON:  02/02/2004 right femur  radiographs FINDINGS: Lower lumbar degenerative disc disease L4-5. Bony pelvis appears intact. Slight degenerative joint space narrowing of hip joints with minimal spurring off the acetabular roofs. Femoral heads are maintained without flattening. No acute fractures identified. Atherosclerotic calcifications are identified bilaterally from aortic bifurcation through common femoral arteries and their branches. IMPRESSION: Lower lumbar degenerative disc disease L4-5. No acute osseous appearing abnormality of the pelvis. Degenerative joint space narrowing of both hips. Electronically Signed   By: Tollie Eth M.D.   On: 12/11/2016 19:21    Scheduled Meds: . atenolol  50 mg Oral Daily   And  . atenolol  25 mg Oral QHS  . atorvastatin  20 mg Oral q1800  . carbidopa-levodopa  2 tablet Oral TID  . cefTRIAXone (ROCEPHIN)  IV  1 g Intravenous Q24H  . cholecalciferol  2,000 Units Oral Daily  . digoxin  0.125 mg Oral QPM  . insulin aspart  0-20 Units Subcutaneous TID WC  . insulin aspart  0-5 Units Subcutaneous QHS  . insulin glargine  12 Units Subcutaneous BID  . multivitamin  1 tablet Oral Daily  . omega-3 acid ethyl esters  2 g Oral QHS  . cyanocobalamin  2,000 mcg Oral Daily  . Warfarin - Pharmacist Dosing Inpatient   Does not apply q1800   Continuous Infusions:   LOS: 0 days    Time spent: > 35 minutes  Penny Pia, MD [facility] Pager [phone]  If 7PM-7AM, please contact night-coverage [email or web address] Password [facility] 12/12/2016, 5:49 PM

## 2016-12-12 NOTE — Progress Notes (Signed)
Pt assisted going to bedside commode and I observed hematoma in between thighs and abdominal skin folds redness and wet applied powder and barrier cream, no complain of pain, no s/s of distress noted regarding the bilateral low leg swelling , flaky and redness.

## 2016-12-13 DIAGNOSIS — L03115 Cellulitis of right lower limb: Secondary | ICD-10-CM | POA: Diagnosis not present

## 2016-12-13 LAB — PROTIME-INR
INR: 1.81
Prothrombin Time: 21.2 seconds — ABNORMAL HIGH (ref 11.4–15.2)

## 2016-12-13 LAB — BASIC METABOLIC PANEL
ANION GAP: 8 (ref 5–15)
BUN: 20 mg/dL (ref 6–20)
CALCIUM: 9.1 mg/dL (ref 8.9–10.3)
CHLORIDE: 107 mmol/L (ref 101–111)
CO2: 29 mmol/L (ref 22–32)
CREATININE: 1.04 mg/dL — AB (ref 0.44–1.00)
GFR calc non Af Amer: 49 mL/min — ABNORMAL LOW (ref >60)
GFR, EST AFRICAN AMERICAN: 57 mL/min — AB (ref >60)
Glucose, Bld: 92 mg/dL (ref 65–99)
Potassium: 3.3 mmol/L — ABNORMAL LOW (ref 3.5–5.1)
Sodium: 144 mmol/L (ref 135–145)

## 2016-12-13 LAB — URINE CULTURE

## 2016-12-13 LAB — GLUCOSE, CAPILLARY
GLUCOSE-CAPILLARY: 178 mg/dL — AB (ref 65–99)
Glucose-Capillary: 76 mg/dL (ref 65–99)

## 2016-12-13 LAB — CBC
HEMATOCRIT: 32.1 % — AB (ref 36.0–46.0)
HEMOGLOBIN: 10.3 g/dL — AB (ref 12.0–15.0)
MCH: 26.5 pg (ref 26.0–34.0)
MCHC: 32.1 g/dL (ref 30.0–36.0)
MCV: 82.5 fL (ref 78.0–100.0)
Platelets: 266 10*3/uL (ref 150–400)
RBC: 3.89 MIL/uL (ref 3.87–5.11)
RDW: 16.4 % — AB (ref 11.5–15.5)
WBC: 11.4 10*3/uL — ABNORMAL HIGH (ref 4.0–10.5)

## 2016-12-13 MED ORDER — CEFPODOXIME PROXETIL 200 MG PO TABS: 200.0000 mg | ORAL_TABLET | Freq: Two times a day (BID) | ORAL | 0 refills | Status: AC

## 2016-12-13 MED ORDER — INSULIN GLARGINE 100 UNIT/ML ~~LOC~~ SOLN: 6.0000 [IU] | Freq: Two times a day (BID) | SUBCUTANEOUS | 11 refills | Status: DC

## 2016-12-13 MED ORDER — WARFARIN SODIUM 6 MG PO TABS
6.0000 mg | ORAL_TABLET | Freq: Once | ORAL | Status: DC
  Filled 2016-12-13: qty 1

## 2016-12-13 MED ORDER — CEFPODOXIME PROXETIL 200 MG PO TABS
200.0000 mg | ORAL_TABLET | Freq: Two times a day (BID) | ORAL | Status: DC
  Filled 2016-12-13: qty 1

## 2016-12-13 NOTE — Progress Notes (Signed)
Patient's IV infiltrated. Patient is refusing to have another IV placed.

## 2016-12-13 NOTE — Progress Notes (Signed)
ANTICOAGULATION CONSULT NOTE   Pharmacy Consult for warfarin  Indication: atrial fibrillation  Allergies  Allergen Reactions  . Penicillins Hives and Itching  . Diphenoxylate-Atropine Other (See Comments)    Unknown reaction    Patient Measurements: Height:  (162.6 cm) Weight: 196 lb 6.4 oz (89.1 kg) IBW/kg (Calculated) : 54.7  Vital Signs: Temp: 98.2 F (36.8 C) (04/03 0533) Temp Source: Oral (04/03 0533) BP: 121/77 (04/03 0533) Pulse Rate: 66 (04/03 0533)  Labs:  Recent Labs  12/11/16 1644 12/11/16 2100 12/12/16 0013 12/13/16 0342  HGB 11.2*  --   --  10.3*  HCT 36.8  --   --  32.1*  PLT 313  --   --  266  LABPROT  --  19.2* 20.2* 21.2*  INR  --  1.59 1.70 1.81  CREATININE 1.36*  --  1.16* 1.04*    Estimated Creatinine Clearance: 46.7 mL/min (A) (by C-G formula based on SCr of 1.04 mg/dL (H)).  Assessment: 82 yoF on warfarin prior to admission for atrial fibrillation. INR remains subtherapeutic today at 1.81 but is trending up nicely. H/H slightly low but stable and platelets are WNL. No further mention of bleeding noted.   Goal of Therapy:  INR 2-3 Monitor platelets by anticoagulation protocol: Yes   Plan:  Repeat warfarin  PO x 1 tonight  Daily INR  Lysle Pearl, PharmD, BCPS Pager # [phone] 12/13/2016 9:24 AM

## 2016-12-13 NOTE — Progress Notes (Signed)
RN paged MD to clarify the amount of Lantus insulin to give to the patient. Patient's blood sugar was 76. Ordered dose of Lantus is 12 units. Per MD, give 6 units of Lantus. This RN will administer and monitor.

## 2016-12-13 NOTE — Progress Notes (Signed)
Discharge paperwork given to patient. Reviewed with patient and patient's daughter. Patient verbalized understanding. Patient is ready for discharge.

## 2016-12-13 NOTE — Discharge Summary (Addendum)
Physician Discharge Summary  Leah [name]:[id number] DOB: 07-17-1935 DOA: 12/11/2016  PCP: Ezequiel Kayser, MD  Admit date: 12/11/2016 Discharge date: 12/13/2016  Time spent: > 35 minutes  Recommendations for Outpatient Follow-up:  1. Monitor K levels 2. Will continue Oral third generation cephalosporin for 8 more days to complete a 10 day total treatment regimen 3. Due to low normal blood sugar levels long-acting insulin dose decreased. Please monitor blood glucose and adjust accordingly   Discharge Diagnoses:  Principal Problem:   Cellulitis of right leg Active Problems:   HYPERTENSION, BENIGN   Atrial fibrillation (HCC)   Chronic diastolic heart failure (HCC)   Chronic kidney disease   DM (diabetes mellitus), type 2 (HCC)   Parkinson disease (HCC)   Cellulitis   History of recurrent UTIs   Discharge Condition: Stable  Diet recommendation: Diabetic diet  Filed Weights   12/12/16 0004 12/12/16 0346 12/13/16 0500  Weight: 94.1 kg (207 lb 6.4 oz) 92.6 kg (204 lb 1.6 oz) 89.1 kg (196 lb 6.4 oz)    History of present illness:   82 y.o. female with medical history significant for atrial fibrillation on warfarin, chronic kidney disease, chronic venous stasis, chronic diastolic CHF, hypertension, insulin-dependent diabetes mellitus, and Parkinson's disease who presents to the emergency department with pain, swelling, and redness involving the right leg  Hospital Course:  Principal Problem:   Cellulitis of right leg - Blood cultures currently remain negative - Improved on Rocephin as such will discontinue on a more days of third generation oral cephalosporin  Active Problems:   HYPERTENSION, BENIGN - Currently on atenolol    Atrial fibrillation (HCC) - Continue beta blocker and warfarin for anticoagulation    Chronic diastolic heart failure (HCC) - Currently compensated, continue current regimen    Chronic kidney disease - Stable with last serum creatinine 1.04    DM (diabetes mellitus), type 2 (HCC) - Continue Lantus at half home dose prior to admission    Parkinson disease (HCC) - Stable continue current medical regimen  Procedures:  None  Consultations:  None  Discharge Exam: Vitals:   12/13/16 1100 12/13/16 1400  BP: 122/61 (!) 158/56  Pulse: 69 83  Resp: 16 16  Temp: 98.6 F (37 C) 98.6 F (37 C)    General: Pt in nad, alert and awake Cardiovascular: rrr, no rubs Respiratory: no increased wob, no wheezes  Discharge Instructions   Discharge Instructions    Call MD for:  extreme fatigue    Complete by:  As directed    Call MD for:  severe uncontrolled pain    Complete by:  As directed    Call MD for:  temperature >100.4    Complete by:  As directed    Diet - low sodium heart healthy    Complete by:  As directed    Increase activity slowly    Complete by:  As directed      Current Discharge Medication List    START taking these medications   Details  cefpodoxime (VANTIN) 200 MG tablet Take 1 tablet (200 mg total) by mouth every 12 (twelve) hours. Qty: 16 tablet, Refills: 0      CONTINUE these medications which have CHANGED   Details  insulin glargine (LANTUS) 100 UNIT/ML injection Inject 0.06 mLs (6 Units total) into the skin 2 (two) times daily. Qty: 10 mL, Refills: 11      CONTINUE these medications which have NOT CHANGED   Details  atenolol (TENORMIN) 25 MG tablet  Take 25-50 mg by mouth See admin instructions. Give 50 mg in the morning and 25 mg in the evening   Associated Diagnoses: Atrial fibrillation (HCC)    atorvastatin (LIPITOR) 20 MG tablet Take 20 mg by mouth daily at 6 PM.     carbidopa-levodopa (SINEMET IR) 25-100 MG tablet Take 2 tablets by mouth 3 (three) times daily. Qty: 540 tablet, Refills: 4    cholecalciferol (VITAMIN D) 1000 units tablet Take 2,000 Units by mouth daily.    cyanocobalamin 2000 MCG tablet Take 2,000 mcg by mouth daily.    digoxin (LANOXIN) 0.125 MG tablet Take 0.125  mg by mouth every evening.     fish oil-omega-3 fatty acids 1000 MG capsule Take 2 g by mouth at bedtime.     glimepiride (AMARYL) 4 MG tablet Take 1 mg by mouth daily before breakfast.     hydrochlorothiazide (HYDRODIURIL) 25 MG tablet Take 25 mg by mouth daily.    HYDROcodone-acetaminophen (NORCO/VICODIN) 5-325 MG tablet Take 1 tablet by mouth 2 (two) times daily as needed for moderate pain.    L-Methylfolate-B6-B12 (FOLTANX PO) Take 1 tablet by mouth every evening.     lisinopril (PRINIVIL,ZESTRIL) 40 MG tablet Take 40 mg by mouth at bedtime.    Associated Diagnoses: Chronic diastolic heart failure (HCC)    metFORMIN (GLUCOPHAGE) 1000 MG tablet Take 1,000 mg by mouth 2 (two) times daily with a meal.    Multiple Vitamins-Minerals (MULTIVITAMIN & MINERAL PO) Take 1 tablet by mouth daily.    Multiple Vitamins-Minerals (PRESERVISION AREDS) TABS Take 1 tablet by mouth daily.    warfarin (COUMADIN) 5 MG tablet Take 2.5-5 mg by mouth daily at 6 PM. Takes 2.5mg  on sat only Takes  all other days      STOP taking these medications     cephALEXin (KEFLEX) 250 MG capsule        Allergies  Allergen Reactions  . Penicillins Hives and Itching  . Diphenoxylate-Atropine Other (See Comments)    Unknown reaction      The results of significant diagnostics from this hospitalization (including imaging, microbiology, ancillary and laboratory) are listed below for reference.    Significant Diagnostic Studies: Dg Chest 2 View  Result Date: 12/11/2016 CLINICAL DATA:  Lower extremity hip and knee pain with swelling. Atelectasis. Aortic atherosclerosis. EXAM: CHEST  2 VIEW COMPARISON:  12/04/2014 CXR FINDINGS: Stable cardiomegaly with aortic atherosclerosis. No aneurysm. Atelectasis and/or scarring at the left lung base and region of the lingula. No overt pulmonary edema. No effusion or pneumothorax. Right-sided pacemaker apparatus is noted with right atrial and right ventricular leads in  place. No acute nor suspicious osseous abnormality. IMPRESSION: Stable cardiomegaly with aortic atherosclerosis. Left basilar atelectasis and/or scarring. No overt pulmonary edema or pneumonic consolidation. Electronically Signed   By: Tollie Eth M.D.   On: 12/11/2016 19:17   Dg Knee 2 Views Left  Result Date: 12/11/2016 CLINICAL DATA:  Left knee pain and swelling EXAM: LEFT KNEE - 1-2 VIEW COMPARISON:  None. FINDINGS: Tricompartmental osteoarthritis of the left knee with moderate to marked femorotibial joint space narrowing and spurring. Spurring is also noted off the patella with slight joint space narrowing of the patellofemoral compartment. No joint effusion, acute fracture or malalignment. IMPRESSION: Tricompartmental osteoarthritis more severely affecting the femorotibial compartment. No acute osseous abnormality nor joint effusion. Electronically Signed   By: Tollie Eth M.D.   On: 12/11/2016 19:22   Dg Knee 2 Views Right  Result Date: 12/11/2016 CLINICAL DATA:  Right knee pain and swelling EXAM: RIGHT KNEE - 1-2 VIEW COMPARISON:  None. FINDINGS: Bone-on-bone apposition of the lateral femorotibial compartment with flattening of the weight-bearing portion of the femoral condyle consistent with advanced osteoarthritis. Spurring off both femoral condyles mid tibial plateau, lateral greater than medial. Osteoarthritis of the patellofemoral compartment with joint space narrowing and spurring is also noted. No significant joint effusion. Degenerate change of the fibular head. There is mild periarticular soft tissue edema. Popliteal and branch vessel arteriosclerosis. IMPRESSION: Tricompartmental osteoarthritis with bone-on-bone apposition of the lateral femorotibial compartment. No acute fracture, joint effusion nor dislocations. Electronically Signed   By: Tollie Eth M.D.   On: 12/11/2016 19:25   Dg Hips Bilat W Or Wo Pelvis 2 Views  Result Date: 12/11/2016 CLINICAL DATA:  Leg swelling, hip and knee  pain bilaterally EXAM: DG HIP (WITH OR WITHOUT PELVIS) 2V BILAT COMPARISON:  02/02/2004 right femur radiographs FINDINGS: Lower lumbar degenerative disc disease L4-5. Bony pelvis appears intact. Slight degenerative joint space narrowing of hip joints with minimal spurring off the acetabular roofs. Femoral heads are maintained without flattening. No acute fractures identified. Atherosclerotic calcifications are identified bilaterally from aortic bifurcation through common femoral arteries and their branches. IMPRESSION: Lower lumbar degenerative disc disease L4-5. No acute osseous appearing abnormality of the pelvis. Degenerative joint space narrowing of both hips. Electronically Signed   By: Tollie Eth M.D.   On: 12/11/2016 19:21    Microbiology: Recent Results (from the past 240 hour(s))  Blood culture (routine x 2)     Status: None (Preliminary result)   Collection Time: 12/11/16  8:28 PM  Result Value Ref Range Status   Specimen Description BLOOD RIGHT ARM  Final   Special Requests IN PEDIATRIC BOTTLE Blood Culture adequate volume  Final   Culture NO GROWTH 2 DAYS  Final   Report Status PENDING  Incomplete  Blood culture (routine x 2)     Status: None (Preliminary result)   Collection Time: 12/11/16  9:00 PM  Result Value Ref Range Status   Specimen Description BLOOD LEFT FOREARM  Final   Special Requests IN PEDIATRIC BOTTLE Blood Culture adequate volume  Final   Culture NO GROWTH 2 DAYS  Final   Report Status PENDING  Incomplete  Culture, Urine     Status: Abnormal   Collection Time: 12/12/16  2:31 AM  Result Value Ref Range Status   Specimen Description URINE, RANDOM  Final   Special Requests NONE  Final   Culture MULTIPLE SPECIES PRESENT, SUGGEST RECOLLECTION (A)  Final   Report Status 12/13/2016 FINAL  Final     Labs: Basic Metabolic Panel:  Recent Labs Lab 12/11/16 1644 12/12/16 0013 12/13/16 0342  NA 139 141 144  K 3.9 3.5 3.3*  CL 101 104 107  CO2 GLUCOSE  307* 129* 92  BUN 28* 25* 20  CREATININE 1.36* 1.16* 1.04*  CALCIUM 9.1 8.9 9.1   Liver Function Tests: No results for input(s): AST, ALT, ALKPHOS, BILITOT, PROT, ALBUMIN in the last 168 hours. No results for input(s): LIPASE, AMYLASE in the last 168 hours. No results for input(s): AMMONIA in the last 168 hours. CBC:  Recent Labs Lab 12/11/16 1644 12/13/16 0342  WBC 13.3* 11.4*  NEUTROABS 9.4*  --   HGB 11.2* 10.3*  HCT 36.8 32.1*  MCV 83.3 82.5  PLT 313 266   Cardiac Enzymes: No results for input(s): CKTOTAL, CKMB, CKMBINDEX, TROPONINI in the last 168 hours. BNP: BNP (last 3  results) No results for input(s): BNP in the last 8760 hours.  ProBNP (last 3 results) No results for input(s): PROBNP in the last 8760 hours.  CBG:  Recent Labs Lab 12/12/16 1258 12/12/16 1729 12/12/16 2138 12/13/16 0738 12/13/16 1239  GLUCAP 172* 157* 144* 76 178*    Signed:  Penny Pia MD.  [facility] 12/13/2016, 4:02 PM

## 2016-12-16 LAB — CULTURE, BLOOD (ROUTINE X 2)
CULTURE: NO GROWTH
CULTURE: NO GROWTH
SPECIAL REQUESTS: ADEQUATE
SPECIAL REQUESTS: ADEQUATE

## 2016-12-20 ENCOUNTER — Ambulatory Visit (INDEPENDENT_AMBULATORY_CARE_PROVIDER_SITE_OTHER): Payer: Medicare Other | Admitting: Diagnostic Neuroimaging

## 2016-12-20 ENCOUNTER — Encounter: Payer: Self-pay | Admitting: Diagnostic Neuroimaging

## 2016-12-20 VITALS — BP 165/69 | HR 59 | Ht 64.0 in | Wt 200.0 lb

## 2016-12-20 DIAGNOSIS — G20 Parkinson's disease: Secondary | ICD-10-CM

## 2016-12-20 DIAGNOSIS — R269 Unspecified abnormalities of gait and mobility: Secondary | ICD-10-CM

## 2016-12-20 MED ORDER — CARBIDOPA-LEVODOPA 25-100 MG PO TABS: 2.0000 | ORAL_TABLET | Freq: Three times a day (TID) | ORAL | 4 refills | Status: DC

## 2016-12-20 NOTE — Progress Notes (Signed)
[facility]  PATIENT: Leah [name] DOB: 1935/06/25  REFERRING CLINICIAN: M Perini HISTORY FROM: patient and daughter  REASON FOR VISIT: follow up   HISTORICAL  CHIEF COMPLAINT:  Chief Complaint  Patient presents with  . Parkinson's Disease    doing well, with sinemet 2 tabs po tid.  Has had some hallucinations which scared her (thought people in home).      HISTORY OF PRESENT ILLNESS:   UPDATE 12/20/16: Since last visit, tremor and movements doing better on higher carb/levo. Had home PT and doing fair. Living with family support. Sometimes at home during day alone for a few hours. Last week thought she saw people in home that were not there. Also was in the hospital recently for cellulitis of right leg, now on abx course, and doing better.   NEW HPI (06/21/16): Since last visit, has continued on carb/levo. Parkinson's dz has progressed. Tremors continue. Having bowel and bladder control issues. More restless legs. Lives with daughter and grand-daughter. She is alone for several hours a day.   UPDATE 04/02/12: Doing well. No on/off fluctations. No wearing off. No dyskinesias. Some hoarse voice, and word finding diff developing. No falls. Using a walker. Some shoulder pain.  UPDATE 10/05/11: Patient started on carbidopa/levodopa, now taking one tablet 3 times a day, and feels better. She feels that her coordination and balance have improved. No side effects from medication. Her left arm still has trouble with coordination.  PRIOR HPI: 82 year old right-handed female with history of hypertension, diabetes, hypercholesterolemia, atrial fibrillation, here for evaluation of progressive gait difficulty, tremor, memory loss, hoarseness of voice. Symptoms started approximately 1-2 yrs ago and have been progressive. She has had multiple falls.  She started using a rolling walker with brakes several weeks ago. She has noted deterioration of her handwriting. She has  difficulty cooking particularly with repetitive motion such as beating an egg.  She has difficulty with dressing and other activities of daily living.  She also reports difficulty with getting her words out, memory loss and hoarse voice. She gets easily frustrated. She does report some anxiety. No constipation. No change in smell or taste. No sleep disturbance.   REVIEW OF SYSTEMS: Full 14 system review of systems performed and negative with exception of: speech diff easy bruising runny nose drooling leg swelling.    ALLERGIES: Allergies  Allergen Reactions  . Penicillins Hives and Itching  . Diphenoxylate-Atropine Other (See Comments)    Unknown reaction    HOME MEDICATIONS: Outpatient Medications Prior to Visit  Medication Sig Dispense Refill  . atenolol (TENORMIN) 25 MG tablet Take 25-50 mg by mouth See admin instructions. Give 50 mg in the morning and 25 mg in the evening    . atorvastatin (LIPITOR) 20 MG tablet Take 20 mg by mouth daily at 6 PM.     . carbidopa-levodopa (SINEMET IR) 25-100 MG tablet Take 2 tablets by mouth 3 (three) times daily. 540 tablet 4  . cholecalciferol (VITAMIN D) 1000 units tablet Take 2,000 Units by mouth daily.    . cyanocobalamin 2000 MCG tablet Take 2,000 mcg by mouth daily.    . digoxin (LANOXIN) 0.125 MG tablet Take 0.125 mg by mouth every evening.     . fish oil-omega-3 fatty acids 1000 MG capsule Take 2 g by mouth at bedtime.     Marland Kitchen glimepiride (AMARYL) 4 MG tablet Take 1 mg by mouth daily before breakfast.     . hydrochlorothiazide (HYDRODIURIL) 25 MG tablet Take 25  mg by mouth daily.    Marland Kitchen HYDROcodone-acetaminophen (NORCO/VICODIN) 5-325 MG tablet Take 1 tablet by mouth 2 (two) times daily as needed for moderate pain.    Marland Kitchen insulin glargine (LANTUS) 100 UNIT/ML injection Inject 0.06 mLs (6 Units total) into the skin 2 (two) times daily. (Patient taking differently: Inject 12 Units into the skin daily. ) 10 mL 11  . L-Methylfolate-B6-B12 (FOLTANX PO)  Take 1 tablet by mouth every evening.     Marland Kitchen lisinopril (PRINIVIL,ZESTRIL) 40 MG tablet Take 40 mg by mouth at bedtime.     . metFORMIN (GLUCOPHAGE) 1000 MG tablet Take 1,000 mg by mouth 2 (two) times daily with a meal.    . Multiple Vitamins-Minerals (MULTIVITAMIN & MINERAL PO) Take 1 tablet by mouth daily.    . Multiple Vitamins-Minerals (PRESERVISION AREDS) TABS Take 1 tablet by mouth daily.    Marland Kitchen warfarin (COUMADIN) 5 MG tablet Take 2.5-5 mg by mouth daily at 6 PM. Takes 2.5mg  on sat only Takes  all other days    . cefpodoxime (VANTIN) 200 MG tablet Take 1 tablet (200 mg total) by mouth every 12 (twelve) hours. (Patient not taking: Reported on 12/20/2016) 16 tablet 0   No facility-administered medications prior to visit.     PAST MEDICAL HISTORY: Past Medical History:  Diagnosis Date  . AF (atrial fibrillation) (HCC)   . Ankle fracture, right 1972  . Cellulitis of foot, right   . Diabetic retinopathy   . HTN (hypertension)   . Hyperlipidemia   . Low back pain   . Morbid obesity (HCC)   . Osteoporosis   . Type 2 diabetes mellitus (HCC) 1972  . Vitamin B12 deficiency     PAST SURGICAL HISTORY: Past Surgical History:  Procedure Laterality Date  . CHOLECYSTECTOMY  1985  . PACEMAKER INSERTION  around 2010 per pt daughter   [employer]    FAMILY HISTORY: Family History  Problem Relation Age of Onset  . Cancer Mother   . Heart attack Father   . Cancer Brother     SOCIAL HISTORY:  Social History   Social History  . Marital status: Married    Spouse name: N/A  . Number of children: 3  . Years of education: 12   Occupational History  . Retired    Social History Main Topics  . Smoking status: Never Smoker  . Smokeless tobacco: Never Used  . Alcohol use No  . Drug use: No  . Sexual activity: Not on file   Other Topics Concern  . Not on file   Social History Narrative   Granddaughter lives with her and her daughter   Caffeine use: Drinks 1 soda/week   Drinks  water mostly    Coffee daily     PHYSICAL EXAM  GENERAL EXAM/CONSTITUTIONAL: Vitals:  Vitals:   12/20/16 1512  BP: (!) 165/69  Pulse: (!) 59  Weight: 200 lb (90.7 kg)  Height:  (1.626 m)   Body mass index is 34.33 kg/m. No exam data present  Patient is in no distress; well developed, nourished and groomed; neck is supple  CARDIOVASCULAR:  Examination of carotid arteries is normal; no carotid bruits  Regular rate and rhythm, no murmurs  Examination of peripheral vascular system by observation and palpation is normal  SEVERE EDEMA IN BILATERAL LEGS  EYES:  Ophthalmoscopic exam of optic discs and posterior segments is normal; no papilledema or hemorrhages  MUSCULOSKELETAL:  Gait, strength, tone, movements noted in Neurologic exam below  NEUROLOGIC:  MENTAL STATUS:  No flowsheet data found.  awake, alert, oriented to person, place and time  recent and remote memory intact  normal attention and concentration  SLIGHTLY DECR FLUENCY; comprehension intact, naming intact,   fund of knowledge appropriate  CRANIAL NERVE:   2nd - no papilledema on fundoscopic exam  2nd, 3rd, 4th, 6th - pupils equal and reactive to light, visual fields full to confrontation, extraocular muscles intact, no nystagmus  5th - facial sensation symmetric  7th - facial strength symmetric  8th - hearing intact  9th - palate elevates symmetrically, uvula midline  11th - shoulder shrug symmetric  12th - tongue protrusion midline  HOARSE, SLOW SPEECH  MOTOR:   normal bulk   COGWHEELING IN LUE > RUE  BUE 4 PROX, 3 DISTAL  BLE 2-3 PROX, 2 DISTAL  SUBTLE RESTING TREMOR IN BUE  BRADYKINESIA IN BUE AND BLE  SENSORY:   normal and symmetric to light touch  COORDINATION:   finger-nose-finger, fine finger movements normal  REFLEXES:   deep tendon reflexes TRACE and symmetric  GAIT/STATION:   IN TRANSPORT CHAIR    DIAGNOSTIC DATA (LABS, IMAGING, TESTING) -  I reviewed patient records, labs, notes, testing and imaging myself where available.  Lab Results  Component Value Date   WBC 11.4 (H) 12/13/2016   HGB 10.3 (L) 12/13/2016   HCT 32.1 (L) 12/13/2016   MCV 82.5 12/13/2016   PLT 266 12/13/2016      Component Value Date/Time   NA 144 12/13/2016 0342   K 3.3 (L) 12/13/2016 0342   CL 107 12/13/2016 0342   CO2 29 12/13/2016 0342   GLUCOSE 92 12/13/2016 0342   BUN 20 12/13/2016 0342   CREATININE 1.04 (H) 12/13/2016 0342   CALCIUM 9.1 12/13/2016 0342   PROT 6.7 12/21/2014 1205   ALBUMIN 2.8 (L) 12/21/2014 1205   AST 33 12/21/2014 1205   ALT 8 12/21/2014 1205   ALKPHOS 85 12/21/2014 1205   BILITOT 0.9 12/21/2014 1205   GFRNONAA 49 (L) 12/13/2016 0342   GFRAA 57 (L) 12/13/2016 0342   No results found for: CHOL, HDL, LDLCALC, LDLDIRECT, TRIG, CHOLHDL Lab Results  Component Value Date   HGBA1C 7.6 (H) 12/11/2016   No results found for: VITAMINB12 Lab Results  Component Value Date   TSH 2.184 Test methodology is 3rd generation TSH 07/31/2009    07/04/11 CT head (without contrast)  - mild-moderate chronic small vessel ischemic disease.    ASSESSMENT AND PLAN  82 y.o. year old female with multiple vascular risk factors, with progressive gait decline, tremor, word finding difficulty, difficulty with fine finger movements and tasks.  Has subjective memory complaints.  Trial of carb/levo has helped in the past.   Has had more significant progression, gait difficulty and general decline over 2017.    Dx: parkinson's disease  Parkinson disease (HCC)  Gait difficulty   PLAN: I spent 15 minutes of face to face time with patient. Greater than 50% of time was spent in counseling and coordination of care with patient. In summary we discussed:  - continue carbidopa/levodopa to 2 tabs three times per day - consider adult center for enrichment for activities - monitor hallucinations; mild and non-threatening at this time and patient  has good insight  Meds ordered this encounter  Medications  . carbidopa-levodopa (SINEMET IR) 25-100 MG tablet    Sig: Take 2 tablets by mouth 3 (three) times daily.    Dispense:  540 tablet    Refill:  4  Return in about 6 months (around 06/21/2017).    Suanne Marker, MD 12/20/2016, 4:02 PM Certified in Neurology, Neurophysiology and Neuroimaging  [facility] [address] [place], [place] [address] [phone]

## 2017-01-04 ENCOUNTER — Ambulatory Visit (INDEPENDENT_AMBULATORY_CARE_PROVIDER_SITE_OTHER): Payer: Medicare Other | Admitting: *Deleted

## 2017-01-04 ENCOUNTER — Telehealth: Payer: Self-pay | Admitting: Cardiology

## 2017-01-04 DIAGNOSIS — I480 Paroxysmal atrial fibrillation: Secondary | ICD-10-CM | POA: Diagnosis not present

## 2017-01-04 NOTE — Telephone Encounter (Signed)
Spoke with pt and reminded pt of remote transmission that is due today. Pt verbalized understanding.   

## 2017-01-05 LAB — CUP PACEART REMOTE DEVICE CHECK
Brady Statistic AS VS Percent: 0 %
Date Time Interrogation Session: 20180425182557
Implantable Lead Implant Date: 20101119
Implantable Lead Implant Date: 20101119
Lead Channel Impedance Value: 518 Ohm
Lead Channel Pacing Threshold Amplitude: 0.5 V
Lead Channel Setting Pacing Amplitude: 2 V
Lead Channel Setting Pacing Amplitude: 2.5 V
Lead Channel Setting Sensing Sensitivity: 2 mV
MDC IDC LEAD LOCATION: 753859
MDC IDC LEAD LOCATION: 753860
MDC IDC MSMT BATTERY IMPEDANCE: 845 Ohm
MDC IDC MSMT BATTERY REMAINING LONGEVITY: 61 mo
MDC IDC MSMT BATTERY VOLTAGE: 2.78 V
MDC IDC MSMT LEADCHNL RA IMPEDANCE VALUE: 446 Ohm
MDC IDC MSMT LEADCHNL RA PACING THRESHOLD AMPLITUDE: 0.875 V
MDC IDC MSMT LEADCHNL RA PACING THRESHOLD PULSEWIDTH: 0.4 ms
MDC IDC MSMT LEADCHNL RV PACING THRESHOLD PULSEWIDTH: 0.4 ms
MDC IDC PG IMPLANT DT: 20101119
MDC IDC SET LEADCHNL RV PACING PULSEWIDTH: 0.4 ms
MDC IDC STAT BRADY AP VP PERCENT: 55 %
MDC IDC STAT BRADY AP VS PERCENT: 0 %
MDC IDC STAT BRADY AS VP PERCENT: 45 %

## 2017-01-05 NOTE — Progress Notes (Signed)
Remote pacemaker transmission.   

## 2017-01-06 ENCOUNTER — Encounter: Payer: Self-pay | Admitting: Cardiology

## 2017-04-05 ENCOUNTER — Telehealth: Payer: Self-pay | Admitting: Cardiology

## 2017-04-05 ENCOUNTER — Ambulatory Visit (INDEPENDENT_AMBULATORY_CARE_PROVIDER_SITE_OTHER): Payer: Medicare Other | Admitting: *Deleted

## 2017-04-05 DIAGNOSIS — I480 Paroxysmal atrial fibrillation: Secondary | ICD-10-CM

## 2017-04-05 NOTE — Progress Notes (Signed)
Remote pacemaker transmission.   

## 2017-04-05 NOTE — Telephone Encounter (Signed)
Spoke with pt and reminded pt of remote transmission that is due today. Pt verbalized understanding.   

## 2017-04-06 ENCOUNTER — Encounter: Payer: Self-pay | Admitting: Cardiology

## 2017-05-02 LAB — CUP PACEART REMOTE DEVICE CHECK
Battery Remaining Longevity: 59 mo
Battery Voltage: 2.78 V
Brady Statistic AP VP Percent: 50 %
Brady Statistic AS VP Percent: 50 %
Implantable Lead Implant Date: 20101119
Implantable Lead Model: 5076
Implantable Pulse Generator Implant Date: 20101119
Lead Channel Impedance Value: 492 Ohm
Lead Channel Pacing Threshold Amplitude: 0.875 V
Lead Channel Setting Pacing Amplitude: 2 V
Lead Channel Setting Pacing Pulse Width: 0.4 ms
MDC IDC LEAD IMPLANT DT: 20101119
MDC IDC LEAD LOCATION: 753859
MDC IDC LEAD LOCATION: 753860
MDC IDC MSMT BATTERY IMPEDANCE: 947 Ohm
MDC IDC MSMT LEADCHNL RA PACING THRESHOLD PULSEWIDTH: 0.4 ms
MDC IDC MSMT LEADCHNL RV IMPEDANCE VALUE: 555 Ohm
MDC IDC MSMT LEADCHNL RV PACING THRESHOLD AMPLITUDE: 0.625 V
MDC IDC MSMT LEADCHNL RV PACING THRESHOLD PULSEWIDTH: 0.4 ms
MDC IDC SESS DTM: 20180725163922
MDC IDC SET LEADCHNL RV PACING AMPLITUDE: 2.5 V
MDC IDC SET LEADCHNL RV SENSING SENSITIVITY: 2 mV
MDC IDC STAT BRADY AP VS PERCENT: 0 %
MDC IDC STAT BRADY AS VS PERCENT: 0 %

## 2017-06-27 ENCOUNTER — Ambulatory Visit (INDEPENDENT_AMBULATORY_CARE_PROVIDER_SITE_OTHER): Payer: Medicare Other | Admitting: Diagnostic Neuroimaging

## 2017-06-27 ENCOUNTER — Encounter: Payer: Self-pay | Admitting: Diagnostic Neuroimaging

## 2017-06-27 VITALS — BP 191/77 | HR 59 | Wt 185.6 lb

## 2017-06-27 DIAGNOSIS — R269 Unspecified abnormalities of gait and mobility: Secondary | ICD-10-CM | POA: Diagnosis not present

## 2017-06-27 DIAGNOSIS — G20 Parkinson's disease: Secondary | ICD-10-CM | POA: Diagnosis not present

## 2017-06-27 MED ORDER — CARBIDOPA-LEVODOPA 25-100 MG PO TABS: 2.0000 | ORAL_TABLET | Freq: Three times a day (TID) | ORAL | 4 refills | Status: AC

## 2017-06-27 NOTE — Progress Notes (Signed)
[facility]  PATIENT: Leah [name] DOB: [birth year]  REFERRING CLINICIAN: M Perini HISTORY FROM: patient and daughter  REASON FOR VISIT: follow up   HISTORICAL  CHIEF COMPLAINT:  Chief Complaint  Patient presents with  . Parkinson disease    rm 6, dgtr- Geraldine Solar, "left hand red, swollen, sore"  . Follow-up    6 month    HISTORY OF PRESENT ILLNESS:   UPDATE (06/27/17, VRP): Since last visit, doing fair. Tolerating carb/levo. No alleviating or aggravating factors. No falls.  UPDATE 12/20/16: Since last visit, tremor and movements doing better on higher carb/levo. Had home PT and doing fair. Living with family support. Sometimes at home during day alone for a few hours. Last week thought she saw people in home that were not there. Also was in the hospital recently for cellulitis of right leg, now on abx course, and doing better.   NEW HPI (06/21/16): Since last visit, has continued on carb/levo. Parkinson's dz has progressed. Tremors continue. Having bowel and bladder control issues. More restless legs. Lives with daughter and grand-daughter. She is alone for several hours a day.   UPDATE 04/02/12: Doing well. No on/off fluctations. No wearing off. No dyskinesias. Some hoarse voice, and word finding diff developing. No falls. Using a walker. Some shoulder pain.  UPDATE 10/05/11: Patient started on carbidopa/levodopa, now taking one tablet 3 times a day, and feels better. She feels that her coordination and balance have improved. No side effects from medication. Her left arm still has trouble with coordination.  PRIOR HPI: 82 year old right-handed female with history of hypertension, diabetes, hypercholesterolemia, atrial fibrillation, here for evaluation of progressive gait difficulty, tremor, memory loss, hoarseness of voice. Symptoms started approximately 1-2 yrs ago and have been progressive. She has had multiple falls.  She started using a rolling walker with  brakes several weeks ago. She has noted deterioration of her handwriting. She has difficulty cooking particularly with repetitive motion such as beating an egg.  She has difficulty with dressing and other activities of daily living.  She also reports difficulty with getting her words out, memory loss and hoarse voice. She gets easily frustrated. She does report some anxiety. No constipation. No change in smell or taste. No sleep disturbance.   REVIEW OF SYSTEMS: Full 14 system review of systems performed and negative with exception of: memory loss runny nose walking diff.     ALLERGIES: Allergies  Allergen Reactions  . Penicillins Hives and Itching  . Diphenoxylate-Atropine Other (See Comments)    Unknown reaction    HOME MEDICATIONS: Outpatient Medications Prior to Visit  Medication Sig Dispense Refill  . atenolol (TENORMIN) 25 MG tablet Take 25-50 mg by mouth See admin instructions. Give 50 mg in the morning and 25 mg in the evening    . atorvastatin (LIPITOR) 20 MG tablet Take 20 mg by mouth daily at 6 PM.     . carbidopa-levodopa (SINEMET IR) 25-100 MG tablet Take 2 tablets by mouth 3 (three) times daily. 540 tablet 4  . cholecalciferol (VITAMIN D) 1000 units tablet Take 2,000 Units by mouth daily.    . cyanocobalamin 2000 MCG tablet Take 2,000 mcg by mouth daily.    . digoxin (LANOXIN) 0.125 MG tablet Take 0.125 mg by mouth every evening.     . fish oil-omega-3 fatty acids 1000 MG capsule Take 2 g by mouth at bedtime.     Marland Kitchen glimepiride (AMARYL) 4 MG tablet Take 1 mg by mouth daily before breakfast.     .  hydrochlorothiazide (HYDRODIURIL) 25 MG tablet Take 25 mg by mouth daily.    Marland Kitchen HYDROcodone-acetaminophen (NORCO/VICODIN) 5-325 MG tablet Take 1 tablet by mouth 2 (two) times daily as needed for moderate pain.    Marland Kitchen insulin glargine (LANTUS) 100 UNIT/ML injection Inject 0.06 mLs (6 Units total) into the skin 2 (two) times daily. (Patient taking differently: Inject 12 Units into the  skin daily. ) 10 mL 11  . L-Methylfolate-B6-B12 (FOLTANX PO) Take 1 tablet by mouth every evening.     Marland Kitchen lisinopril (PRINIVIL,ZESTRIL) 40 MG tablet Take 40 mg by mouth at bedtime.     . metFORMIN (GLUCOPHAGE) 1000 MG tablet Take 1,000 mg by mouth 2 (two) times daily with a meal.    . Multiple Vitamins-Minerals (MULTIVITAMIN & MINERAL PO) Take 1 tablet by mouth daily.    . Multiple Vitamins-Minerals (PRESERVISION AREDS) TABS Take 1 tablet by mouth daily.    Marland Kitchen warfarin (COUMADIN) 5 MG tablet Take 2.5-5 mg by mouth daily at 6 PM. Takes 2.5mg  on sat only Takes  all other days     No facility-administered medications prior to visit.     PAST MEDICAL HISTORY: Past Medical History:  Diagnosis Date  . AF (atrial fibrillation) (HCC)   . Ankle fracture, right 1972  . Cellulitis of foot, right   . Diabetic retinopathy   . HTN (hypertension)   . Hyperlipidemia   . Low back pain   . Morbid obesity (HCC)   . Osteoporosis   . Parkinson disease (HCC)   . Type 2 diabetes mellitus (HCC) 1972  . Vitamin B12 deficiency     PAST SURGICAL HISTORY: Past Surgical History:  Procedure Laterality Date  . CHOLECYSTECTOMY  1985  . PACEMAKER INSERTION  around 2010 per pt daughter   [employer]    FAMILY HISTORY: Family History  Problem Relation Age of Onset  . Cancer Mother   . Heart attack Father   . Cancer Brother     SOCIAL HISTORY:  Social History   Social History  . Marital status: Married    Spouse name: N/A  . Number of children: 3  . Years of education: 12   Occupational History  . Retired    Social History Main Topics  . Smoking status: Never Smoker  . Smokeless tobacco: Never Used  . Alcohol use No  . Drug use: No  . Sexual activity: Not on file   Other Topics Concern  . Not on file   Social History Narrative   Granddaughter lives with her and her daughter   Caffeine use: Drinks 1 soda/week   Drinks water mostly    Coffee daily     PHYSICAL EXAM  GENERAL  EXAM/CONSTITUTIONAL: Vitals:  Vitals:   06/27/17 1516  BP: (!) 191/77  Pulse: (!) 59  Weight: 185 lb 9.6 oz (84.2 kg)   Body mass index is 31.86 kg/m. No exam data present  Patient is in no distress; well developed, nourished and groomed; neck is supple  CARDIOVASCULAR:  Examination of carotid arteries is normal; no carotid bruits  Regular rate and rhythm, no murmurs  Examination of peripheral vascular system by observation and palpation is normal  MODERATE EDEMA IN BILATERAL LEGS  EYES:  Ophthalmoscopic exam of optic discs and posterior segments is normal; no papilledema or hemorrhages  MUSCULOSKELETAL:  Gait, strength, tone, movements noted in Neurologic exam below  NEUROLOGIC: MENTAL STATUS:  No flowsheet data found.  awake, alert, oriented to person, place and time  recent and  remote memory intact  normal attention and concentration  SLIGHTLY DECR FLUENCY; comprehension intact, naming intact,   fund of knowledge appropriate  CRANIAL NERVE:   2nd - no papilledema on fundoscopic exam  2nd, 3rd, 4th, 6th - pupils equal and reactive to light, visual fields full to confrontation, extraocular muscles intact, no nystagmus  5th - facial sensation symmetric  7th - facial strength symmetric  8th - hearing intact  9th - palate elevates symmetrically, uvula midline  11th - shoulder shrug symmetric  12th - tongue protrusion midline  HOARSE, SLOW SPEECH  MOTOR:   normal bulk   COGWHEELING IN LUE > RUE  BUE 4 PROX, 3 DISTAL  BLE 2-3 PROX, 2 DISTAL  SUBTLE RESTING TREMOR IN LUE > RUE  BRADYKINESIA IN BUE AND BLE  SENSORY:   normal and symmetric to light touch  COORDINATION:   finger-nose-finger, fine finger movements normal  REFLEXES:   deep tendon reflexes TRACE and symmetric  GAIT/STATION:   SITTING IN ROLLATOR WALKER    DIAGNOSTIC DATA (LABS, IMAGING, TESTING) - I reviewed patient records, labs, notes, testing and imaging  myself where available.  Lab Results  Component Value Date   WBC 11.4 (H) 12/13/2016   HGB 10.3 (L) 12/13/2016   HCT 32.1 (L) 12/13/2016   MCV 82.5 12/13/2016   PLT 266 12/13/2016      Component Value Date/Time   NA 144 12/13/2016 0342   K 3.3 (L) 12/13/2016 0342   CL 107 12/13/2016 0342   CO2 29 12/13/2016 0342   GLUCOSE 92 12/13/2016 0342   BUN 20 12/13/2016 0342   CREATININE 1.04 (H) 12/13/2016 0342   CALCIUM 9.1 12/13/2016 0342   PROT 6.7 12/21/2014 1205   ALBUMIN 2.8 (L) 12/21/2014 1205   AST 33 12/21/2014 1205   ALT 8 12/21/2014 1205   ALKPHOS 85 12/21/2014 1205   BILITOT 0.9 12/21/2014 1205   GFRNONAA 49 (L) 12/13/2016 0342   GFRAA 57 (L) 12/13/2016 0342   No results found for: CHOL, HDL, LDLCALC, LDLDIRECT, TRIG, CHOLHDL Lab Results  Component Value Date   HGBA1C 7.6 (H) 12/11/2016   No results found for: VITAMINB12 Lab Results  Component Value Date   TSH 2.184 Test methodology is 3rd generation TSH 07/31/2009    07/04/11 CT head (without contrast)  - mild-moderate chronic small vessel ischemic disease.    ASSESSMENT AND PLAN  82 y.o. year old female with multiple vascular risk factors, with progressive gait decline, tremor, word finding difficulty, difficulty with fine finger movements and tasks.  Has subjective memory complaints.  Trial of carb/levo has helped in the past.   Has had more significant progression, gait difficulty and general decline over 2017.    Dx: parkinson's disease  Parkinson disease (HCC)  Gait difficulty   PLAN:   I spent 25 minutes of face to face time with patient. Greater than 50% of time was spent in counseling and coordination of care with patient. In summary we discussed:   PARKINSON'S DISEASE (stable) - continue carbidopa/levodopa to 2 tabs three times per day - use rollator walker - consider adult center for enrichment for activities - monitor hallucinations; mild and non-threatening at this time and patient  has good insight; may consider nuplazid in future  Meds ordered this encounter  Medications  . carbidopa-levodopa (SINEMET IR) 25-100 MG tablet    Sig: Take 2 tablets by mouth 3 (three) times daily.    Dispense:  540 tablet    Refill:  4  Return in about 6 months (around 12/26/2017) for with NP.    Suanne Marker, MD 06/27/2017, 4:00 PM Certified in Neurology, Neurophysiology and Neuroimaging  [facility] [address], [address] [place], [place] [address] [phone]

## 2017-07-05 ENCOUNTER — Ambulatory Visit (INDEPENDENT_AMBULATORY_CARE_PROVIDER_SITE_OTHER): Payer: Medicare Other | Admitting: *Deleted

## 2017-07-05 DIAGNOSIS — I480 Paroxysmal atrial fibrillation: Secondary | ICD-10-CM | POA: Diagnosis not present

## 2017-07-05 NOTE — Progress Notes (Signed)
Remote pacemaker transmission.   

## 2017-07-07 ENCOUNTER — Encounter: Payer: Self-pay | Admitting: Cardiology

## 2017-07-07 LAB — CUP PACEART REMOTE DEVICE CHECK
Brady Statistic AP VS Percent: 0 %
Brady Statistic AS VS Percent: 0 %
Date Time Interrogation Session: 20181024182442
Implantable Lead Implant Date: 20101119
Implantable Lead Location: 753860
Lead Channel Impedance Value: 478 Ohm
Lead Channel Impedance Value: 562 Ohm
Lead Channel Pacing Threshold Amplitude: 0.625 V
Lead Channel Pacing Threshold Pulse Width: 0.4 ms
Lead Channel Setting Sensing Sensitivity: 2 mV
MDC IDC LEAD IMPLANT DT: 20101119
MDC IDC LEAD LOCATION: 753859
MDC IDC MSMT BATTERY IMPEDANCE: 1000 Ohm
MDC IDC MSMT BATTERY REMAINING LONGEVITY: 56 mo
MDC IDC MSMT BATTERY VOLTAGE: 2.78 V
MDC IDC MSMT LEADCHNL RA PACING THRESHOLD AMPLITUDE: 0.875 V
MDC IDC MSMT LEADCHNL RV PACING THRESHOLD PULSEWIDTH: 0.4 ms
MDC IDC PG IMPLANT DT: 20101119
MDC IDC SET LEADCHNL RA PACING AMPLITUDE: 2 V
MDC IDC SET LEADCHNL RV PACING AMPLITUDE: 2.5 V
MDC IDC SET LEADCHNL RV PACING PULSEWIDTH: 0.4 ms
MDC IDC STAT BRADY AP VP PERCENT: 47 %
MDC IDC STAT BRADY AS VP PERCENT: 53 %

## 2017-10-04 ENCOUNTER — Telehealth: Payer: Self-pay | Admitting: Cardiology

## 2017-10-04 ENCOUNTER — Ambulatory Visit (INDEPENDENT_AMBULATORY_CARE_PROVIDER_SITE_OTHER): Payer: Medicare Other | Admitting: *Deleted

## 2017-10-04 DIAGNOSIS — I480 Paroxysmal atrial fibrillation: Secondary | ICD-10-CM

## 2017-10-04 NOTE — Progress Notes (Signed)
Remote pacemaker transmission.   

## 2017-10-04 NOTE — Telephone Encounter (Signed)
Confirmed remote transmission w/ pt.   

## 2017-10-05 ENCOUNTER — Encounter: Payer: Self-pay | Admitting: Cardiology

## 2017-10-17 LAB — CUP PACEART REMOTE DEVICE CHECK
Battery Impedance: 1080 Ohm
Battery Voltage: 2.78 V
Brady Statistic AP VP Percent: 32 %
Brady Statistic AP VS Percent: 0 %
Brady Statistic AS VP Percent: 63 %
Brady Statistic AS VS Percent: 4 %
Date Time Interrogation Session: 20190123200243
Implantable Lead Implant Date: 20101119
Implantable Lead Location: 753860
Implantable Lead Model: 5076
Implantable Lead Model: 5076
Lead Channel Impedance Value: 546 Ohm
Lead Channel Pacing Threshold Amplitude: 0.875 V
Lead Channel Pacing Threshold Pulse Width: 0.4 ms
Lead Channel Pacing Threshold Pulse Width: 0.4 ms
MDC IDC LEAD IMPLANT DT: 20101119
MDC IDC LEAD LOCATION: 753859
MDC IDC MSMT BATTERY REMAINING LONGEVITY: 54 mo
MDC IDC MSMT LEADCHNL RA IMPEDANCE VALUE: 472 Ohm
MDC IDC MSMT LEADCHNL RV PACING THRESHOLD AMPLITUDE: 0.5 V
MDC IDC PG IMPLANT DT: 20101119
MDC IDC SET LEADCHNL RA PACING AMPLITUDE: 2 V
MDC IDC SET LEADCHNL RV PACING AMPLITUDE: 2.5 V
MDC IDC SET LEADCHNL RV PACING PULSEWIDTH: 0.4 ms
MDC IDC SET LEADCHNL RV SENSING SENSITIVITY: 2 mV

## 2017-11-10 ENCOUNTER — Encounter: Payer: Self-pay | Admitting: Internal Medicine

## 2017-11-10 ENCOUNTER — Ambulatory Visit (INDEPENDENT_AMBULATORY_CARE_PROVIDER_SITE_OTHER): Payer: Medicare Other | Admitting: Internal Medicine

## 2017-11-10 VITALS — BP 110/90 | HR 56 | Ht 64.0 in | Wt 191.0 lb

## 2017-11-10 DIAGNOSIS — Z5181 Encounter for therapeutic drug level monitoring: Secondary | ICD-10-CM

## 2017-11-10 DIAGNOSIS — I480 Paroxysmal atrial fibrillation: Secondary | ICD-10-CM

## 2017-11-10 DIAGNOSIS — Z950 Presence of cardiac pacemaker: Secondary | ICD-10-CM | POA: Diagnosis not present

## 2017-11-10 DIAGNOSIS — I442 Atrioventricular block, complete: Secondary | ICD-10-CM

## 2017-11-10 DIAGNOSIS — Z79899 Other long term (current) drug therapy: Secondary | ICD-10-CM | POA: Diagnosis not present

## 2017-11-10 NOTE — Patient Instructions (Addendum)
Medication Instructions:  Your physician recommends that you continue on your current medications as directed. Please refer to the Current Medication list given to you today.  Labwork: You will get lab work today:  Digoxin level.  Testing/Procedures: None ordered.  Follow-Up: Your physician wants you to follow-up in: one year with Dr. Ladona Ridgelaylor.   You will receive a reminder letter in the mail two months in advance. If you don't receive a letter, please call our office to schedule the follow-up appointment.  Remote monitoring is used to monitor your Pacemaker from home. This monitoring reduces the number of office visits required to check your device to one time per year. It allows us to keep an eye on the functioning of your device to ensure it is working properly. You are scheduled for a device check from home on 01/03/2018. You may send your transmission at any time that day. If you have a wireless device, the transmission will be sent automatically. After your physician reviews your transmission, you will receive a postcard with your next transmission date.  Any Other Special Instructions Will Be Listed Below (If Applicable).  If you need a refill on your cardiac medications before your next appointment, please call your pharmacy.

## 2017-11-10 NOTE — Progress Notes (Signed)
HPI Mrs. Chase PicketLineberry returns today for followup. She is a very pleasant 83 year old woman with a history of symptomatic bradycardia, hypertension, paroxysmal atrial fibrillation, and dyslipidemia. She is status post permanent pacemaker insertion, now with CHB. In the interim, she has been stable except for some weight loss, and denies palpitations, chest pain, or shortness of breath. No syncope. She has chronic venous insufficiency and has had problems with cellulitis in her legs.  Allergies  Allergen Reactions  . Penicillins Hives and Itching  . Diphenoxylate-Atropine Other (See Comments)    Unknown reaction     Current Outpatient Medications  Medication Sig Dispense Refill  . atenolol (TENORMIN) 25 MG tablet Take 25-50 mg by mouth See admin instructions. Give 50 mg in the morning and 25 mg in the evening    . atorvastatin (LIPITOR) 20 MG tablet Take 20 mg by mouth daily at 6 PM.     . carbidopa-levodopa (SINEMET IR) 25-100 MG tablet Take 2 tablets by mouth 3 (three) times daily. 540 tablet 4  . cephALEXin (KEFLEX) 250 MG capsule 250 mg daily.    . cholecalciferol (VITAMIN D) 1000 units tablet Take 2,000 Units by mouth daily.    . cyanocobalamin 2000 MCG tablet Take 2,000 mcg by mouth daily.    . digoxin (LANOXIN) 0.125 MG tablet Take 0.125 mg by mouth every evening.     . fish oil-omega-3 fatty acids 1000 MG capsule Take 2 g by mouth at bedtime.     Marland Kitchen. glimepiride (AMARYL) 4 MG tablet Take 1 mg by mouth daily before breakfast.     . hydrochlorothiazide (HYDRODIURIL) 25 MG tablet Take 25 mg by mouth daily.    Marland Kitchen. HYDROcodone-acetaminophen (NORCO/VICODIN) 5-325 MG tablet Take 1 tablet by mouth 2 (two) times daily as needed for moderate pain.    Marland Kitchen. insulin glargine (LANTUS) 100 UNIT/ML injection Inject 0.06 mLs (6 Units total) into the skin 2 (two) times daily. (Patient taking differently: Inject 12 Units into the skin daily. ) 10 mL 11  . L-Methylfolate-B6-B12 (FOLTANX PO) Take 1 tablet  by mouth every evening.     Marland Kitchen. lisinopril (PRINIVIL,ZESTRIL) 40 MG tablet Take 40 mg by mouth at bedtime.     . metFORMIN (GLUCOPHAGE) 1000 MG tablet Take 1,000 mg by mouth 2 (two) times daily with a meal.    . Multiple Vitamins-Minerals (MULTIVITAMIN & MINERAL PO) Take 1 tablet by mouth daily.    . Multiple Vitamins-Minerals (PRESERVISION AREDS) TABS Take 1 tablet by mouth daily.    Marland Kitchen. warfarin (COUMADIN) 5 MG tablet Take 2.5-5 mg by mouth daily at 6 PM. Takes 2.5mg  on sat only Takes 5mg  all other days     No current facility-administered medications for this visit.      Past Medical History:  Diagnosis Date  . AF (atrial fibrillation) (HCC)   . Ankle fracture, right 1972  . Cellulitis of foot, right   . Diabetic retinopathy   . HTN (hypertension)   . Hyperlipidemia   . Low back pain   . Morbid obesity (HCC)   . Osteoporosis   . Parkinson disease (HCC)   . Type 2 diabetes mellitus (HCC) 1972  . Vitamin B12 deficiency     ROS:   All systems reviewed and negative except as noted in the HPI.   Past Surgical History:  Procedure Laterality Date  . CHOLECYSTECTOMY  1985  . PACEMAKER INSERTION  around 2010 per pt daughter   [employer]     Family  History  Problem Relation Age of Onset  . Cancer Mother   . Heart attack Father   . Cancer Brother      Social History   Socioeconomic History  . Marital status: Married    Spouse name: Not on file  . Number of children: 3  . Years of education: 54  . Highest education level: Not on file  Social Needs  . Financial resource strain: Not on file  . Food insecurity - worry: Not on file  . Food insecurity - inability: Not on file  . Transportation needs - medical: Not on file  . Transportation needs - non-medical: Not on file  Occupational History  . Occupation: Retired  Tobacco Use  . Smoking status: Never Smoker  . Smokeless tobacco: Never Used  Substance and Sexual Activity  . Alcohol use: No  . Drug use: No  .  Sexual activity: Not on file  Other Topics Concern  . Not on file  Social History Narrative   Granddaughter lives with her and her daughter   Caffeine use: Drinks 1 soda/week   Drinks water mostly    Coffee daily     BP 110/90 (BP Location: Right Arm, Patient Position: Sitting, Cuff Size: Normal)   Pulse (!) 56   Ht 5\' 4"  (1.626 m)   Wt 191 lb (86.6 kg)   SpO2 96%   BMI 32.79 kg/m   Physical Exam:  Well appearing NAD HEENT: Unremarkable Neck:  No JVD, no thyromegally Lymphatics:  No adenopathy Back:  No CVA tenderness Lungs:  Rales in the bases bilaterally HEART:  IRegular tachy rate rhythm, no murmurs, no rubs, no clicks Abd:  soft, positive bowel sounds, no organomegally, no rebound, no guarding Ext:  2 plus pulses, 3+ edema, no cyanosis, no clubbing Skin:  No rashes no nodules Neuro:  CN II through XII intact, motor grossly intact  EKG - atrial fib with a RVR  DEVICE  Normal device function.  See PaceArt for details.   Assess/Plan: 1. Uncontrolled atrial fib - I discussed increasing her AV nodal blocking drugs. She denies medical non-compliance. She refuses to take more beta blocker. I will ask her to check a digoxin level. 2. Venous insufficiency - I asked the patient to consider support stockings or to wrap her legs but again she refuses. I asked her to try and keep her legs elevated. 3. Obesity - she is quite sedentary. I encouraged weight loss. This will be difficult.   Leah [name].D.

## 2017-11-11 LAB — DIGOXIN LEVEL: Digoxin, Serum: 1.4 ng/mL — ABNORMAL HIGH (ref 0.5–0.9)

## 2017-11-13 LAB — CUP PACEART INCLINIC DEVICE CHECK
Battery Remaining Longevity: 54 mo
Brady Statistic AP VS Percent: 0 %
Brady Statistic AS VP Percent: 67 %
Brady Statistic AS VS Percent: 4 %
Implantable Lead Implant Date: 20101119
Implantable Lead Location: 753859
Implantable Lead Location: 753860
Implantable Lead Model: 5076
Implantable Pulse Generator Implant Date: 20101119
Lead Channel Impedance Value: 464 Ohm
Lead Channel Pacing Threshold Amplitude: 0.625 V
Lead Channel Pacing Threshold Pulse Width: 0.4 ms
Lead Channel Setting Pacing Amplitude: 2 V
Lead Channel Setting Pacing Amplitude: 2.5 V
Lead Channel Setting Sensing Sensitivity: 2 mV
MDC IDC LEAD IMPLANT DT: 20101119
MDC IDC MSMT BATTERY IMPEDANCE: 1104 Ohm
MDC IDC MSMT BATTERY VOLTAGE: 2.77 V
MDC IDC MSMT LEADCHNL RA PACING THRESHOLD AMPLITUDE: 0.875 V
MDC IDC MSMT LEADCHNL RA PACING THRESHOLD PULSEWIDTH: 0.4 ms
MDC IDC MSMT LEADCHNL RA SENSING INTR AMPL: 1.4 mV
MDC IDC MSMT LEADCHNL RV IMPEDANCE VALUE: 571 Ohm
MDC IDC MSMT LEADCHNL RV PACING THRESHOLD AMPLITUDE: 0.75 V
MDC IDC MSMT LEADCHNL RV PACING THRESHOLD PULSEWIDTH: 0.4 ms
MDC IDC SESS DTM: 20190301203825
MDC IDC SET LEADCHNL RV PACING PULSEWIDTH: 0.4 ms
MDC IDC STAT BRADY AP VP PERCENT: 29 %

## 2017-11-14 ENCOUNTER — Telehealth: Payer: Self-pay

## 2017-11-14 MED ORDER — DIGOXIN 125 MCG PO TABS: ORAL_TABLET | ORAL | 3 refills | Status: DC

## 2017-11-14 NOTE — Telephone Encounter (Signed)
Call placed to Pt.  Pt notified of digoxin level being 1.4.  Per Dr. Ladona Ridgelaylor, have Pt decrease digoxin to daily 5 days a week, do not take on Wednesday or Sunday.  Notified Pt and Pt daughter.  Both indicate understanding.

## 2017-11-30 ENCOUNTER — Other Ambulatory Visit: Payer: Self-pay

## 2017-11-30 ENCOUNTER — Emergency Department (HOSPITAL_COMMUNITY)
Admit: 2017-11-30 | Discharge: 2017-11-30 | Disposition: A | Discharge status: Home / Self Care | Payer: Medicare Other | Attending: Emergency Medicine | Admitting: Emergency Medicine

## 2017-11-30 ENCOUNTER — Encounter (HOSPITAL_COMMUNITY): Payer: Self-pay

## 2017-11-30 ENCOUNTER — Inpatient Hospital Stay (HOSPITAL_COMMUNITY)
Admission: EM | Admit: 2017-11-30 | Discharge: 2017-12-04 | DRG: 602 | Disposition: A | Discharge status: Skilled Nursing Facility | Payer: Medicare Other | Attending: Internal Medicine | Admitting: Internal Medicine

## 2017-11-30 DIAGNOSIS — Z794 Long term (current) use of insulin: Secondary | ICD-10-CM | POA: Diagnosis not present

## 2017-11-30 DIAGNOSIS — L03116 Cellulitis of left lower limb: Principal | ICD-10-CM | POA: Diagnosis present

## 2017-11-30 DIAGNOSIS — M545 Low back pain: Secondary | ICD-10-CM | POA: Diagnosis present

## 2017-11-30 DIAGNOSIS — M810 Age-related osteoporosis without current pathological fracture: Secondary | ICD-10-CM | POA: Diagnosis present

## 2017-11-30 DIAGNOSIS — N183 Chronic kidney disease, stage 3 unspecified: Secondary | ICD-10-CM

## 2017-11-30 DIAGNOSIS — M7989 Other specified soft tissue disorders: Secondary | ICD-10-CM

## 2017-11-30 DIAGNOSIS — Z91040 Latex allergy status: Secondary | ICD-10-CM

## 2017-11-30 DIAGNOSIS — G8929 Other chronic pain: Secondary | ICD-10-CM | POA: Diagnosis present

## 2017-11-30 DIAGNOSIS — I4892 Unspecified atrial flutter: Secondary | ICD-10-CM | POA: Diagnosis present

## 2017-11-30 DIAGNOSIS — Z66 Do not resuscitate: Secondary | ICD-10-CM | POA: Diagnosis present

## 2017-11-30 DIAGNOSIS — N179 Acute kidney failure, unspecified: Secondary | ICD-10-CM | POA: Diagnosis present

## 2017-11-30 DIAGNOSIS — Z792 Long term (current) use of antibiotics: Secondary | ICD-10-CM

## 2017-11-30 DIAGNOSIS — E11319 Type 2 diabetes mellitus with unspecified diabetic retinopathy without macular edema: Secondary | ICD-10-CM | POA: Diagnosis present

## 2017-11-30 DIAGNOSIS — Z87440 Personal history of urinary (tract) infections: Secondary | ICD-10-CM | POA: Diagnosis not present

## 2017-11-30 DIAGNOSIS — G20 Parkinson's disease: Secondary | ICD-10-CM | POA: Diagnosis present

## 2017-11-30 DIAGNOSIS — I110 Hypertensive heart disease with heart failure: Secondary | ICD-10-CM | POA: Diagnosis present

## 2017-11-30 DIAGNOSIS — L0390 Cellulitis, unspecified: Secondary | ICD-10-CM | POA: Diagnosis present

## 2017-11-30 DIAGNOSIS — N390 Urinary tract infection, site not specified: Secondary | ICD-10-CM | POA: Diagnosis present

## 2017-11-30 DIAGNOSIS — L03119 Cellulitis of unspecified part of limb: Secondary | ICD-10-CM | POA: Diagnosis not present

## 2017-11-30 DIAGNOSIS — D729 Disorder of white blood cells, unspecified: Secondary | ICD-10-CM

## 2017-11-30 DIAGNOSIS — I10 Essential (primary) hypertension: Secondary | ICD-10-CM | POA: Diagnosis not present

## 2017-11-30 DIAGNOSIS — Z79899 Other long term (current) drug therapy: Secondary | ICD-10-CM

## 2017-11-30 DIAGNOSIS — I878 Other specified disorders of veins: Secondary | ICD-10-CM | POA: Diagnosis present

## 2017-11-30 DIAGNOSIS — D649 Anemia, unspecified: Secondary | ICD-10-CM

## 2017-11-30 DIAGNOSIS — R410 Disorientation, unspecified: Secondary | ICD-10-CM | POA: Diagnosis not present

## 2017-11-30 DIAGNOSIS — L8990 Pressure ulcer of unspecified site, unspecified stage: Secondary | ICD-10-CM

## 2017-11-30 DIAGNOSIS — I4891 Unspecified atrial fibrillation: Secondary | ICD-10-CM | POA: Diagnosis present

## 2017-11-30 DIAGNOSIS — A419 Sepsis, unspecified organism: Secondary | ICD-10-CM

## 2017-11-30 DIAGNOSIS — I5032 Chronic diastolic (congestive) heart failure: Secondary | ICD-10-CM | POA: Diagnosis present

## 2017-11-30 DIAGNOSIS — R402413 Glasgow coma scale score 13-15, at hospital admission: Secondary | ICD-10-CM | POA: Diagnosis present

## 2017-11-30 DIAGNOSIS — G20A1 Parkinson's disease without dyskinesia, without mention of fluctuations: Secondary | ICD-10-CM | POA: Diagnosis present

## 2017-11-30 DIAGNOSIS — I480 Paroxysmal atrial fibrillation: Secondary | ICD-10-CM | POA: Diagnosis not present

## 2017-11-30 DIAGNOSIS — E1122 Type 2 diabetes mellitus with diabetic chronic kidney disease: Secondary | ICD-10-CM | POA: Diagnosis not present

## 2017-11-30 DIAGNOSIS — G9341 Metabolic encephalopathy: Secondary | ICD-10-CM | POA: Diagnosis present

## 2017-11-30 DIAGNOSIS — Z888 Allergy status to other drugs, medicaments and biological substances status: Secondary | ICD-10-CM

## 2017-11-30 DIAGNOSIS — R791 Abnormal coagulation profile: Secondary | ICD-10-CM | POA: Diagnosis present

## 2017-11-30 DIAGNOSIS — L03115 Cellulitis of right lower limb: Secondary | ICD-10-CM | POA: Diagnosis present

## 2017-11-30 DIAGNOSIS — M79609 Pain in unspecified limb: Secondary | ICD-10-CM

## 2017-11-30 DIAGNOSIS — Z880 Allergy status to penicillin: Secondary | ICD-10-CM

## 2017-11-30 DIAGNOSIS — E872 Acidosis, unspecified: Secondary | ICD-10-CM

## 2017-11-30 DIAGNOSIS — E785 Hyperlipidemia, unspecified: Secondary | ICD-10-CM | POA: Diagnosis present

## 2017-11-30 DIAGNOSIS — E0821 Diabetes mellitus due to underlying condition with diabetic nephropathy: Secondary | ICD-10-CM

## 2017-11-30 DIAGNOSIS — I872 Venous insufficiency (chronic) (peripheral): Secondary | ICD-10-CM | POA: Diagnosis present

## 2017-11-30 DIAGNOSIS — E538 Deficiency of other specified B group vitamins: Secondary | ICD-10-CM | POA: Diagnosis present

## 2017-11-30 DIAGNOSIS — Z7901 Long term (current) use of anticoagulants: Secondary | ICD-10-CM

## 2017-11-30 DIAGNOSIS — B964 Proteus (mirabilis) (morganii) as the cause of diseases classified elsewhere: Secondary | ICD-10-CM | POA: Diagnosis present

## 2017-11-30 DIAGNOSIS — Z950 Presence of cardiac pacemaker: Secondary | ICD-10-CM

## 2017-11-30 LAB — CBC WITH DIFFERENTIAL/PLATELET
Basophils Absolute: 0 10*3/uL (ref 0.0–0.1)
Basophils Relative: 0 %
EOS ABS: 0.1 10*3/uL (ref 0.0–0.7)
EOS PCT: 1 %
HCT: 37.4 % (ref 36.0–46.0)
Hemoglobin: 11.6 g/dL — ABNORMAL LOW (ref 12.0–15.0)
LYMPHS ABS: 3.1 10*3/uL (ref 0.7–4.0)
Lymphocytes Relative: 19 %
MCH: 27.6 pg (ref 26.0–34.0)
MCHC: 31 g/dL (ref 30.0–36.0)
MCV: 88.8 fL (ref 78.0–100.0)
MONOS PCT: 7 %
Monocytes Absolute: 1.2 10*3/uL — ABNORMAL HIGH (ref 0.1–1.0)
Neutro Abs: 12.1 10*3/uL — ABNORMAL HIGH (ref 1.7–7.7)
Neutrophils Relative %: 73 %
PLATELETS: 371 10*3/uL (ref 150–400)
RBC: 4.21 MIL/uL (ref 3.87–5.11)
RDW: 15.9 % — ABNORMAL HIGH (ref 11.5–15.5)
WBC: 16.5 10*3/uL — AB (ref 4.0–10.5)

## 2017-11-30 LAB — COMPREHENSIVE METABOLIC PANEL
ALBUMIN: 2.8 g/dL — AB (ref 3.5–5.0)
ALT: 5 U/L — AB (ref 14–54)
AST: 26 U/L (ref 15–41)
Alkaline Phosphatase: 87 U/L (ref 38–126)
Anion gap: 12 (ref 5–15)
BUN: 51 mg/dL — AB (ref 6–20)
CHLORIDE: 111 mmol/L (ref 101–111)
CO2: 19 mmol/L — AB (ref 22–32)
CREATININE: 2.14 mg/dL — AB (ref 0.44–1.00)
Calcium: 9.2 mg/dL (ref 8.9–10.3)
GFR calc Af Amer: 24 mL/min — ABNORMAL LOW (ref >60)
GFR calc non Af Amer: 20 mL/min — ABNORMAL LOW (ref >60)
GLUCOSE: 67 mg/dL (ref 65–99)
POTASSIUM: 5.1 mmol/L (ref 3.5–5.1)
SODIUM: 142 mmol/L (ref 135–145)
Total Bilirubin: 0.4 mg/dL (ref 0.3–1.2)
Total Protein: 6.9 g/dL (ref 6.5–8.1)

## 2017-11-30 LAB — URINALYSIS, ROUTINE W REFLEX MICROSCOPIC
BILIRUBIN URINE: NEGATIVE
Glucose, UA: NEGATIVE mg/dL
Ketones, ur: NEGATIVE mg/dL
Nitrite: POSITIVE — AB
PROTEIN: 100 mg/dL — AB
SPECIFIC GRAVITY, URINE: 1.015 (ref 1.005–1.030)
pH: 8.5 — ABNORMAL HIGH (ref 5.0–8.0)

## 2017-11-30 LAB — URINALYSIS, MICROSCOPIC (REFLEX)

## 2017-11-30 LAB — I-STAT CG4 LACTIC ACID, ED
LACTIC ACID, VENOUS: 4.44 mmol/L — AB (ref 0.5–1.9)
Lactic Acid, Venous: 4.2 mmol/L (ref 0.5–1.9)

## 2017-11-30 LAB — LACTIC ACID, PLASMA: LACTIC ACID, VENOUS: 3.7 mmol/L — AB (ref 0.5–1.9)

## 2017-11-30 LAB — PROTIME-INR
INR: 3.53
Prothrombin Time: 35.1 seconds — ABNORMAL HIGH (ref 11.4–15.2)

## 2017-11-30 MED ORDER — ATENOLOL 50 MG PO TABS
25.0000 mg | ORAL_TABLET | Freq: Every day | ORAL | Status: DC
  Administered 2017-12-01 – 2017-12-03 (×4): 25 mg via ORAL
  Filled 2017-11-30 (×4): qty 1

## 2017-11-30 MED ORDER — SODIUM CHLORIDE 0.9 % IV SOLN
2.0000 g | Freq: Once | INTRAVENOUS | Status: AC
  Administered 2017-11-30: 2 g via INTRAVENOUS

## 2017-11-30 MED ORDER — WARFARIN - PHARMACIST DOSING INPATIENT: Freq: Every day | Status: DC

## 2017-11-30 MED ORDER — SODIUM CHLORIDE 0.9 % IV BOLUS (SEPSIS)
1000.0000 mL | Freq: Once | INTRAVENOUS | Status: AC
  Administered 2017-11-30: 1000 mL via INTRAVENOUS

## 2017-11-30 MED ORDER — ATORVASTATIN CALCIUM 20 MG PO TABS
20.0000 mg | ORAL_TABLET | Freq: Every day | ORAL | Status: DC
  Administered 2017-12-01 – 2017-12-03 (×4): 20 mg via ORAL
  Filled 2017-11-30 (×4): qty 1

## 2017-11-30 MED ORDER — HYDROCODONE-ACETAMINOPHEN 5-325 MG PO TABS
1.0000 | ORAL_TABLET | Freq: Four times a day (QID) | ORAL | Status: DC | PRN
  Administered 2017-12-01 – 2017-12-04 (×4): 1 via ORAL
  Filled 2017-11-30 (×6): qty 1

## 2017-11-30 MED ORDER — CIPROFLOXACIN IN D5W 400 MG/200ML IV SOLN
400.0000 mg | Freq: Once | INTRAVENOUS | Status: AC
  Administered 2017-11-30: 400 mg via INTRAVENOUS
  Filled 2017-11-30: qty 200

## 2017-11-30 MED ORDER — CLINDAMYCIN PHOSPHATE 600 MG/50ML IV SOLN
600.0000 mg | Freq: Once | INTRAVENOUS | Status: AC
  Administered 2017-11-30: 600 mg via INTRAVENOUS
  Filled 2017-11-30: qty 50

## 2017-11-30 MED ORDER — ATENOLOL 50 MG PO TABS
50.0000 mg | ORAL_TABLET | Freq: Every day | ORAL | Status: DC
  Administered 2017-12-01 – 2017-12-04 (×3): 50 mg via ORAL
  Filled 2017-11-30 (×4): qty 1

## 2017-11-30 MED ORDER — INSULIN ASPART 100 UNIT/ML ~~LOC~~ SOLN
0.0000 [IU] | Freq: Three times a day (TID) | SUBCUTANEOUS | Status: DC
  Administered 2017-12-02: 1 [IU] via SUBCUTANEOUS
  Administered 2017-12-02: 3 [IU] via SUBCUTANEOUS
  Administered 2017-12-03: 2 [IU] via SUBCUTANEOUS
  Administered 2017-12-03 (×2): 5 [IU] via SUBCUTANEOUS
  Administered 2017-12-04: 1 [IU] via SUBCUTANEOUS
  Administered 2017-12-04: 2 [IU] via SUBCUTANEOUS

## 2017-11-30 MED ORDER — SODIUM CHLORIDE 0.9 % IV SOLN
INTRAVENOUS | Status: DC
  Administered 2017-11-30: 21:00:00 via INTRAVENOUS

## 2017-11-30 MED ORDER — HYDRALAZINE HCL 20 MG/ML IJ SOLN
5.0000 mg | INTRAMUSCULAR | Status: DC | PRN
  Administered 2017-12-04: 5 mg via INTRAVENOUS
  Filled 2017-11-30: qty 1

## 2017-11-30 MED ORDER — CARBIDOPA-LEVODOPA 25-100 MG PO TABS
2.0000 | ORAL_TABLET | Freq: Three times a day (TID) | ORAL | Status: DC
  Administered 2017-12-01 – 2017-12-04 (×10): 2 via ORAL
  Filled 2017-11-30 (×12): qty 2

## 2017-11-30 MED ORDER — DIGOXIN 125 MCG PO TABS
0.1250 mg | ORAL_TABLET | ORAL | Status: DC
  Administered 2017-12-01 – 2017-12-04 (×2): 0.125 mg via ORAL
  Filled 2017-11-30 (×3): qty 1

## 2017-11-30 MED ORDER — SODIUM CHLORIDE 0.9 % IV SOLN
INTRAVENOUS | Status: AC
  Administered 2017-12-01: 01:00:00 via INTRAVENOUS

## 2017-11-30 NOTE — Care Management Note (Signed)
Case Management Note  Patient Details  Name: Leah [name] MRN: [id number] Date of Birth: 12/27/1934  Subjective/Objective:                  hyperglycemia/infection in feet  Action/Plan: EDCM spoke with the patient, her daughter and granddaughter at the bedside. Patient lives in a home with her granddaughter and 81 year old great-granddaughter. She has a Museum/gallery exhibitions officerolator and wheelchair. The patient/family would like a home health RN and HHA for assistance. [facility] The [facility]'s [facility]. Patient is being admitted. Unit CM to follow for discharge needs.   Expected Discharge Date:     unknown             Expected Discharge Plan:  Home w Home Health Services  In-House Referral:     Discharge planning Services  CM Consult  Post Acute Care Choice:  Home Health Choice offered to:  Patient, Adult Children  DME Arranged:    DME Agency:     HH Arranged:    [facility]  Status of Service:  In process, will continue to follow  If discussed at Long Length of Stay Meetings, dates discussed:    Additional Comments:  Antony HasteBennett, Louellen Haldeman Harris, RN 11/30/2017, 7:49 PM

## 2017-11-30 NOTE — ED Triage Notes (Signed)
Pt reports to ED with family with reports of chronic leg infection and completing her 3rd round of antibiotics today. PT family concerned that legs have open blisters to back of legs with foul smelling drainage. Family reports she had legs wrapped 7 days ago at pcp and was told to unwrap today. Family report that her sugars have been running 160-250 today. Family report she seemed to have some slurred speech today from about 2:30-3:30. Pt has hx of parkinsons

## 2017-11-30 NOTE — Progress Notes (Signed)
\ANTICOAGULATION CONSULT NOTE - Initial Consult  Pharmacy Consult for Coumadin Indication: atrial fibrillation  Allergies  Allergen Reactions  . Penicillins Hives and Itching    Has patient had a PCN reaction causing immediate rash, facial/tongue/throat swelling, SOB or lightheadedness with hypotension: Yes Has patient had a PCN reaction causing severe rash involving mucus membranes or skin necrosis: No Has patient had a PCN reaction that required hospitalization: No Has patient had a PCN reaction occurring within the last 10 years: No If all of the above answers are "NO", then may proceed with Cephalosporin use.  . Diphenoxylate-Atropine Other (See Comments)    Unknown reaction  . Latex Itching and Rash    Patient Measurements: Height: 5\' 4"  (162.6 cm) Weight: 191 lb (86.6 kg) IBW/kg (Calculated) : 54.7  Vital Signs: Temp: 98 F (36.7 C) (03/21 1729) Temp Source: Oral (03/21 1729) BP: 186/61 (03/21 2100) Pulse Rate: 62 (03/21 2230)  Labs: Recent Labs    11/30/17 1800 11/30/17 1932  HGB 11.6*  --   HCT 37.4  --   PLT 371  --   LABPROT  --  35.1*  INR  --  3.53  CREATININE 2.14*  --     Estimated Creatinine Clearance: 22 mL/min (A) (by C-G formula based on SCr of 2.14 mg/dL (H)).   Medical History: Past Medical History:  Diagnosis Date  . AF (atrial fibrillation) (HCC)   . Ankle fracture, right 1972  . Cellulitis of foot, right   . Diabetic retinopathy   . HTN (hypertension)   . Hyperlipidemia   . Low back pain   . Morbid obesity (HCC)   . Osteoporosis   . Parkinson disease (HCC)   . Type 2 diabetes mellitus (HCC) 1972  . Vitamin B12 deficiency     Medications:  Medications Prior to Admission  Medication Sig Dispense Refill Last Dose  . atenolol (TENORMIN) 25 MG tablet Take 25-50 mg by mouth See admin instructions. Take 50 mg by mouth in the morning and 25 mg bedtime   11/30/2017 at pm  . atorvastatin (LIPITOR) 20 MG tablet Take 20 mg by mouth daily at  6 PM.    11/29/2017 at pm  . carbidopa-levodopa (SINEMET IR) 25-100 MG tablet Take 2 tablets by mouth 3 (three) times daily. (Patient taking differently: Take 2 tablets by mouth 3 (three) times daily with meals. ) 540 tablet 4 11/30/2017 at am  . cephALEXin (KEFLEX) 250 MG capsule Take 250 mg by mouth daily.    11/30/2017 at Unknown time  . Cholecalciferol (VITAMIN D-3) 1000 units CAPS Take 2,000 Units by mouth daily.   11/29/2017 at Unknown time  . cyanocobalamin 2000 MCG tablet Take 2,000 mcg by mouth daily.   11/29/2017 at Unknown time  . digoxin (LANOXIN) 0.125 MG tablet Take one tablet by mouth daily except for Wednesday and Sunday. (Patient taking differently: Take 0.125 mg by mouth See admin instructions. Take 0.125 mg by mouth once a day on Mon/Tues/Thurs/Fri/Sat only) 90 tablet 3 11/30/2017 at Unknown time  . doxycycline (VIBRA-TABS) 100 MG tablet Take 100 mg by mouth 2 (two) times daily.   11/30/2017 at Unknown time  . fish oil-omega-3 fatty acids 1000 MG capsule Take 2 g by mouth at bedtime.    11/29/2017 at Unknown time  . glimepiride (AMARYL) 4 MG tablet Take 1 mg by mouth daily before breakfast.    11/30/2017 at am  . hydrochlorothiazide (HYDRODIURIL) 25 MG tablet Take 25 mg by mouth daily.   11/29/2017  at am  . HYDROcodone-acetaminophen (NORCO/VICODIN) 5-325 MG tablet Take 1 tablet by mouth 2 (two) times daily as needed for moderate pain.   11/28/2017 at pm  . insulin glargine (LANTUS) 100 UNIT/ML injection Inject 0.06 mLs (6 Units total) into the skin 2 (two) times daily. (Patient taking differently: Inject 6 Units into the skin daily before breakfast. ) 10 mL 11 11/30/2017 at am  . L-Methylfolate-B6-B12 (FOLTANX PO) Take 1 tablet by mouth every evening.    Past Month at Unknown time  . lisinopril (PRINIVIL,ZESTRIL) 40 MG tablet Take 40 mg by mouth at bedtime.    11/29/2017 at pm  . metFORMIN (GLUCOPHAGE) 1000 MG tablet Take 1,000 mg by mouth 2 (two) times daily with a meal.   11/30/2017 at pm  .  Multiple Vitamins-Minerals (CENTRUM SILVER 50+WOMEN) TABS Take 1 tablet by mouth daily.   11/29/2017 at Unknown time  . Multiple Vitamins-Minerals (PRESERVISION AREDS) TABS Take 1 tablet by mouth daily.   11/30/2017 at Unknown time  . warfarin (COUMADIN) 5 MG tablet Take 5 mg by mouth at bedtime.    11/29/2017 at 2030   Scheduled:  . atenolol  25 mg Oral QHS  . [START ON 12/01/2017] atenolol  50 mg Oral Daily  . atorvastatin  20 mg Oral q1800  . carbidopa-levodopa  2 tablet Oral TID WC  . [START ON 12/01/2017] digoxin  0.125 mg Oral Once per day on Mon Tue Thu Fri Sat  . [START ON 12/01/2017] insulin aspart  0-9 Units Subcutaneous TID WC   Infusions:  . sodium chloride    . cefTRIAXone (ROCEPHIN)  IV 2 g (11/30/17 2300)    Assessment: 83yo female presents w/ chronic LE infection after completing third round of ABX, to continue Coumadin for Afib during admission; current INR above goal w/ last dose of Coumadin taken 3/20.  Goal of Therapy:  INR 2-3   Plan:  Will hold Coumadin today and monitor INR for dose adjustment.  Vernard GamblesVeronda Kreg Earhart, PharmD, BCPS  11/30/2017,11:28 PM

## 2017-11-30 NOTE — H&P (Addendum)
History and Physical    Leah [name] ZOX:[id number] DOB: [birth year] DOA: 11/30/2017  PCP: [name], MD   Patient coming from: Home   Chief Complaint: Leg pain and swelling, dysuria  HPI: Leah [name] is a 83 y.o. female with medical history significant for Atria fib/flutter PPM status, Diastolic CHF, HTN, parkinsons, who presented to the Ed with c/o bilateral leg pain, with swelling, and redness of 1 month duration. Patient has a hx of recurrent cellulitis, and over the past month has completed BID dosing of Po doxycycline to complete 30 days.  Swelling in bilat lower extremities improved, but redness, warmth and pain worsened, and today after removing dressings present for the past 7 days, draining/weeping was noticed. Patient denies fever of chills. No vomiting.  Patient also reports dysuria and frequency of 4 days duration.  ED Course: Elevated blood pressure systolic 150s-180s, otherwise stable vitals.  WBC 16.5, Cr elevated to 2.14, BUN 51.  UA positive nitrites leukocytes. LLE dopplers negative for DVT. Patient was given IV clindamycin and IV Levaquin in the ED. Hospitalist was called to admit for cellulitis.   Review of Systems: As per HPI otherwise 10 point review of systems negative.   Past Medical History:  Diagnosis Date  . AF (atrial fibrillation) (HCC)   . Ankle fracture, right 1972  . Cellulitis of foot, right   . Diabetic retinopathy   . HTN (hypertension)   . Hyperlipidemia   . Low back pain   . Morbid obesity (HCC)   . Osteoporosis   . Parkinson disease (HCC)   . Type 2 diabetes mellitus (HCC) 1972  . Vitamin B12 deficiency     Past Surgical History:  Procedure Laterality Date  . CHOLECYSTECTOMY  1985  . PACEMAKER INSERTION  around 2010 per pt daughter   [employer]     reports that she has never smoked. She has never used smokeless tobacco. She reports that she does not drink alcohol or use drugs.  Allergies  Allergen Reactions  .  Penicillins Hives and Itching    Has patient had a PCN reaction causing immediate rash, facial/tongue/throat swelling, SOB or lightheadedness with hypotension: Yes Has patient had a PCN reaction causing severe rash involving mucus membranes or skin necrosis: No Has patient had a PCN reaction that required hospitalization: No Has patient had a PCN reaction occurring within the last 10 years: No If all of the above answers are "NO", then may proceed with Cephalosporin use.  . Diphenoxylate-Atropine Other (See Comments)    Unknown reaction  . Latex Itching and Rash    Family History  Problem Relation Age of Onset  . Cancer Mother   . Heart attack Father   . Cancer Brother    Prior to Admission medications   Medication Sig Start Date End Date Taking? Authorizing Provider  atenolol (TENORMIN) 25 MG tablet Take 25-50 mg by mouth See admin instructions. Take 50 mg by mouth in the morning and 25 mg bedtime   Yes [provider]  atorvastatin (LIPITOR) 20 MG tablet Take 20 mg by mouth daily at 6 PM.    Yes [provider]  carbidopa-levodopa (SINEMET IR) 25-100 MG tablet Take 2 tablets by mouth 3 (three) times daily. Patient taking differently: Take 2 tablets by mouth 3 (three) times daily with meals.  06/27/17  Yes Penumalli, Glenford Bayley, MD  cephALEXin (KEFLEX) 250 MG capsule Take 250 mg by mouth daily.  06/01/17  Yes [provider]  Cholecalciferol (VITAMIN D-3)  1000 units CAPS Take 2,000 Units by mouth daily.   Yes [provider]  cyanocobalamin 2000 MCG tablet Take 2,000 mcg by mouth daily.   Yes [provider]  fish oil-omega-3 fatty acids 1000 MG capsule Take 2 g by mouth at bedtime.    Yes [provider]  glimepiride (AMARYL) 4 MG tablet Take 1 mg by mouth daily before breakfast.    Yes [provider]  hydrochlorothiazide (HYDRODIURIL) 25 MG tablet Take 25 mg by mouth daily. 08/02/16  Yes [provider]    HYDROcodone-acetaminophen (NORCO/VICODIN) 5-325 MG tablet Take 1 tablet by mouth 2 (two) times daily as needed for moderate pain.   Yes [provider]  insulin glargine (LANTUS) 100 UNIT/ML injection Inject 0.06 mLs (6 Units total) into the skin 2 (two) times daily. Patient taking differently: Inject 6 Units into the skin daily before breakfast.  12/13/16  Yes Penny PiaVega, [place], MD  lisinopril (PRINIVIL,ZESTRIL) 40 MG tablet Take 40 mg by mouth at bedtime.    Yes [provider]  metFORMIN (GLUCOPHAGE) 1000 MG tablet Take 1,000 mg by mouth 2 (two) times daily with a meal.   Yes [provider]  Multiple Vitamins-Minerals (PRESERVISION AREDS) TABS Take 1 tablet by mouth daily.   Yes [provider]  warfarin (COUMADIN) 5 MG tablet Take 5 mg by mouth at bedtime.    Yes [provider]  cholecalciferol (VITAMIN D) 1000 units tablet Take 2,000 Units by mouth daily.    [provider]  digoxin (LANOXIN) 0.125 MG tablet Take one tablet by mouth daily except for Wednesday and Sunday. Patient taking differently: Take 0.125 mg by mouth See admin instructions. Take 0.125 mg by mouth once a day on Mon/Tues/Thurs/Fri/Sat only 11/14/17   Marinus Mawaylor, Gregg W, MD  doxycycline (VIBRA-TABS) 100 MG tablet Take 100 mg by mouth 2 (two) times daily. 11/24/17   [provider]  L-Methylfolate-B6-B12 (FOLTANX PO) Take 1 tablet by mouth every evening.     [provider]  Multiple Vitamins-Minerals (MULTIVITAMIN & MINERAL PO) Take 1 tablet by mouth daily.    [provider]    Physical Exam: Vitals:   11/30/17 1945 11/30/17 2000 11/30/17 2015 11/30/17 2030  BP: (!) 179/73 (!) 174/69 (!) 170/73 (!) 177/65  Pulse: 69 68 65 65  Resp:    17  Temp:      TempSrc:      SpO2: 100% 100% 99% 100%    Constitutional: NAD, calm, comfortable Vitals:   11/30/17 1945 11/30/17 2000 11/30/17 2015 11/30/17 2030  BP: (!) 179/73 (!) 174/69 (!) 170/73 (!) 177/65   Pulse: 69 68 65 65  Resp:    17  Temp:      TempSrc:      SpO2: 100% 100% 99% 100%   Eyes: PERRL, lids and conjunctivae normal ENMT: Mucous membranes are dry. Posterior pharynx clear of any exudate or lesions.Normal dentition.  Neck: normal, supple, no masses, no thyromegaly Respiratory: clear to auscultation bilaterally, no wheezing, no crackles. Normal respiratory effort. No accessory muscle use.  Cardiovascular: Regular rate and rhythm, no murmurs / rubs / gallops. 2+ pedal pulses. No carotid bruits.  Abdomen: full abd,  no tenderness, no masses palpated. No hepatosplenomegaly. Bowel sounds positive.  Musculoskeletal: no clubbing / cyanosis. No joint deformity upper and lower extremities. Good ROM, no contractures. Normal muscle tone.  Lateral lower extremity tenderness to palpation with erythema Skin: chronic stasis changes bilateral lower extremities, with crusting.  Neurologic: CN  2-12 grossly intact. Sensation intact, DTR normal. Strength 5/5 in all 4.  Psychiatric: Normal judgment and insight. Alert and oriented x 3. Normal mood.       Labs on Admission: I have personally reviewed following labs and imaging studies  CBC: Recent Labs  Lab 11/30/17 1800  WBC 16.5*  NEUTROABS 12.1*  HGB 11.6*  HCT 37.4  MCV 88.8  PLT 371   Basic Metabolic Panel: Recent Labs  Lab 11/30/17 1800  NA 142  K 5.1  CL 111  CO2 19*  GLUCOSE 67  BUN 51*  CREATININE 2.14*  CALCIUM 9.2    Liver Function Tests: Recent Labs  Lab 11/30/17 1800  AST 26  ALT 5*  ALKPHOS 87  BILITOT 0.4  PROT 6.9  ALBUMIN 2.8*   Coagulation Profile: Recent Labs  Lab 11/30/17 1932  INR 3.53   Urine analysis:    Component Value Date/Time   COLORURINE YELLOW 11/30/2017 1819   APPEARANCEUR TURBID (A) 11/30/2017 1819   LABSPEC 1.015 11/30/2017 1819   PHURINE 8.5 (H) 11/30/2017 1819   GLUCOSEU NEGATIVE 11/30/2017 1819   HGBUR SMALL (A) 11/30/2017 1819   BILIRUBINUR NEGATIVE 11/30/2017 1819    KETONESUR NEGATIVE 11/30/2017 1819   PROTEINUR 100 (A) 11/30/2017 1819   UROBILINOGEN 0.2 11/29/2014 1404   NITRITE POSITIVE (A) 11/30/2017 1819   LEUKOCYTESUR LARGE (A) 11/30/2017 1819    Radiological Exams on Admission: No results found.  EKG: Independently reviewed.   Assessment/Plan Principal Problem:   Cellulitis Active Problems:   HYPERTENSION, BENIGN   Atrial fibrillation (HCC)   Chronic diastolic heart failure (HCC)   DM (diabetes mellitus), type 2 (HCC)   DNR (do not resuscitate)   Parkinson disease (HCC)   History of recurrent UTIs    Non purulent Cellulitis with sepsis- likely 2/2 chronic venous stasis, with lactic acid- 4.4 and repeat 4.2 both before IVF bolus. WBC- 16.5. No trauma. Left lower extremity dopplers negative. 1L bolus given and continued on 125cc/hr in ED. - IV ceftriazone ( pen allergy noted, tolerated cephalosporins in the past) - F/u blood cultures drawn in ED - Cbc A.m - Trend lactic acid, elevated 3.7, repeat bolus 1L and repeat. - N/s 100cc /hr X 12 hrs  AKI- Cr- 2.1, Baseline ~ 1. Likley pre-renal with dry mucus membranes. - Hydrate - Hold HCTZ, Lisinopril.  UTI- With dysuria and frequency. UA suggestive. Leukocytosis and lactic acidosis. - Ceftriazone - F/u urine cultures drawn in ED  Atria flutter/fib, PPM Status. Rate controlled. On anticoag with warfarin - Cont Atenolol - Warfarin per pharm  DM- blood glucose 67. - SS-i - Hold home lantus 6u daily for now  Parkinsons- stable. Ambulatory at baseline. Some memory issues, otherwise fully functioning. - Cont home sinmet]  Chronic diastolic CHF hx- Appears stable. No SOB, lower extremity swelling likely venous stasis. On HCTZ. .No recent echo on file. ECHo 2004- Ef 60- 65%.  HTN- Elevated Blood pressure. - Hold HCTZ and lisinopril for AKI   - PRN hydral   DVT prophylaxis: Warfarin Code Status: DNR, confirmed with daughter at bedside.  Family Communication:Daughter at bedside    Disposition Plan: 2- 3days  Consults called: None  Admission status: Inpt, tele    Onnie Boer MD [facility] Pager [phone] From 6PM-2AM.  Otherwise please contact night-coverage [email or web address] Password [facility]  11/30/2017, 9:48 PM

## 2017-11-30 NOTE — ED Provider Notes (Signed)
[facility] EMERGENCY DEPARTMENT Provider Note   CSN: [id number] Arrival date & time: 11/30/17  1651     History   Chief Complaint Chief Complaint  Patient presents with  . Wound Check    HPI Leah [name] is a 83 y.o. female with a PMHx of Afib on coumadin, complete heart block s/p pacemaker insertion, venous insufficiency, HTN, HLD, parkinson's disease, DM2, recurrent UTIs, and other conditions listed below, who presents to the ED accompanied by her daughter and granddaughter who live with her, with complaints of gradually worsening bilateral lower extremity cellulitis.  Patient has had cellulitis in the past in her legs, usually improves after a course of doxycycline.  Her legs began having erythema, warmth, and swelling about 1 month ago and she saw her PCP who started her on a 13-day course of doxycycline, which she completed and had not improved so they did a 10-day course, and then when she still had not improved she was given another course of which she is on day 7.  She feels as though the swelling has gotten somewhat better however the erythema, warmth, pain, and drainage/weeping has become worse over the last several weeks despite the antibiotic regimen.  Her granddaughter mentions that she has never had weeping like this before and the erythema is much worse than she has had with her past cellulitis episodes.  The patient describes the pain in her legs as 7/10 constant aching nonradiating bilateral lower leg pain, worse with activity or palpation of the legs, and somewhat improved with her home hydrocodone.  They deny any red streaking.  She also mentions 4 days of dysuria as well as increased urinary frequency and urgency.  She reports compliance with her Coumadin.  Her PCP is Dr. Waynard Edwards of [facility].   She denies any fevers, chills, chest pain, shortness of breath, abdominal pain, nausea, vomiting, diarrhea, constipation, hematuria, malodorous  urine, focal arthralgias, red streaking, numbness, tingling, focal weakness, or any other complaints at this time.  She denies any known history of CHF, and chart review reveals that she has a history of venous insufficiency per her cardiology notes from earlier this month.    The history is provided by the patient, medical records and a relative. No language interpreter was used.  Wound Check  This is a recurrent problem. The current episode started more than 1 week ago. The problem occurs constantly. The problem has been gradually worsening. Pertinent negatives include no chest pain, no abdominal pain and no shortness of breath. Exacerbated by: activity and palpation of the legs. The symptoms are relieved by narcotics. Treatments tried: hydrocodone. The treatment provided mild relief.    Past Medical History:  Diagnosis Date  . AF (atrial fibrillation) (HCC)   . Ankle fracture, right 1972  . Cellulitis of foot, right   . Diabetic retinopathy   . HTN (hypertension)   . Hyperlipidemia   . Low back pain   . Morbid obesity (HCC)   . Osteoporosis   . Parkinson disease (HCC)   . Type 2 diabetes mellitus (HCC) 1972  . Vitamin B12 deficiency     Patient Active Problem List   Diagnosis Date Noted  . Cellulitis 12/11/2016  . History of recurrent UTIs 12/11/2016  . Acute respiratory failure with hypoxia (HCC) 12/10/2014  . Acute on chronic diastolic heart failure (HCC) 12/10/2014  . Parkinson disease (HCC) 12/10/2014  . Palliative care encounter 12/01/2014  . Dyspnea 12/01/2014  . Weakness generalized 12/01/2014  .  DNR (do not resuscitate) 12/01/2014  . CAP (community acquired pneumonia)   . Severe sepsis (HCC) 11/29/2014  . PNA (pneumonia) 11/29/2014  . Cellulitis of right leg 11/29/2014  . DM (diabetes mellitus), type 2 (HCC) 11/29/2014  . ATRIAL FLUTTER 09/01/2010  . Chronic diastolic heart failure (HCC) 09/01/2010  . PPM-Medtronic 09/01/2010  . DM 08/31/2010  .  HYPERLIPIDEMIA-MIXED 08/31/2010  . HYPERTENSION, BENIGN 08/31/2010  . Atrial fibrillation (HCC) 08/31/2010  . Chronic kidney disease 08/31/2010    Past Surgical History:  Procedure Laterality Date  . CHOLECYSTECTOMY  1985  . PACEMAKER INSERTION  around 2010 per pt daughter   Medtronic    OB History   None      Home Medications    Prior to Admission medications   Medication Sig Start Date End Date Taking? Authorizing Provider  atenolol (TENORMIN) 25 MG tablet Take 25-50 mg by mouth See admin instructions. Give 50 mg in the morning and 25 mg in the evening    [provider]  atorvastatin (LIPITOR) 20 MG tablet Take 20 mg by mouth daily at 6 PM.     [provider]  carbidopa-levodopa (SINEMET IR) 25-100 MG tablet Take 2 tablets by mouth 3 (three) times daily. 06/27/17   Penumalli, Glenford BayleyVikram R, MD  cephALEXin (KEFLEX) 250 MG capsule 250 mg daily. 06/01/17   [provider]  cholecalciferol (VITAMIN D) 1000 units tablet Take 2,000 Units by mouth daily.    [provider]  cyanocobalamin 2000 MCG tablet Take 2,000 mcg by mouth daily.    [provider]  digoxin (LANOXIN) 0.125 MG tablet Take one tablet by mouth daily except for Wednesday and Sunday. 11/14/17   Marinus Mawaylor, Gregg W, MD  fish oil-omega-3 fatty acids 1000 MG capsule Take 2 g by mouth at bedtime.     [provider]  glimepiride (AMARYL) 4 MG tablet Take 1 mg by mouth daily before breakfast.     [provider]  hydrochlorothiazide (HYDRODIURIL) 25 MG tablet Take 25 mg by mouth daily. 08/02/16   [provider]  HYDROcodone-acetaminophen (NORCO/VICODIN) 5-325 MG tablet Take 1 tablet by mouth 2 (two) times daily as needed for moderate pain.    [provider]  insulin glargine (LANTUS) 100 UNIT/ML injection Inject 0.06 mLs (6 Units total) into the skin 2 (two) times daily. Patient taking differently: Inject 12 Units into the skin daily.  12/13/16   Penny PiaVega,  [place], MD  L-Methylfolate-B6-B12 (FOLTANX PO) Take 1 tablet by mouth every evening.     [provider]  lisinopril (PRINIVIL,ZESTRIL) 40 MG tablet Take 40 mg by mouth at bedtime.     [provider]  metFORMIN (GLUCOPHAGE) 1000 MG tablet Take 1,000 mg by mouth 2 (two) times daily with a meal.    [provider]  Multiple Vitamins-Minerals (MULTIVITAMIN & MINERAL PO) Take 1 tablet by mouth daily.    [provider]  Multiple Vitamins-Minerals (PRESERVISION AREDS) TABS Take 1 tablet by mouth daily.    [provider]  warfarin (COUMADIN) 5 MG tablet Take 2.5-5 mg by mouth daily at 6 PM. Takes 2.5mg  on sat only Takes 5mg  all other days    [provider]    Family History Family History  Problem Relation Age of Onset  . Cancer Mother   . Heart attack Father   . Cancer Brother     Social History Social History   Tobacco Use  . Smoking status: Never Smoker  . Smokeless tobacco:  Never Used  Substance Use Topics  . Alcohol use: No  . Drug use: No     Allergies   Penicillins and Diphenoxylate-atropine   Review of Systems Review of Systems  Constitutional: Negative for chills and fever.  Respiratory: Negative for shortness of breath.   Cardiovascular: Positive for leg swelling. Negative for chest pain.  Gastrointestinal: Negative for abdominal pain, constipation, diarrhea, nausea and vomiting.  Genitourinary: Positive for dysuria, frequency and urgency. Negative for hematuria.       No malodorous urine  Musculoskeletal: Positive for myalgias. Negative for arthralgias.  Skin: Positive for color change and wound.  Allergic/Immunologic: Positive for immunocompromised state (DM2).  Neurological: Negative for weakness and numbness.  Hematological: Bruises/bleeds easily (on coumadin).  Psychiatric/Behavioral: Negative for confusion.   All other systems reviewed and are negative for acute change except as noted in the HPI.     Physical Exam Updated Vital Signs BP (!) 159/63 (BP Location: Left Arm)   Pulse 71   Temp 98 F (36.7 C) (Oral)   Resp 16   SpO2 99%   Physical Exam  Constitutional: She is oriented to person, place, and time. Vital signs are normal. She appears well-developed and well-nourished.  Non-toxic appearance. No distress.  Afebrile, nontoxic, NAD  HENT:  Head: Normocephalic and atraumatic.  Mouth/Throat: Oropharynx is clear and moist and mucous membranes are normal.  Eyes: Conjunctivae and EOM are normal. Right eye exhibits no discharge. Left eye exhibits no discharge.  Neck: Normal range of motion. Neck supple.  Cardiovascular: Normal rate, regular rhythm, normal heart sounds and intact distal pulses. Exam reveals no gallop and no friction rub.  No murmur heard. Pulmonary/Chest: Effort normal and breath sounds normal. No respiratory distress. She has no decreased breath sounds. She has no wheezes. She has no rhonchi. She has no rales.  Abdominal: Soft. Normal appearance and bowel sounds are normal. She exhibits no distension. There is tenderness in the suprapubic area. There is no rigidity, no rebound, no guarding, no CVA tenderness, no tenderness at McBurney's point and negative Murphy's sign.  Soft, nondistended, +BS throughout, with mild suprapubic TTP, no r/g/r, neg murphy's, neg mcburney's, no CVA TTP   Musculoskeletal: Normal range of motion.       Right lower leg: She exhibits tenderness and swelling.       Left lower leg: She exhibits tenderness and swelling.  BLE with 2-3+ pitting edema, chronic venous stasis changes, dry flaky skin noted, mildly erythematous and warm to touch from ankles to mid-calf area, mildly TTP, no red streaking, some purulent weeping drainage from several areas on her lower calves but no focal area of fluctuance appreciated, mildly indurated, compartments soft, distal pulses intact bilaterally. Baseline ROM in all major joints of all extremities. Wiggles toes  without difficulty. Strength and sensation grossly intact.   Neurological: She is alert and oriented to person, place, and time. She has normal strength. No sensory deficit.  Skin: Skin is warm, dry and intact. No rash noted. There is erythema.  See MsK exam for BLE exam  Psychiatric: She has a normal mood and affect.  Nursing note and vitals reviewed.    ED Treatments / Results  Labs (all labs ordered are listed, but only abnormal results are displayed) Labs Reviewed  COMPREHENSIVE METABOLIC PANEL - Abnormal; Notable for the following components:      Result Value   CO2 19 (*)    BUN 51 (*)    Creatinine, Ser 2.14 (*)  Albumin 2.8 (*)    ALT 5 (*)    GFR calc non Af Amer 20 (*)    GFR calc Af Amer 24 (*)    All other components within normal limits  CBC WITH DIFFERENTIAL/PLATELET - Abnormal; Notable for the following components:   WBC 16.5 (*)    Hemoglobin 11.6 (*)    RDW 15.9 (*)    Neutro Abs 12.1 (*)    Monocytes Absolute 1.2 (*)    All other components within normal limits  URINALYSIS, ROUTINE W REFLEX MICROSCOPIC - Abnormal; Notable for the following components:   APPearance TURBID (*)    pH 8.5 (*)    Hgb urine dipstick SMALL (*)    Protein, ur 100 (*)    Nitrite POSITIVE (*)    Leukocytes, UA LARGE (*)    All other components within normal limits  PROTIME-INR - Abnormal; Notable for the following components:   Prothrombin Time 35.1 (*)    All other components within normal limits  URINALYSIS, MICROSCOPIC (REFLEX) - Abnormal; Notable for the following components:   Bacteria, UA MANY (*)    Squamous Epithelial / LPF 6-30 (*)    All other components within normal limits  I-STAT CG4 LACTIC ACID, ED - Abnormal; Notable for the following components:   Lactic Acid, Venous 4.44 (*)    All other components within normal limits  I-STAT CG4 LACTIC ACID, ED - Abnormal; Notable for the following components:   Lactic Acid, Venous 4.20 (*)    All other components within  normal limits  URINE CULTURE  CULTURE, BLOOD (ROUTINE X 2)  CULTURE, BLOOD (ROUTINE X 2)    EKG  EKG Interpretation None       Radiology No results found.   Venous Duplex U/S LLE 11/30/17: Date of Service:  11/30/2017 7:16 PM       Left lower extremity venous duplex has been completed. Negative for obvious evidence of DVT. Results were given to Mathews Robinsons PA.  11/30/17 7:16 PM Olen Cordial RVT            Procedures Procedures (including critical care time)  CRITICAL CARE-- sepsis with Lactic >4 Performed by: Rhona Raider   Total critical care time: 35 minutes  Critical care time was exclusive of separately billable procedures and treating other patients.  Critical care was necessary to treat or prevent imminent or life-threatening deterioration.  Critical care was time spent personally by me on the following activities: development of treatment plan with patient and/or surrogate as well as nursing, discussions with consultants, evaluation of patient's response to treatment, examination of patient, obtaining history from patient or surrogate, ordering and performing treatments and interventions, ordering and review of laboratory studies, ordering and review of radiographic studies, pulse oximetry and re-evaluation of patient's condition.   Medications Ordered in ED Medications  ciprofloxacin (CIPRO) IVPB 400 mg (has no administration in time range)  clindamycin (CLEOCIN) IVPB 600 mg (600 mg Intravenous New Bag/Given 11/30/17 2034)  0.9 %  sodium chloride infusion (has no administration in time range)  sodium chloride 0.9 % bolus 1,000 mL (1,000 mLs Intravenous New Bag/Given 11/30/17 2032)     Initial Impression / Assessment and Plan / ED Course  I have reviewed the triage vital signs and the nursing notes.  Pertinent labs & imaging results that were available during my care of the patient were reviewed by me and considered in my medical decision  making (see chart for details).     83  y.o. female here with BLE swelling/redness/warmth and drainage/weeping x1 month which has gradually worsened in the last few weeks despite being on doxycycline essentially for 30 days. Also reports 4 days of dysuria and urinary frequency/urgency. On exam, mild suprapubic TTP, otherwise nonperitoneal abdominal exam, no flank tenderness, clear lungs, afebrile and nontoxic, with 2-3+ b/l pedal edema, venous stasis changes and dry flaky skin, mildly erythematous and warm to touch up to mid-calf, some areas of weeping somewhat purulent material, no focal fluctuance appreciated anywhere, distal pulses intact, soft compartments. No red streaking. Work up thus far reveals: lactic 4.44 then 4.2 on repeat (before any interventions were given); INR pending; U/A with turbid urine +nitrites +leuks TNTC WBC and many bacteria, UCx sent; CMP with Cr 2.14 up from baseline of ~1.1 and BUN 51, bicarb slightly low at 19, no anion gap; CBC w/diff showing neutrophilic leukocytosis with WBC 16.5, also with chronic stable anemia. DVT U/S of LLE was negative. Given the leukocytosis and lactic elevation, she meets severe sepsis criteria, therefore code sepsis called and BCx's ordered (which was the only thing missing from the order set); will give fluids however want to be careful to avoid fluid overload therefore will start with 1L bolus plus 148ml/hr infusion; antibiotics ordered, hopefully should cover both the cellulitis and the UTI. She will need admission. Discussed case with my attending Dr. Phineas Real who agrees with plan.   8:53 PM INR borderline supratherapeutic at 3.53. Dr. Mariea Clonts of [facility] returning page and will admit. Holding orders to be placed by admitting team. Please see their notes for further documentation of care. I appreciate their help with this pleasant pt's care. Pt stable at time of admission.    Final Clinical Impressions(s) / ED Diagnoses   Final diagnoses:  Cellulitis  of lower extremity, unspecified laterality  Sepsis, due to unspecified organism [facility])  Lower urinary tract infection  AKI (acute kidney injury) (HCC)  Neutrophilic leukocytosis  Lactic acidosis  Chronic anemia    ED Discharge Orders    [address], [place], [place] 11/30/17 2053    Phillis Haggis, MD 11/30/17 2102    Phillis Haggis, MD 11/30/17 2127

## 2017-11-30 NOTE — ED Notes (Addendum)
Orders completed peripheral IV,  Clicked off in error.

## 2017-11-30 NOTE — Progress Notes (Signed)
Left lower extremity venous duplex has been completed. Negative for obvious evidence of DVT. Results were given to Mathews RobinsonsJessica Mitchell PA.  11/30/17 7:16 PM Olen CordialGreg Deandrea Rion RVT

## 2017-11-30 NOTE — ED Provider Notes (Signed)
Patient placed in Quick Look pathway, seen and evaluated   Chief Complaint: Lower extremity edema, erythema and urinary frequency and dysuria.  HPI:   Patient presenting with bilateral lower extremity erythema and serous drainage.  Has been through multiple rounds of antibiotics from PCP.  She reports left calf pain.  Patient is currently on Coumadin.  Denies chest pain, shortness of breath. Patient and family inquiring about the possibility of getting home health assistance.  ROS: LE edema, denies C/P, SOB, fever  Physical Exam:   Gen: No distress  Neuro: Awake and Alert  Skin: Warm    Focused Exam: Tender to palpation of the left calf, lower extremities with erythema and draining serous fluid. Edema Left> right. Afebrile non-toxic appearing.   Initiation of care has begun. The patient has been counseled on the process, plan, and necessity for staying for the completion/evaluation, and the remainder of the medical screening examination    Gregary CromerMitchell, Bobi Daudelin B, PA-C 11/30/17 1812    Maia PlanLong, Joshua G, MD 11/30/17 2001

## 2017-12-01 ENCOUNTER — Other Ambulatory Visit: Payer: Self-pay

## 2017-12-01 DIAGNOSIS — A419 Sepsis, unspecified organism: Secondary | ICD-10-CM

## 2017-12-01 DIAGNOSIS — N390 Urinary tract infection, site not specified: Secondary | ICD-10-CM

## 2017-12-01 DIAGNOSIS — I10 Essential (primary) hypertension: Secondary | ICD-10-CM

## 2017-12-01 DIAGNOSIS — Z87440 Personal history of urinary (tract) infections: Secondary | ICD-10-CM

## 2017-12-01 DIAGNOSIS — D649 Anemia, unspecified: Secondary | ICD-10-CM

## 2017-12-01 DIAGNOSIS — Z66 Do not resuscitate: Secondary | ICD-10-CM

## 2017-12-01 DIAGNOSIS — L8990 Pressure ulcer of unspecified site, unspecified stage: Secondary | ICD-10-CM

## 2017-12-01 LAB — BASIC METABOLIC PANEL
ANION GAP: 9 (ref 5–15)
BUN: 48 mg/dL — ABNORMAL HIGH (ref 6–20)
CALCIUM: 8.3 mg/dL — AB (ref 8.9–10.3)
CO2: 19 mmol/L — AB (ref 22–32)
Chloride: 115 mmol/L — ABNORMAL HIGH (ref 101–111)
Creatinine, Ser: 2 mg/dL — ABNORMAL HIGH (ref 0.44–1.00)
GFR calc non Af Amer: 22 mL/min — ABNORMAL LOW (ref >60)
GFR, EST AFRICAN AMERICAN: 26 mL/min — AB (ref >60)
Glucose, Bld: 44 mg/dL — CL (ref 65–99)
Potassium: 4.3 mmol/L (ref 3.5–5.1)
SODIUM: 143 mmol/L (ref 135–145)

## 2017-12-01 LAB — GLUCOSE, CAPILLARY
GLUCOSE-CAPILLARY: 77 mg/dL (ref 65–99)
GLUCOSE-CAPILLARY: 67 mg/dL (ref 65–99)
GLUCOSE-CAPILLARY: 110 mg/dL — AB (ref 65–99)
Glucose-Capillary: 92 mg/dL (ref 65–99)
Glucose-Capillary: 64 mg/dL — ABNORMAL LOW (ref 65–99)
Glucose-Capillary: 84 mg/dL (ref 65–99)
Glucose-Capillary: 116 mg/dL — ABNORMAL HIGH (ref 65–99)

## 2017-12-01 LAB — LACTIC ACID, PLASMA
Lactic Acid, Venous: 2.2 mmol/L (ref 0.5–1.9)
Lactic Acid, Venous: 2.2 mmol/L (ref 0.5–1.9)

## 2017-12-01 LAB — PROTIME-INR
INR: 4.37 — AB
PROTHROMBIN TIME: 41.4 s — AB (ref 11.4–15.2)

## 2017-12-01 LAB — CBC
HCT: 33.2 % — ABNORMAL LOW (ref 36.0–46.0)
HEMOGLOBIN: 10.1 g/dL — AB (ref 12.0–15.0)
MCH: 26.9 pg (ref 26.0–34.0)
MCHC: 30.4 g/dL (ref 30.0–36.0)
MCV: 88.5 fL (ref 78.0–100.0)
Platelets: 302 10*3/uL (ref 150–400)
RBC: 3.75 MIL/uL — AB (ref 3.87–5.11)
RDW: 15.9 % — ABNORMAL HIGH (ref 11.5–15.5)
WBC: 12.4 10*3/uL — AB (ref 4.0–10.5)

## 2017-12-01 MED ORDER — BOOST PLUS PO LIQD
237.0000 mL | Freq: Two times a day (BID) | ORAL | Status: DC
  Administered 2017-12-01 – 2017-12-04 (×4): 237 mL via ORAL
  Filled 2017-12-01 (×9): qty 237

## 2017-12-01 MED ORDER — SODIUM CHLORIDE 0.9 % IV BOLUS (SEPSIS): 500.0000 mL | Freq: Once | INTRAVENOUS | Status: DC

## 2017-12-01 MED ORDER — BOOST PLUS PO LIQD: 237.0000 mL | Freq: Three times a day (TID) | ORAL | Status: DC

## 2017-12-01 MED ORDER — SODIUM CHLORIDE 0.9 % IV BOLUS (SEPSIS)
250.0000 mL | Freq: Once | INTRAVENOUS | Status: AC
  Administered 2017-12-01: 250 mL via INTRAVENOUS

## 2017-12-01 MED ORDER — HALOPERIDOL LACTATE 5 MG/ML IJ SOLN
2.0000 mg | Freq: Four times a day (QID) | INTRAMUSCULAR | Status: DC | PRN
  Administered 2017-12-02: 2 mg via INTRAVENOUS
  Filled 2017-12-01: qty 1

## 2017-12-01 MED ORDER — SODIUM CHLORIDE 0.9 % IV BOLUS (SEPSIS)
1000.0000 mL | Freq: Once | INTRAVENOUS | Status: AC
  Administered 2017-12-01: 1000 mL via INTRAVENOUS

## 2017-12-01 MED ORDER — SODIUM CHLORIDE 0.9 % IV SOLN
2.0000 g | INTRAVENOUS | Status: DC
  Administered 2017-12-01 – 2017-12-03 (×3): 2 g via INTRAVENOUS
  Filled 2017-12-01 (×5): qty 20

## 2017-12-01 NOTE — Progress Notes (Signed)
PROGRESS NOTE    Leah [name]  ZOX:[id number] DOB: May 11, 1935 DOA: 11/30/2017 PCP: [name], MD   Brief Narrative:  HPI on 11/30/2017 by Dr. Park Meo is a 83 y.o. female with medical history significant for Atria fib/flutter PPM status, Diastolic CHF, HTN, parkinsons, who presented to the Ed with c/o bilateral leg pain, with swelling, and redness of 1 month duration. Patient has a hx of recurrent cellulitis, and over the past month has completed BID dosing of Po doxycycline to complete 30 days.  Swelling in bilat lower extremities improved, but redness, warmth and pain worsened, and today after removing dressings present for the past 7 days, draining/weeping was noticed. Patient denies fever of chills. No vomiting. Patient also reports dysuria and frequency of 4 days duration.  Assessment & Plan   Nonpurulent cellulitis of the lower extremities  -Upon admission, patient did have an elevated lactic acid level as well as leukocytosis.  However, patient did not meet sepsis criteria upon admission. -Patient placed on IV fluids, ceftriaxone -Blood cultures pending -Suspect this is due to chronic venous stasis -Wound care consulted -LLE doppler negative for DVT  Acute kidney injury -Baseline creatinine approximately 1.1-1.3, creatinine 2.14 on admission -HCTZ and lisinopril held -Continue IV fluids, monitor BMP  Urinary tract infection -UA: Many bacteria, TNTC WBC, large leukocytes, positive nitrites -Urine culture pending -Continue ceftriaxone  Atrial flutter/fibrillation -Patient does have pacemaker -Currently on Coumadin, supratherapeutic INR 4.37, suspect secondary to antibiotics received in the emergency department including cipro and clindamycin -coumadin per pharmacy  Chronic diastolic heart failure -Currently appears to be euvolemic and compensated -No recent echocardiogram -Monitor intake and output, daily weights -HCTZ held  Diabetes  mellitus, type II -Lantus held, continue insulin sliding scale and CBG monitoring  Essential hypertension -Lisinopril and HCTZ held due to acute kidney injury -Hydralazine as needed  Parkinson's disease -Appears stable -Patient states she ambulates with the use of a walker at baseline -Continue Sinemet  DVT Prophylaxis  supratherapeutic INR, coumadin per pharmacy  Code Status: DNR  Family Communication: None at bedside  Disposition Plan: Admitted. Pending improvement.  Consultants None  Procedures  LLE doppler   Antibiotics   Anti-infectives (From admission, onward)   Start     Dose/Rate Route Frequency Ordered Stop   12/01/17 2200  cefTRIAXone (ROCEPHIN) 2 g in sodium chloride 0.9 % 100 mL IVPB     2 g 200 mL/hr over 30 Minutes Intravenous Every 24 hours 12/01/17 0141     11/30/17 2300  cefTRIAXone (ROCEPHIN) 2 g in sodium chloride 0.9 % 100 mL IVPB     2 g 200 mL/hr over 30 Minutes Intravenous  Once 11/30/17 2248 12/01/17 0000   11/30/17 2015  ciprofloxacin (CIPRO) IVPB 400 mg     400 mg 200 mL/hr over 60 Minutes Intravenous  Once 11/30/17 2006 11/30/17 2232   11/30/17 2015  clindamycin (CLEOCIN) IVPB 600 mg     600 mg 100 mL/hr over 30 Minutes Intravenous  Once 11/30/17 2006 11/30/17 2103      Subjective:   Leah [name] seen and examined today.  Patient complains of pain everywhere and states she is tired. Denies current chest pain, shortness of breath, abdominal pain, N/V/D/C.   Objective:   Vitals:   12/01/17 0001 12/01/17 0516 12/01/17 1001 12/01/17 1302  BP: (!) 147/44 (!) 145/49 (!) 130/49   Pulse: 64 66 60 60  Resp: 18 19    Temp: 97.6 F (36.4 C) 97.8 F (36.6  C)    TempSrc: Oral Oral    SpO2: 100% 100%    Weight: 83.1 kg (183 lb 3.2 oz)     Height: 5\' 4"  (1.626 m)       Intake/Output Summary (Last 24 hours) at 12/01/2017 1310 Last data filed at 12/01/2017 0754 Gross per 24 hour  Intake 1500 ml  Output 450 ml  Net 1050 ml   Filed  Weights   11/30/17 2230 12/01/17 0001  Weight: 86.6 kg (191 lb) 83.1 kg (183 lb 3.2 oz)   Exam  General: Well developed, well nourished, NAD, appears stated age  HEENT: NCAT, mucous membranes moist.   Neck: Supple  Cardiovascular: S1 S2 auscultated, no murmur, RRR  Respiratory: Clear to auscultation bilaterally with equal chest rise  Abdomen: Soft, nontender, nondistended, + bowel sounds  Extremities: warm dry without cyanosis clubbing. LLE edema>RLE. Mild erythema greater on LLE than right. TTP.  Neuro: AAOx3, cranial nerves grossly intact. Strength 5/5 in patient's upper and lower extremities bilaterally  Skin: Chronic venous skin changes on the lower ext B/L   Psych: Normal affect and demeanor with intact judgement and insight   Data Reviewed: I have personally reviewed following labs and imaging studies  CBC: Recent Labs  Lab 11/30/17 1800 12/01/17 0458  WBC 16.5* 12.4*  NEUTROABS 12.1*  --   HGB 11.6* 10.1*  HCT 37.4 33.2*  MCV 88.8 88.5  PLT 371 302   Basic Metabolic Panel: Recent Labs  Lab 11/30/17 1800 12/01/17 0458  NA 142 143  K 5.1 4.3  CL 111 115*  CO2 19* 19*  GLUCOSE 67 44*  BUN 51* 48*  CREATININE 2.14* 2.00*  CALCIUM 9.2 8.3*   GFR: Estimated Creatinine Clearance: 23 mL/min (A) (by C-G formula based on SCr of 2 mg/dL (H)). Liver Function Tests: Recent Labs  Lab 11/30/17 1800  AST 26  ALT 5*  ALKPHOS 87  BILITOT 0.4  PROT 6.9  ALBUMIN 2.8*   No results for input(s): LIPASE, AMYLASE in the last 168 hours. No results for input(s): AMMONIA in the last 168 hours. Coagulation Profile: Recent Labs  Lab 11/30/17 1932 12/01/17 0458  INR 3.53 4.37*   Cardiac Enzymes: No results for input(s): CKTOTAL, CKMB, CKMBINDEX, TROPONINI in the last 168 hours. BNP (last 3 results) No results for input(s): PROBNP in the last 8760 hours. HbA1C: No results for input(s): HGBA1C in the last 72 hours. CBG: Recent Labs  Lab 12/01/17 0002  12/01/17 0141 12/01/17 0652 12/01/17 0831 12/01/17 1205  GLUCAP 64* 92 84 77 67   Lipid Profile: No results for input(s): CHOL, HDL, LDLCALC, TRIG, CHOLHDL, LDLDIRECT in the last 72 hours. Thyroid Function Tests: No results for input(s): TSH, T4TOTAL, FREET4, T3FREE, THYROIDAB in the last 72 hours. Anemia Panel: No results for input(s): VITAMINB12, FOLATE, FERRITIN, TIBC, IRON, RETICCTPCT in the last 72 hours. Urine analysis:    Component Value Date/Time   COLORURINE YELLOW 11/30/2017 1819   APPEARANCEUR TURBID (A) 11/30/2017 1819   LABSPEC 1.015 11/30/2017 1819   PHURINE 8.5 (H) 11/30/2017 1819   GLUCOSEU NEGATIVE 11/30/2017 1819   HGBUR SMALL (A) 11/30/2017 1819   BILIRUBINUR NEGATIVE 11/30/2017 1819   KETONESUR NEGATIVE 11/30/2017 1819   PROTEINUR 100 (A) 11/30/2017 1819   UROBILINOGEN 0.2 11/29/2014 1404   NITRITE POSITIVE (A) 11/30/2017 1819   LEUKOCYTESUR LARGE (A) 11/30/2017 1819   Sepsis Labs: @LABRCNTIP (procalcitonin:4,lacticidven:4)  )No results found for this or any previous visit (from the past 240 hour(s)).  Radiology Studies: No results found.   Scheduled Meds: . atenolol  25 mg Oral QHS  . atenolol  50 mg Oral Daily  . atorvastatin  20 mg Oral q1800  . carbidopa-levodopa  2 tablet Oral TID WC  . digoxin  0.125 mg Oral Once per day on Mon Tue Thu Fri Sat  . insulin aspart  0-9 Units Subcutaneous TID WC  . Warfarin - Pharmacist Dosing Inpatient   Does not apply q1800   Continuous Infusions: . cefTRIAXone (ROCEPHIN)  IV    . sodium chloride       LOS: 1 day   Time Spent in minutes   30 minutes  Paytin Ramakrishnan D.O. on 12/01/2017 at 1:10 PM  Between 7am to 7pm - Pager - [phone]  After 7pm go to [email or web address] - password [email or web address]  And look for the night coverage person covering for me after hours  [facility] Office  [phone]

## 2017-12-01 NOTE — Progress Notes (Signed)
\  ANTICOAGULATION CONSULT NOTE  Pharmacy Consult for warfarin Indication: atrial fibrillation  Allergies  Allergen Reactions  . Penicillins Hives and Itching    Has patient had a PCN reaction causing immediate rash, facial/tongue/throat swelling, SOB or lightheadedness with hypotension: Yes Has patient had a PCN reaction causing severe rash involving mucus membranes or skin necrosis: No Has patient had a PCN reaction that required hospitalization: No Has patient had a PCN reaction occurring within the last 10 years: No If all of the above answers are "NO", then may proceed with Cephalosporin use.  . Diphenoxylate-Atropine Other (See Comments)    Unknown reaction  . Latex Itching and Rash    Patient Measurements: Height:  (162.6 cm) Weight: 183 lb 3.2 oz (83.1 kg) IBW/kg (Calculated) : 54.7  Vital Signs: Temp: 97.8 F (36.6 C) (03/22 0516) Temp Source: Oral (03/22 0516) BP: 145/49 (03/22 0516) Pulse Rate: 66 (03/22 0516)  Labs: Recent Labs    11/30/17 1800 11/30/17 1932 12/01/17 0458  HGB 11.6*  --  10.1*  HCT 37.4  --  33.2*  PLT 371  --  302  LABPROT  --  35.1* 41.4*  INR  --  3.53 4.37*  CREATININE 2.14*  --  2.00*    Estimated Creatinine Clearance: 23 mL/min (A) (by C-G formula based on SCr of 2 mg/dL (H)).   Assessment: 83yo female presents w/ chronic LE infection after completing third round of [name], Leah continue warfarin for Afib during admission.  Per medication list, she was on Keflex and doxycycline- both of which potentiate INR.  Home dose of warfarin was  daily with last dose on 3/20- admit INR was elevated at 3.53, INR increased further today Leah 4.37 (patient received a dose of clindamycin and levofloxacin in the ED which also potentiate INR).  Hgb low, but stable. Platelets within normal limits. No bleeding noted.  Goal of Therapy:  INR 2-3   Plan:  Hold warfarin Daily INR Follow for s/s bleeding  Labrea Eccleston D. Onie Hayashi, PharmD,  BCPS Clinical Pharmacist Clinical Phone for 12/01/2017 until 3:30pm: [phone] If after 3:30pm, please call main pharmacy at [phone] 12/01/2017 8:47 AM

## 2017-12-01 NOTE — Progress Notes (Addendum)
Initial Nutrition Assessment  DOCUMENTATION CODES:   Obesity unspecified  INTERVENTION:    Boost Plus po BID, each supplement provides 360 kcal and 14 grams of protein  NUTRITION DIAGNOSIS:   Increased nutrient needs related to acute illness as evidenced by estimated needs  GOAL:   Patient will meet greater than or equal to 90% of their needs  MONITOR:   PO intake, Supplement acceptance, Labs, Skin, Weight trends  REASON FOR ASSESSMENT:   Low Braden  ASSESSMENT:   83 y.o. Female with PMH significant for Atria fib/flutter PPM status, Diastolic CHF, HTN, Parkinsons, who presented to the ED with c/o bilateral leg pain, with swelling, and redness of 1 month duration.  RD spoke with pt and granddaughter at bedside. Granddaughter reports pt's appetite has been poor. Pt stated "I'm just into food like I used to be".  Lunch meal on tray table. Pt consumed ~25% per observation.  Pt lives with granddaughter. Helps prepare her meals.  Pt tries to consumes 3 per day:  Breakfast: cereal or eggs or oatmeal Lunch: sandwich or soup & crackers Dinner: meat, starch & veggie  Granddaughter also reveals pt's weight has been stable. Labs and medications reviewed. CBG's [id number].  NUTRITION - FOCUSED PHYSICAL EXAM:    Most Recent Value  Orbital Region  No depletion  Upper Arm Region  No depletion  Thoracic and Lumbar Region  No depletion  Buccal Region  No depletion  Temple Region  No depletion  Clavicle Bone Region  No depletion  Clavicle and Acromion Bone Region  No depletion  Scapular Bone Region  No depletion  Dorsal Hand  No depletion  Patellar Region  No depletion  Anterior Thigh Region  No depletion  Posterior Calf Region  No depletion  Edema (RD Assessment) Moderate [bilateral lower extremities ]     Diet Order:  Diet heart healthy/carb modified Room service appropriate? Yes; Fluid consistency: Thin  EDUCATION NEEDS:   Not appropriate for education at this  time  Skin:  Skin Assessment: Skin Integrity Issues: Skin Integrity Issues:: Other (Comment) Other: bilateral LLE; crusting and thickening of tissue consistent with venous insufficiency  Last BM:  3/21  Height:   Ht Readings from Last 1 Encounters:  12/01/17 5\' 4"  (1.626 m)    Weight:   Wt Readings from Last 1 Encounters:  12/01/17 183 lb 3.2 oz (83.1 kg)    Ideal Body Weight:  54.5 kg  BMI:  Body mass index is 31.45 kg/m.  Estimated Nutritional Needs:   Kcal:  1600-1800  Protein:  75-90 gm  Fluid:  1.6-1.8 L  Maureen ChattersKatie Jaime Dome, RD, LDN Pager #: [phone] After-Hours Pager #: [phone]

## 2017-12-01 NOTE — Progress Notes (Signed)
Report received from lab that pt INR is 4.37. MD Mikhail notified.

## 2017-12-01 NOTE — Consult Note (Signed)
WOC Nurse wound consult note Reason for Consult: Lower extremity plan of care Wound type: NO wounds present on exam Patient states a story of months of redness, weeping, crusting and care with a prescriptive order cream and compression wraps.  She further states that when the home health nurse removed her compression wraps yesterday, her daughter was concerned about how her legs were looking and wanted her brought to the hospital for evaluation and treatment. Impression:  Crusting and thickening of tissues consistent with venous insufficiency.  Plan:  Wash legs with warm soap and water daily. Pat dry.  Apply Crit[place] clear to bilateral lower legs.  Beginning just behind the toes, spiral wrap kerlex to just below the knees, then do the same with an ACE wrap. Please re-consult if you wish compression wraps to be placed prior to discharge. Thank you for the consult.  Discussed plan of care with the patient and bedside nurse.  WOC nurse will not follow at this time.  Please re-consult the WOC team if needed.  Helmut MusterSherry Joyann Spidle, RN, MSN, CWOCN, CNS-BC, pager [phone]

## 2017-12-02 LAB — BASIC METABOLIC PANEL
ANION GAP: 12 (ref 5–15)
BUN: 37 mg/dL — ABNORMAL HIGH (ref 6–20)
CO2: 17 mmol/L — ABNORMAL LOW (ref 22–32)
Calcium: 8.3 mg/dL — ABNORMAL LOW (ref 8.9–10.3)
Chloride: 112 mmol/L — ABNORMAL HIGH (ref 101–111)
Creatinine, Ser: 1.63 mg/dL — ABNORMAL HIGH (ref 0.44–1.00)
GFR, EST AFRICAN AMERICAN: 33 mL/min — AB (ref >60)
GFR, EST NON AFRICAN AMERICAN: 28 mL/min — AB (ref >60)
GLUCOSE: 118 mg/dL — AB (ref 65–99)
POTASSIUM: 4.4 mmol/L (ref 3.5–5.1)
Sodium: 141 mmol/L (ref 135–145)

## 2017-12-02 LAB — CBC
HCT: 35.1 % — ABNORMAL LOW (ref 36.0–46.0)
Hemoglobin: 10.9 g/dL — ABNORMAL LOW (ref 12.0–15.0)
MCH: 27.3 pg (ref 26.0–34.0)
MCHC: 31.1 g/dL (ref 30.0–36.0)
MCV: 87.8 fL (ref 78.0–100.0)
PLATELETS: 265 10*3/uL (ref 150–400)
RBC: 4 MIL/uL (ref 3.87–5.11)
RDW: 15.7 % — AB (ref 11.5–15.5)
WBC: 14.5 10*3/uL — AB (ref 4.0–10.5)

## 2017-12-02 LAB — GLUCOSE, CAPILLARY
GLUCOSE-CAPILLARY: 201 mg/dL — AB (ref 65–99)
Glucose-Capillary: 87 mg/dL (ref 65–99)
Glucose-Capillary: 125 mg/dL — ABNORMAL HIGH (ref 65–99)
Glucose-Capillary: 129 mg/dL — ABNORMAL HIGH (ref 65–99)
Glucose-Capillary: 234 mg/dL — ABNORMAL HIGH (ref 65–99)

## 2017-12-02 LAB — PROTIME-INR
INR: 3.22
Prothrombin Time: 32.7 seconds — ABNORMAL HIGH (ref 11.4–15.2)

## 2017-12-02 MED ORDER — WARFARIN SODIUM 1 MG PO TABS
1.0000 mg | ORAL_TABLET | Freq: Once | ORAL | Status: AC
  Administered 2017-12-02: 1 mg via ORAL
  Filled 2017-12-02: qty 1

## 2017-12-02 NOTE — Progress Notes (Signed)
\  ANTICOAGULATION CONSULT NOTE  Pharmacy Consult for warfarin Indication: atrial fibrillation  Allergies  Allergen Reactions  . Penicillins Hives and Itching    Has patient had a PCN reaction causing immediate rash, facial/tongue/throat swelling, SOB or lightheadedness with hypotension: Yes Has patient had a PCN reaction causing severe rash involving mucus membranes or skin necrosis: No Has patient had a PCN reaction that required hospitalization: No Has patient had a PCN reaction occurring within the last 10 years: No If all of the above answers are "NO", then may proceed with Cephalosporin use.  . Diphenoxylate-Atropine Other (See Comments)    Unknown reaction  . Latex Itching and Rash    Patient Measurements: Height: 5\' 4"  (162.6 cm) Weight: 183 lb 3.2 oz (83.1 kg) IBW/kg (Calculated) : 54.7  Vital Signs:    Labs: Recent Labs    11/30/17 1800 11/30/17 1932 12/01/17 0458 12/02/17 0721  HGB 11.6*  --  10.1* 10.9*  HCT 37.4  --  33.2* 35.1*  PLT 371  --  302 265  LABPROT  --  35.1* 41.4* 32.7*  INR  --  3.53 4.37* 3.22  CREATININE 2.14*  --  2.00* 1.63*    Estimated Creatinine Clearance: 28.2 mL/min (A) (by C-G formula based on SCr of 1.63 mg/dL (H)).   Assessment: 83yo female presents w/ chronic LE infection after completing third round of ABX, to continue warfarin for Afib during admission.  Per medication list, she was on Keflex and doxycycline- both of which potentiate INR.  Home dose of warfarin was 5mg  daily with last dose on 3/20- admit INR was elevated at 3.53, INR peaked at 4.37- likely d/t patient receiving a dose of clindamycin and levofloxacin in the ED.  INR today is down to 3.22. Hgb low, but stable. Platelets within normal limits. No spontaneous bleeding noted- patient did remove her IV overnight and had  expected bleeding from that.  Goal of Therapy:  INR 2-3   Plan:  Warfarin 1mg  po x1 tonight- much lower than home dose, hoping to prevent INR  from becoming subtherapeutic as it is trending down quickly. Daily INR Follow for s/s bleeding  Jessyca Sloan D. Jaisha Villacres, PharmD, BCPS Clinical Pharmacist Clinical Phone for 12/02/2017 until 3:30pm: [phone] If after 3:30pm, please call main pharmacy at [phone] 12/02/2017 11:15 AM

## 2017-12-02 NOTE — Evaluation (Signed)
Physical Therapy Evaluation Patient Details Name: Leah [name] MRN: [id number] DOB: [birth year] Today's Date: 12/02/2017   History of Present Illness  Pt adm with BLE cellulitis and developed acute metabolic encephalopathy vs delirium. Pt also with UTI. PMH - Atria fib/flutter PPM status, Diastolic CHF, HTN, parkinsons, DM  Clinical Impression  Pt presents to PT with significantly impaired mobility due to weakness, poor balance, and pain. Pt requiring more assist for basic mobility than she does at baseline and more than the family can provide at home. Recommend further rehab at [facility]. Pt's family asking about CIR and I explained pt doesn't have the tolerance for 3 hours therapy/day and pt stated "I can't do that," about the 3 hours.     Follow Up Recommendations SNF    Equipment Recommendations  None recommended by PT    Recommendations for Other Services       Precautions / Restrictions Precautions Precautions: Fall Restrictions Weight Bearing Restrictions: No      Mobility  Bed Mobility Overal bed mobility: Needs Assistance Bed Mobility: Supine to Sit;Sit to Supine     Supine to sit: +2 for physical assistance;Max assist Sit to supine: +2 for physical assistance;Max assist   General bed mobility comments: Assist for all aspects. Pt doesn't sleep in bed at home. Sleeps in lift chair.  Transfers Overall transfer level: Needs assistance Equipment used: 4-wheeled walker Transfers: Sit to/from Stand Sit to Stand: +2 physical assistance;Max assist         General transfer comment: Assist to bring hips up and for balance. Pt with feet too far in front of her base of support and too far to the right of her base of support causing posterior and rt lean  Ambulation/Gait             General Gait Details: Unable   Stairs            Wheelchair Mobility    Modified Rankin (Stroke Patients Only)       Balance Overall balance assessment: Needs  assistance Sitting-balance support: Bilateral upper extremity supported;Feet supported Sitting balance-Leahy Scale: Poor Sitting balance - Comments: Pt with min assist to sit EOB Postural control: Posterior lean;Right lateral lean Standing balance support: Bilateral upper extremity supported Standing balance-Leahy Scale: Zero Standing balance comment: Stood x 2 for 30 sec with +2 mod assist with posterior and rt lean                             Pertinent Vitals/Pain Pain Assessment: Faces Faces Pain Scale: Hurts even more Pain Location: back Pain Descriptors / Indicators: Grimacing;Guarding Pain Intervention(s): Limited activity within patient's tolerance;Monitored during session;Repositioned    Home Living Family/patient expects to be discharged to:: Private residence Living Arrangements: Other relatives(granddaughter and family) Available Help at Discharge: Family Type of Home: House Home Access: Ramped entrance     Home Layout: Able to live on main level with bedroom/bathroom;One level Home Equipment: Walker - 4 wheels;Cane - single point;Bedside commode;Shower seat      Prior Function Level of Independence: Needs assistance   Gait / Transfers Assistance Needed: supervision to min assist for transfers and short amb with rollator           Hand Dominance        Extremity/Trunk Assessment   Upper Extremity Assessment Upper Extremity Assessment: Generalized weakness    Lower Extremity Assessment Lower Extremity Assessment: RLE deficits/detail;LLE deficits/detail RLE Deficits / Details: strength <  3/5 LLE Deficits / Details: strength <3/5       Communication   Communication: No difficulties  Cognition Arousal/Alertness: Awake/alert Behavior During Therapy: WFL for tasks assessed/performed Overall Cognitive Status: Impaired/Different from baseline Area of Impairment: Attention;Following commands;Problem solving                   Current  Attention Level: Sustained   Following Commands: Follows one step commands with increased time;Follows one step commands inconsistently     Problem Solving: Slow processing;Decreased initiation;Difficulty sequencing;Requires verbal cues;Requires tactile cues General Comments: Pt cooperative       General Comments      Exercises     Assessment/Plan    PT Assessment Patient needs continued PT services  PT Problem List Decreased strength;Decreased activity tolerance;Decreased balance;Decreased mobility;Decreased cognition;Decreased knowledge of use of DME;Obesity;Pain       PT Treatment Interventions DME instruction;Gait training;Functional mobility training;Therapeutic activities;Therapeutic exercise;Balance training;Patient/family education    PT Goals (Current goals can be found in the Care Plan section)  Acute Rehab PT Goals Patient Stated Goal: not stated PT Goal Formulation: With patient/family Time For Goal Achievement: 12/16/17 Potential to Achieve Goals: Fair    Frequency Min 3X/week   Barriers to discharge        Co-evaluation               AM-PAC PT "6 Clicks" Daily Activity  Outcome Measure Difficulty turning over in bed (including adjusting bedclothes, sheets and blankets)?: Unable Difficulty moving from lying on back to sitting on the side of the bed? : Unable Difficulty sitting down on and standing up from a chair with arms (e.g., wheelchair, bedside commode, etc,.)?: Unable Help needed moving to and from a bed to chair (including a wheelchair)?: Total Help needed walking in hospital room?: Total Help needed climbing 3-5 steps with a railing? : Total 6 Click Score: 6    End of Session Equipment Utilized During Treatment: Gait belt Activity Tolerance: Patient limited by fatigue Patient left: in bed;with call bell/phone within reach;with family/visitor present Nurse Communication: Mobility status;Other (comment)(notified tech that [employer] was  out) PT Visit Diagnosis: Other abnormalities of gait and mobility (R26.89);Muscle weakness (generalized) (M62.81)    Time: [id number] PT Time Calculation (min) (ACUTE ONLY): 22 min   Charges:   PT Evaluation $PT Eval Moderate Complexity: 1 Mod     PT G CodesMarland Kitchen:        [facility] Kilie Rund PT [phone]   Angelina OkCary W [facility] 12/02/2017, 4:56 PM

## 2017-12-02 NOTE — Progress Notes (Signed)
Patient refused all morning medications.  Patient educated on the importance of medication compliance.  Will continue to monitor and attempt again later.

## 2017-12-02 NOTE — Progress Notes (Signed)
2000- Patient resting comfortably, pleasantly confused during assessment. RN performed a FAST screen, it was negative. Family stated patient is not normally confused but was not concerned r/t patient's hx of Parkinson's.  2200- Patient still confused and is now refusing care. She is upset about being in the hospital and is blaming her granddaughter for bringing her here, patient called police on granddaughter. Because fluids were already running, RN was able to hang IVPB abx. MD text paged, received PRN order for Haldol.  0000- Patient removed IV, bed and gown were saturated with blood. Patient was verbally abusive to staff while she was given a bath, but allowed staff to clean her and change her bed.  0100- Patient silently let RN re-start IV.  0500- Patient refuses care, will not allow CBG or vitals to be taken. When asked questions, replies with "no" "I don't want to tell you" "It's none of your business" "It doesn't matter" "I don't care" and refuses to follow commands. Family is very concerned. MD paged and called RN back, RN inquired about a CT, MD felt it would be unnecessary and the confusion is related to the infection.   0520-RN called rapid response to confirm confusion is not neurological. Patient refused to interact with the rapid RN except to tell him to go away. Patient states she does not recognize her family members whom she had been calling by name earlier in the night. Rapid RN feels confusion is not neurological, but could be from a combination of low blood sugar, the infection, not being able to sleep, possible hospital delirium.  [id number]- RN had assistance in obtaining patient's CBG: 87.

## 2017-12-02 NOTE — Progress Notes (Signed)
[facility] for progressive confusion and worsening agitation.  Upon arrival, pt was asleep.  Pt woke after physical stimulation.  Pt was disoriented x4, generally weak but moves all 4 extremities equally.  Pt's speech was clear and communicating clearly things she did not want you to do.  Family at bedside.  POC discussed with nursing staff.

## 2017-12-02 NOTE — Progress Notes (Signed)
PROGRESS NOTE    Leah [name]  WUJ:[id number] DOB: Nov 14, 1934 DOA: 11/30/2017 PCP: [name], MD   Brief Narrative:  HPI on 11/30/2017 by Dr. Park Meo is a 83 y.o. female with medical history significant for Atria fib/flutter PPM status, Diastolic CHF, HTN, parkinsons, who presented to the Ed with c/o bilateral leg pain, with swelling, and redness of 1 month duration. Patient has a hx of recurrent cellulitis, and over the past month has completed BID dosing of Po doxycycline to complete 30 days.  Swelling in bilat lower extremities improved, but redness, warmth and pain worsened, and today after removing dressings present for the past 7 days, draining/weeping was noticed. Patient denies fever of chills. No vomiting. Patient also reports dysuria and frequency of 4 days duration.  Interim history Admitted for suspected cellulitis of the lower extremities. Found to have UTI- currently on ceftriaxone. PT consulted.  Assessment & Plan   Nonpurulent cellulitis of the lower extremities  -Upon admission, patient did have an elevated lactic acid level as well as leukocytosis.  However, patient did not meet sepsis criteria upon admission. -Patient placed on IV fluids, ceftriaxone -Blood cultures pending -Suspect this is due to chronic venous stasis -Wound care consulted- and feels patient's skin changes are due to venous insufficiency  -LLE doppler negative for DVT  Acute metabolic encephalopathy vs acute delirium  -likely due to infection, UTI -overnight had confusion and has been refusing medications- -continue treatment of UTI and monitor   Acute kidney injury -Baseline creatinine approximately 1.1-1.3, creatinine 2.14 on admission -Creatinine improved to 1.63 -HCTZ and lisinopril held -Continue IV fluids, monitor BMP  Urinary tract infection -UA: Many bacteria, TNTC WBC, large leukocytes, positive nitrites -Urine culture >100K proteus mirabilis , 50K  Ecoli  -Continue ceftriaxone  Atrial flutter/fibrillation -Patient does have pacemaker -Currently on Coumadin, supratherapeutic INR 4.37, suspect secondary to antibiotics received in the emergency department including cipro and clindamycin -coumadin per pharmacy -INR down 3.22  Chronic diastolic heart failure -Currently appears to be euvolemic and compensated -No recent echocardiogram -Monitor intake and output, daily weights -HCTZ held  Diabetes mellitus, type II -Lantus held, continue insulin sliding scale and CBG monitoring  Essential hypertension -Lisinopril and HCTZ held due to acute kidney injury -Hydralazine as needed  Parkinson's disease -Appears stable -Patient states she ambulates with the use of a walker at baseline -Continue Sinemet -will consult PT  DVT Prophylaxis  supratherapeutic INR, coumadin per pharmacy  Code Status: DNR  Family Communication: Son at bedside  Disposition Plan: Admitted. Pending improvement. Will likely need SNF  Consultants None  Procedures  LLE doppler   Antibiotics   Anti-infectives (From admission, onward)   Start     Dose/Rate Route Frequency Ordered Stop   12/01/17 2200  cefTRIAXone (ROCEPHIN) 2 g in sodium chloride 0.9 % 100 mL IVPB     2 g 200 mL/hr over 30 Minutes Intravenous Every 24 hours 12/01/17 0141     11/30/17 2300  cefTRIAXone (ROCEPHIN) 2 g in sodium chloride 0.9 % 100 mL IVPB     2 g 200 mL/hr over 30 Minutes Intravenous  Once 11/30/17 2248 12/01/17 0000   11/30/17 2015  ciprofloxacin (CIPRO) IVPB 400 mg     400 mg 200 mL/hr over 60 Minutes Intravenous  Once 11/30/17 2006 11/30/17 2232   11/30/17 2015  clindamycin (CLEOCIN) IVPB 600 mg     600 mg 100 mL/hr over 30 Minutes Intravenous  Once 11/30/17 2006 11/30/17 2103  Subjective:   Knox Royalty seen and examined today.  Patient called me a "shit head" this morning as I was examining her. Did not want to be bothered.  Objective:   Vitals:    12/01/17 1001 12/01/17 1302 12/01/17 1332 12/01/17 2121  BP: (!) 130/49  (!) 143/56 (!) 150/61  Pulse: 60 60 (!) 59 60  Resp:   20 18  Temp:   98 F (36.7 C) 98.9 F (37.2 C)  TempSrc:   Oral Oral  SpO2:   100% 98%  Weight:      Height:        Intake/Output Summary (Last 24 hours) at 12/02/2017 1212 Last data filed at 12/02/2017 1610 Gross per 24 hour  Intake 80 ml  Output 500 ml  Net -420 ml   Filed Weights   11/30/17 2230 12/01/17 0001  Weight: 86.6 kg (191 lb) 83.1 kg (183 lb 3.2 oz)   Exam  General: Well developed, well nourished, NAD, appears stated age  HEENT: NCAT, mucous membranes moist.   Neck: Supple  Cardiovascular: S1 S2 auscultated, no rubs, murmurs or gallops. Regular rate and rhythm.  Respiratory: Clear to auscultation bilaterally with equal chest rise  Abdomen: Soft, nontender, nondistended, + bowel sounds  Extremities: warm dry without cyanosis clubbing or edema  Neuro: awake, not alert or oriented. Moving extremities  Psych: Annoyed  Data Reviewed: I have personally reviewed following labs and imaging studies  CBC: Recent Labs  Lab 11/30/17 1800 12/01/17 0458 12/02/17 0721  WBC 16.5* 12.4* 14.5*  NEUTROABS 12.1*  --   --   HGB 11.6* 10.1* 10.9*  HCT 37.4 33.2* 35.1*  MCV 88.8 88.5 87.8  PLT 371 302 265   Basic Metabolic Panel: Recent Labs  Lab 11/30/17 1800 12/01/17 0458 12/02/17 0721  NA 142 143 141  K 5.1 4.3 4.4  CL 111 115* 112*  CO2 19* 19* 17*  GLUCOSE 67 44* 118*  BUN 51* 48* 37*  CREATININE 2.14* 2.00* 1.63*  CALCIUM 9.2 8.3* 8.3*   GFR: Estimated Creatinine Clearance: 28.2 mL/min (A) (by C-G formula based on SCr of 1.63 mg/dL (H)). Liver Function Tests: Recent Labs  Lab 11/30/17 1800  AST 26  ALT 5*  ALKPHOS 87  BILITOT 0.4  PROT 6.9  ALBUMIN 2.8*   No results for input(s): LIPASE, AMYLASE in the last 168 hours. No results for input(s): AMMONIA in the last 168 hours. Coagulation Profile: Recent Labs    Lab 11/30/17 1932 12/01/17 0458 12/02/17 0721  INR 3.53 4.37* 3.22   Cardiac Enzymes: No results for input(s): CKTOTAL, CKMB, CKMBINDEX, TROPONINI in the last 168 hours. BNP (last 3 results) No results for input(s): PROBNP in the last 8760 hours. HbA1C: No results for input(s): HGBA1C in the last 72 hours. CBG: Recent Labs  Lab 12/01/17 1205 12/01/17 1700 12/01/17 2117 12/02/17 0541 12/02/17 0748  GLUCAP 67 110* 116* 87 125*   Lipid Profile: No results for input(s): CHOL, HDL, LDLCALC, TRIG, CHOLHDL, LDLDIRECT in the last 72 hours. Thyroid Function Tests: No results for input(s): TSH, T4TOTAL, FREET4, T3FREE, THYROIDAB in the last 72 hours. Anemia Panel: No results for input(s): VITAMINB12, FOLATE, FERRITIN, TIBC, IRON, RETICCTPCT in the last 72 hours. Urine analysis:    Component Value Date/Time   COLORURINE YELLOW 11/30/2017 1819   APPEARANCEUR TURBID (A) 11/30/2017 1819   LABSPEC 1.015 11/30/2017 1819   PHURINE 8.5 (H) 11/30/2017 1819   GLUCOSEU NEGATIVE 11/30/2017 1819   HGBUR SMALL (A) 11/30/2017 1819  BILIRUBINUR NEGATIVE 11/30/2017 1819   KETONESUR NEGATIVE 11/30/2017 1819   PROTEINUR 100 (A) 11/30/2017 1819   UROBILINOGEN 0.2 11/29/2014 1404   NITRITE POSITIVE (A) 11/30/2017 1819   LEUKOCYTESUR LARGE (A) 11/30/2017 1819   Sepsis Labs: @LABRCNTIP (procalcitonin:4,lacticidven:4)  ) Recent Results (from the past 240 hour(s))  Urine culture     Status: Abnormal (Preliminary result)   Collection Time: 11/30/17  6:19 PM  Result Value Ref Range Status   Specimen Description URINE, RANDOM  Final   Special Requests NONE  Final   Culture (A)  Final    >=100,000 COLONIES/mL PROTEUS MIRABILIS 50,000 COLONIES/mL ESCHERICHIA COLI SUSCEPTIBILITIES TO FOLLOW Performed at [facility], [address] [place], [place] [address]    Report Status PENDING  Incomplete  Blood Culture (routine x 2)     Status: None (Preliminary result)   Collection Time:  11/30/17  8:09 PM  Result Value Ref Range Status   Specimen Description BLOOD RIGHT ANTECUBITAL  Final   Special Requests   Final    Blood Culture adequate volume BOTTLES DRAWN AEROBIC AND ANAEROBIC   Culture   Final    NO GROWTH < 24 HOURS Performed at [facility], [address] [place], [place] [address]    Report Status PENDING  Incomplete  Blood Culture (routine x 2)     Status: None (Preliminary result)   Collection Time: 11/30/17  8:14 PM  Result Value Ref Range Status   Specimen Description BLOOD LEFT FOREARM  Final   Special Requests   Final    Blood Culture adequate volume BOTTLES DRAWN AEROBIC AND ANAEROBIC   Culture   Final    NO GROWTH < 24 HOURS Performed at [facility], [address] [place], [place] [address]    Report Status PENDING  Incomplete      Radiology Studies: No results found.   Scheduled Meds: . atenolol  25 mg Oral QHS  . atenolol  50 mg Oral Daily  . atorvastatin  20 mg Oral q1800  . carbidopa-levodopa  2 tablet Oral TID WC  . digoxin  0.125 mg Oral Once per day on Mon Tue Thu Fri Sat  . insulin aspart  0-9 Units Subcutaneous TID WC  . lactose free nutrition  237 mL Oral BID BM  . warfarin  1 mg Oral ONCE-1800  . Warfarin - Pharmacist Dosing Inpatient   Does not apply q1800   Continuous Infusions: . cefTRIAXone (ROCEPHIN)  IV Stopped (12/01/17 2246)  . sodium chloride       LOS: 2 days   Time Spent in minutes   30 minutes  [place] Kirchner D.O. on 12/02/2017 at 12:12 PM  Between 7am to 7pm - Pager - [phone]  After 7pm go to [email or web address] - password [email or web address]  And look for the night coverage person covering for me after hours  [facility] Office  [phone]

## 2017-12-03 LAB — URINE CULTURE: Culture: >=100000 — AB

## 2017-12-03 LAB — GLUCOSE, CAPILLARY
GLUCOSE-CAPILLARY: 127 mg/dL — AB (ref 65–99)
GLUCOSE-CAPILLARY: 153 mg/dL — AB (ref 65–99)
GLUCOSE-CAPILLARY: 200 mg/dL — AB (ref 65–99)
Glucose-Capillary: 289 mg/dL — ABNORMAL HIGH (ref 65–99)
Glucose-Capillary: 294 mg/dL — ABNORMAL HIGH (ref 65–99)
Glucose-Capillary: 125 mg/dL — ABNORMAL HIGH (ref 65–99)

## 2017-12-03 LAB — CBC
HCT: 33.3 % — ABNORMAL LOW (ref 36.0–46.0)
HEMOGLOBIN: 10.6 g/dL — AB (ref 12.0–15.0)
MCH: 27.9 pg (ref 26.0–34.0)
MCHC: 31.8 g/dL (ref 30.0–36.0)
MCV: 87.6 fL (ref 78.0–100.0)
Platelets: 279 10*3/uL (ref 150–400)
RBC: 3.8 MIL/uL — AB (ref 3.87–5.11)
RDW: 16.1 % — ABNORMAL HIGH (ref 11.5–15.5)
WBC: 14.2 10*3/uL — ABNORMAL HIGH (ref 4.0–10.5)

## 2017-12-03 LAB — LACTIC ACID, PLASMA: LACTIC ACID, VENOUS: 0.8 mmol/L (ref 0.5–1.9)

## 2017-12-03 LAB — PROTIME-INR
INR: 2.9
Prothrombin Time: 30.1 seconds — ABNORMAL HIGH (ref 11.4–15.2)

## 2017-12-03 MED ORDER — WARFARIN SODIUM 2.5 MG PO TABS
2.5000 mg | ORAL_TABLET | Freq: Once | ORAL | Status: AC
  Administered 2017-12-03: 2.5 mg via ORAL
  Filled 2017-12-03: qty 1

## 2017-12-03 NOTE — Progress Notes (Addendum)
PROGRESS NOTE    Leah [name]  WUJ:[id number] DOB: 16-Feb-1935 DOA: 11/30/2017 PCP: [name], MD   Brief Narrative:  HPI on 11/30/2017 by Dr. Park Meo is a 83 y.o. female with medical history significant for Atria fib/flutter PPM status, Diastolic CHF, HTN, parkinsons, who presented to the Ed with c/o bilateral leg pain, with swelling, and redness of 1 month duration. Patient has a hx of recurrent cellulitis, and over the past month has completed BID dosing of Po doxycycline to complete 30 days.  Swelling in bilat lower extremities improved, but redness, warmth and pain worsened, and today after removing dressings present for the past 7 days, draining/weeping was noticed. Patient denies fever of chills. No vomiting. Patient also reports dysuria and frequency of 4 days duration.  Interim history Admitted for suspected cellulitis of the lower extremities. Found to have UTI- currently on ceftriaxone. PT consulted, recommended SNF.  Assessment & Plan   ?Nonpurulent cellulitis of the lower extremities - likely venous insufficiency -Upon admission, patient did have an elevated lactic acid level as well as leukocytosis.  However, patient did not meet sepsis criteria upon admission. -Patient placed on IV fluids, ceftriaxone -Blood cultures show no growth to date -Suspect this is due to chronic venous stasis -Wound care consulted- and feels patient's skin changes are due to venous insufficiency  -LLE doppler negative for DVT  Acute metabolic encephalopathy vs acute delirium  -likely due to infection, UTI -appears to be improved this morning -continue treatment of UTI   Acute kidney injury -Baseline creatinine approximately 1.1-1.3, creatinine 2.14 on admission -Creatinine improved to 1.63 -HCTZ and lisinopril held -Continue IV fluids, monitor BMP  Urinary tract infection -UA: Many bacteria, TNTC WBC, large leukocytes, positive nitrites -Urine culture  >100K proteus mirabilis , 50K Ecoli- however final culture no longer shows Ecoli  -Continue ceftriaxone  Atrial flutter/fibrillation -Patient does have pacemaker -Currently on Coumadin, supratherapeutic INR 4.37, suspect secondary to antibiotics received in the emergency department including cipro and clindamycin -coumadin per pharmacy -INR down to 2.9  Chronic diastolic heart failure -Currently appears to be euvolemic and compensated -No recent echocardiogram -Monitor intake and output, daily weights -HCTZ held  Diabetes mellitus, type II -Lantus held, continue insulin sliding scale and CBG monitoring  Essential hypertension -Lisinopril and HCTZ held due to acute kidney injury -Hydralazine as needed  Parkinson's disease -Appears stable -Patient states she ambulates with the use of a walker at baseline -Continue Sinemet -PT recommended SNF  DVT Prophylaxis  supratherapeutic INR, coumadin per pharmacy  Code Status: DNR  Family Communication: None at bedside  Disposition Plan: Admitted. Pending improvement. Will need SNF, social work consulted.  Consultants None  Procedures  LLE doppler   Antibiotics   Anti-infectives (From admission, onward)   Start     Dose/Rate Route Frequency Ordered Stop   12/01/17 2200  cefTRIAXone (ROCEPHIN) 2 g in sodium chloride 0.9 % 100 mL IVPB     2 g 200 mL/hr over 30 Minutes Intravenous Every 24 hours 12/01/17 0141     11/30/17 2300  cefTRIAXone (ROCEPHIN) 2 g in sodium chloride 0.9 % 100 mL IVPB     2 g 200 mL/hr over 30 Minutes Intravenous  Once 11/30/17 2248 12/01/17 0000   11/30/17 2015  ciprofloxacin (CIPRO) IVPB 400 mg     400 mg 200 mL/hr over 60 Minutes Intravenous  Once 11/30/17 2006 11/30/17 2232   11/30/17 2015  clindamycin (CLEOCIN) IVPB 600 mg     600  mg 100 mL/hr over 30 Minutes Intravenous  Once 11/30/17 2006 11/30/17 2103      Subjective:   Leah [name] seen and examined today.  Patient continues to  complain of pain in her back and bottom. Denies current chest pain. Shortness of breath, abdominal pain, N/V.   Objective:   Vitals:   12/01/17 2121 12/02/17 1313 12/02/17 2142 12/03/17 0431  BP: (!) 150/61 (!) 169/85 (!) 153/65 (!) 165/54  Pulse: 60 (!) 59 (!) 59 60  Resp: 18 16 14 18   Temp: 98.9 F (37.2 C) 97.9 F (36.6 C) 98.4 F (36.9 C) 98.6 F (37 C)  TempSrc: Oral Oral Oral Oral  SpO2: 98% 98% 99% 98%  Weight:      Height:        Intake/Output Summary (Last 24 hours) at 12/03/2017 1128 Last data filed at 12/03/2017 1191 Gross per 24 hour  Intake 340 ml  Output 850 ml  Net -510 ml   Filed Weights   11/30/17 2230 12/01/17 0001  Weight: 86.6 kg (191 lb) 83.1 kg (183 lb 3.2 oz)   Exam  General: Well developed, chronically ill appearing, elderly, NAD  HEENT: NCAT, mucous membranes moist.   Neck: Supple  Cardiovascular: S1 S2 auscultated, RRR, no murmur  Respiratory: Clear to auscultation bilaterally with equal chest rise  Abdomen: Soft, nontender, nondistended, + bowel sounds  Extremities: warm dry without cyanosis clubbing. ACE wraps on lower ext B/L   Neuro: AAOx2 (self, place), nonfocal  Skin: Without rashes exudates or nodules  Psych: appropriate mood and affect  Data Reviewed: I have personally reviewed following labs and imaging studies  CBC: Recent Labs  Lab 11/30/17 1800 12/01/17 0458 12/02/17 0721 12/03/17 0631  WBC 16.5* 12.4* 14.5* 14.2*  NEUTROABS 12.1*  --   --   --   HGB 11.6* 10.1* 10.9* 10.6*  HCT 37.4 33.2* 35.1* 33.3*  MCV 88.8 88.5 87.8 87.6  PLT 371 302 265 279   Basic Metabolic Panel: Recent Labs  Lab 11/30/17 1800 12/01/17 0458 12/02/17 0721  NA 142 143 141  K 5.1 4.3 4.4  CL 111 115* 112*  CO2 19* 19* 17*  GLUCOSE 67 44* 118*  BUN 51* 48* 37*  CREATININE 2.14* 2.00* 1.63*  CALCIUM 9.2 8.3* 8.3*   GFR: Estimated Creatinine Clearance: 28.2 mL/min (A) (by C-G formula based on SCr of 1.63 mg/dL (H)). Liver  Function Tests: Recent Labs  Lab 11/30/17 1800  AST 26  ALT 5*  ALKPHOS 87  BILITOT 0.4  PROT 6.9  ALBUMIN 2.8*   No results for input(s): LIPASE, AMYLASE in the last 168 hours. No results for input(s): AMMONIA in the last 168 hours. Coagulation Profile: Recent Labs  Lab 11/30/17 1932 12/01/17 0458 12/02/17 0721 12/03/17 0631  INR 3.53 4.37* 3.22 2.90   Cardiac Enzymes: No results for input(s): CKTOTAL, CKMB, CKMBINDEX, TROPONINI in the last 168 hours. BNP (last 3 results) No results for input(s): PROBNP in the last 8760 hours. HbA1C: No results for input(s): HGBA1C in the last 72 hours. CBG: Recent Labs  Lab 12/02/17 1742 12/02/17 1933 12/03/17 0046 12/03/17 0426 12/03/17 0913  GLUCAP 201* 234* 153* 127* 289*   Lipid Profile: No results for input(s): CHOL, HDL, LDLCALC, TRIG, CHOLHDL, LDLDIRECT in the last 72 hours. Thyroid Function Tests: No results for input(s): TSH, T4TOTAL, FREET4, T3FREE, THYROIDAB in the last 72 hours. Anemia Panel: No results for input(s): VITAMINB12, FOLATE, FERRITIN, TIBC, IRON, RETICCTPCT in the last 72 hours. Urine analysis:  Component Value Date/Time   COLORURINE YELLOW 11/30/2017 1819   APPEARANCEUR TURBID (A) 11/30/2017 1819   LABSPEC 1.015 11/30/2017 1819   PHURINE 8.5 (H) 11/30/2017 1819   GLUCOSEU NEGATIVE 11/30/2017 1819   HGBUR SMALL (A) 11/30/2017 1819   BILIRUBINUR NEGATIVE 11/30/2017 1819   KETONESUR NEGATIVE 11/30/2017 1819   PROTEINUR 100 (A) 11/30/2017 1819   UROBILINOGEN 0.2 11/29/2014 1404   NITRITE POSITIVE (A) 11/30/2017 1819   LEUKOCYTESUR LARGE (A) 11/30/2017 1819   Sepsis Labs: @LABRCNTIP (procalcitonin:4,lacticidven:4)  ) Recent Results (from the past 240 hour(s))  Urine culture     Status: Abnormal   Collection Time: 11/30/17  6:19 PM  Result Value Ref Range Status   Specimen Description URINE, RANDOM  Final   Special Requests   Final    NONE Performed at [facility], [address] [place], [place] [address]    Culture >=100,000 COLONIES/mL PROTEUS MIRABILIS (A)  Final   Report Status 12/03/2017 FINAL  Final   Organism ID, Bacteria PROTEUS MIRABILIS (A)  Final      Susceptibility   Proteus mirabilis - MIC*    AMPICILLIN <=2 SENSITIVE Sensitive     CEFAZOLIN <=4 SENSITIVE Sensitive     CEFTRIAXONE <=1 SENSITIVE Sensitive     CIPROFLOXACIN <=0.25 SENSITIVE Sensitive     GENTAMICIN <=1 SENSITIVE Sensitive     IMIPENEM 1 SENSITIVE Sensitive     NITROFURANTOIN 128 RESISTANT Resistant     TRIMETH/SULFA <=20 SENSITIVE Sensitive     AMPICILLIN/SULBACTAM <=2 SENSITIVE Sensitive     PIP/TAZO <=4 SENSITIVE Sensitive     * >=100,000 COLONIES/mL PROTEUS MIRABILIS  Blood Culture (routine x 2)     Status: None (Preliminary result)   Collection Time: 11/30/17  8:09 PM  Result Value Ref Range Status   Specimen Description BLOOD RIGHT ANTECUBITAL  Final   Special Requests   Final    Blood Culture adequate volume BOTTLES DRAWN AEROBIC AND ANAEROBIC   Culture   Final    NO GROWTH 2 DAYS Performed at [facility], [address] [place], [place] [address]    Report Status PENDING  Incomplete  Blood Culture (routine x 2)     Status: None (Preliminary result)   Collection Time: 11/30/17  8:14 PM  Result Value Ref Range Status   Specimen Description BLOOD LEFT FOREARM  Final   Special Requests   Final    Blood Culture adequate volume BOTTLES DRAWN AEROBIC AND ANAEROBIC   Culture   Final    NO GROWTH 2 DAYS Performed at [facility], [address] [place], [place] [address]    Report Status PENDING  Incomplete      Radiology Studies: No results found.   Scheduled Meds: . atenolol  25 mg Oral QHS  . atenolol  50 mg Oral Daily  . atorvastatin  20 mg Oral q1800  . carbidopa-levodopa  2 tablet Oral TID WC  . digoxin  0.125 mg Oral Once per day on Mon Tue Thu Fri Sat  . insulin aspart  0-9 Units Subcutaneous TID WC  . lactose free nutrition  237 mL Oral  BID BM  . warfarin  2.5 mg Oral ONCE-1800  . Warfarin - Pharmacist Dosing Inpatient   Does not apply q1800   Continuous Infusions: . cefTRIAXone (ROCEPHIN)  IV Stopped (12/02/17 2215)  . sodium chloride       LOS: 3 days   Time Spent in minutes   30 minutes  Leah [name] D.O. on 12/03/2017 at 11:28 AM  Between 7am to 7pm - Pager - [phone]  After 7pm go to [email or web address] - password [email or web address]  And look for the night coverage person covering for me after hours  [facility] Office  [phone]

## 2017-12-03 NOTE — Progress Notes (Signed)
\  ANTICOAGULATION CONSULT NOTE  Pharmacy Consult for warfarin Indication: atrial fibrillation  Allergies  Allergen Reactions  . Penicillins Hives and Itching    Has patient had a PCN reaction causing immediate rash, facial/tongue/throat swelling, SOB or lightheadedness with hypotension: Yes Has patient had a PCN reaction causing severe rash involving mucus membranes or skin necrosis: No Has patient had a PCN reaction that required hospitalization: No Has patient had a PCN reaction occurring within the last 10 years: No If all of the above answers are "NO", then may proceed with Cephalosporin use.  . Diphenoxylate-Atropine Other (See Comments)    Unknown reaction  . Latex Itching and Rash    Patient Measurements: Height: 5\' 4"  (162.6 cm) Weight: 183 lb 3.2 oz (83.1 kg) IBW/kg (Calculated) : 54.7  Vital Signs: Temp: 98.6 F (37 C) (03/24 0431) Temp Source: Oral (03/24 0431) BP: 165/54 (03/24 0431) Pulse Rate: 60 (03/24 0431)  Labs: Recent Labs    11/30/17 1800  12/01/17 0458 12/02/17 0721 12/03/17 0631  HGB 11.6*  --  10.1* 10.9* 10.6*  HCT 37.4  --  33.2* 35.1* 33.3*  PLT 371  --  302 265 279  LABPROT  --    < > 41.4* 32.7* 30.1*  INR  --    < > 4.37* 3.22 2.90  CREATININE 2.14*  --  2.00* 1.63*  --    < > = values in this interval not displayed.    Estimated Creatinine Clearance: 28.2 mL/min (A) (by C-G formula based on SCr of 1.63 mg/dL (H)).   Assessment: 83yo female presents w/ chronic LE infection after completing third round of ABX, to continue warfarin for Afib during admission.  Per medication list, she was on Keflex and doxycycline- both of which potentiate INR.  Home dose of warfarin was 5mg  daily with last dose on 3/20- admit INR was elevated at 3.53, INR peaked at 4.37- likely d/t patient receiving a dose of clindamycin and levofloxacin in the ED.  INR today is down to 2.9. She received a low dose of warfarin last evening. Hgb low, but stable.  Platelets within normal limits. No bleeding noted.  Goal of Therapy:  INR 2-3   Plan:  Warfarin 2.5mg  po x1 tonight Daily INR Follow for s/s bleeding  Rudolph Dobler D. Toniyah Dilmore, PharmD, BCPS Clinical Pharmacist Clinical Phone for 12/03/2017 until 3:30pm: [phone] If after 3:30pm, please call main pharmacy at [phone] 12/03/2017 10:51 AM

## 2017-12-04 DIAGNOSIS — L03119 Cellulitis of unspecified part of limb: Secondary | ICD-10-CM

## 2017-12-04 DIAGNOSIS — N183 Chronic kidney disease, stage 3 (moderate): Secondary | ICD-10-CM

## 2017-12-04 DIAGNOSIS — G20 Parkinson's disease: Secondary | ICD-10-CM

## 2017-12-04 DIAGNOSIS — I5032 Chronic diastolic (congestive) heart failure: Secondary | ICD-10-CM

## 2017-12-04 DIAGNOSIS — N179 Acute kidney failure, unspecified: Secondary | ICD-10-CM

## 2017-12-04 DIAGNOSIS — Z794 Long term (current) use of insulin: Secondary | ICD-10-CM

## 2017-12-04 DIAGNOSIS — E1122 Type 2 diabetes mellitus with diabetic chronic kidney disease: Secondary | ICD-10-CM

## 2017-12-04 DIAGNOSIS — I480 Paroxysmal atrial fibrillation: Secondary | ICD-10-CM

## 2017-12-04 LAB — GLUCOSE, CAPILLARY
GLUCOSE-CAPILLARY: 123 mg/dL — AB (ref 65–99)
Glucose-Capillary: 160 mg/dL — ABNORMAL HIGH (ref 65–99)
Glucose-Capillary: 137 mg/dL — ABNORMAL HIGH (ref 65–99)

## 2017-12-04 LAB — BASIC METABOLIC PANEL
ANION GAP: 10 (ref 5–15)
BUN: 29 mg/dL — AB (ref 6–20)
CALCIUM: 8.5 mg/dL — AB (ref 8.9–10.3)
CO2: 19 mmol/L — ABNORMAL LOW (ref 22–32)
Chloride: 111 mmol/L (ref 101–111)
Creatinine, Ser: 1.49 mg/dL — ABNORMAL HIGH (ref 0.44–1.00)
GFR calc Af Amer: 37 mL/min — ABNORMAL LOW (ref >60)
GFR, EST NON AFRICAN AMERICAN: 32 mL/min — AB (ref >60)
GLUCOSE: 117 mg/dL — AB (ref 65–99)
POTASSIUM: 4.1 mmol/L (ref 3.5–5.1)
SODIUM: 140 mmol/L (ref 135–145)

## 2017-12-04 LAB — PROTIME-INR
INR: 1.82
PROTHROMBIN TIME: 20.9 s — AB (ref 11.4–15.2)

## 2017-12-04 LAB — CBC
HCT: 34.1 % — ABNORMAL LOW (ref 36.0–46.0)
Hemoglobin: 10.7 g/dL — ABNORMAL LOW (ref 12.0–15.0)
MCH: 27.4 pg (ref 26.0–34.0)
MCHC: 31.4 g/dL (ref 30.0–36.0)
MCV: 87.4 fL (ref 78.0–100.0)
PLATELETS: 264 10*3/uL (ref 150–400)
RBC: 3.9 MIL/uL (ref 3.87–5.11)
RDW: 16 % — ABNORMAL HIGH (ref 11.5–15.5)
WBC: 13.5 10*3/uL — AB (ref 4.0–10.5)

## 2017-12-04 MED ORDER — HYDROCODONE-ACETAMINOPHEN 5-325 MG PO TABS: 1.0000 | ORAL_TABLET | Freq: Two times a day (BID) | ORAL | 0 refills | Status: DC | PRN

## 2017-12-04 MED ORDER — CEPHALEXIN 250 MG PO CAPS: 250.0000 mg | ORAL_CAPSULE | Freq: Two times a day (BID) | ORAL | 0 refills | Status: AC

## 2017-12-04 NOTE — [place] FL2 (Signed)
[place] MEDICAID FL2 LEVEL OF CARE SCREENING TOOL     IDENTIFICATION  Patient Name: Leah [name] Birthdate: 04/22/1935 Sex: female Admission Date (Current Location): 11/30/2017  [place] [place]   Facility and Address:  [facility] [place], [address], [place], [place] [address]      Provider Number: [id number]  Attending Physician Name and Address:  Glade LloydAlekh, Kshitiz, MD  Relative Name and Phone Number:  Homero FellersSue Ellene, daughter, [phone]    Current Level of Care: Hospital Recommended Level of Care: Skilled Nursing Facility Prior Approval Number:    Date Approved/Denied:   PASRR Number: [id number]  Discharge Plan: SNF    Current Diagnoses: Patient Active Problem List   Diagnosis Date Noted  . Pressure injury of skin 12/01/2017  . Cellulitis 12/11/2016  . History of recurrent UTIs 12/11/2016  . Acute respiratory failure with hypoxia (HCC) 12/10/2014  . Acute on chronic diastolic heart failure (HCC) 12/10/2014  . Parkinson disease (HCC) 12/10/2014  . Palliative care encounter 12/01/2014  . Dyspnea 12/01/2014  . Weakness generalized 12/01/2014  . DNR (do not resuscitate) 12/01/2014  . CAP (community acquired pneumonia)   . Severe sepsis (HCC) 11/29/2014  . PNA (pneumonia) 11/29/2014  . Cellulitis of right leg 11/29/2014  . DM (diabetes mellitus), type 2 (HCC) 11/29/2014  . ATRIAL FLUTTER 09/01/2010  . Chronic diastolic heart failure (HCC) 09/01/2010  . PPM-Medtronic 09/01/2010  . DM 08/31/2010  . HYPERLIPIDEMIA-MIXED 08/31/2010  . HYPERTENSION, BENIGN 08/31/2010  . Atrial fibrillation (HCC) 08/31/2010  . Chronic kidney disease 08/31/2010    Orientation RESPIRATION BLADDER Height & Weight     Self, Time  Normal Incontinent, External catheter Weight: 83.1 kg (183 lb 3.2 oz) Height:  5\' 4"  (162.6 cm)  BEHAVIORAL SYMPTOMS/MOOD NEUROLOGICAL BOWEL NUTRITION STATUS      Incontinent Diet(Please see DC Summary)   AMBULATORY STATUS COMMUNICATION OF NEEDS Skin   Extensive Assist Verbally PU Stage and Appropriate Care, Other (Comment)(Stage II on coccyx; wound on leg)                       Personal Care Assistance Level of Assistance  Bathing, Feeding, Dressing Bathing Assistance: Maximum assistance Feeding assistance: Limited assistance Dressing Assistance: Limited assistance     Functional Limitations Info  Sight, Hearing, Speech Sight Info: Adequate Hearing Info: Adequate Speech Info: Adequate    SPECIAL CARE FACTORS FREQUENCY  PT (By licensed PT), OT (By licensed OT)     PT Frequency: 5x/week OT Frequency: 3x/week            Contractures      Additional Factors Info  Code Status, Allergies, Insulin Sliding Scale Code Status Info: DNR Allergies Info:  Penicillins, Diphenoxylate-atropine, Latex   Insulin Sliding Scale Info: 3x daily with meals       Current Medications (12/04/2017):  This is the current hospital active medication list Current Facility-Administered Medications  Medication Dose Route Frequency Provider Last Rate Last Dose  . atenolol (TENORMIN) tablet 25 mg  25 mg Oral QHS Emokpae, Ejiroghene E, MD   25 mg at 12/03/17 2237  . atenolol (TENORMIN) tablet 50 mg  50 mg Oral Daily Emokpae, Ejiroghene E, MD   50 mg at 12/04/17 0817  . atorvastatin (LIPITOR) tablet 20 mg  20 mg Oral q1800 Emokpae, Ejiroghene E, MD   20 mg at 12/03/17 1759  . carbidopa-levodopa (SINEMET IR) 25-100 MG per tablet immediate release 2 tablet  2 tablet  Oral TID WC Emokpae, Ejiroghene E, MD   2 tablet at 12/04/17 0816  . cefTRIAXone (ROCEPHIN) 2 g in sodium chloride 0.9 % 100 mL IVPB  2 g Intravenous Q24H Emokpae, Ejiroghene E, MD   Stopped at 12/03/17 2312  . digoxin (LANOXIN) tablet 0.125 mg  0.125 mg Oral Once per day on Mon Tue Thu Fri Sat Emokpae, Ejiroghene E, MD   0.125 mg at 12/04/17 1610  . haloperidol lactate (HALDOL) injection 2 mg  2 mg Intravenous Q6H PRN Audrea Muscat T, NP    2 mg at 12/02/17 0534  . hydrALAZINE (APRESOLINE) injection 5 mg  5 mg Intravenous Q4H PRN Emokpae, Ejiroghene E, MD   5 mg at 12/04/17 0456  . HYDROcodone-acetaminophen (NORCO/VICODIN) 5-325 MG per tablet 1 tablet  1 tablet Oral Q6H PRN Emokpae, Ejiroghene E, MD   1 tablet at 12/04/17 0658  . insulin aspart (novoLOG) injection 0-9 Units  0-9 Units Subcutaneous TID WC Emokpae, Ejiroghene E, MD   1 Units at 12/04/17 0813  . lactose free nutrition (BOOST PLUS) liquid 237 mL  237 mL Oral BID BM Edsel Petrin, DO   237 mL at 12/04/17 9604  . sodium chloride 0.9 % bolus 500 mL  500 mL Intravenous Once Schorr, Roma Kayser, NP      . Warfarin - Pharmacist Dosing Inpatient   Does not apply q1800 Juliette Mangle, [facility]         Discharge Medications: Please see discharge summary for a list of discharge medications.  Relevant Imaging Results:  Relevant Lab Results:   Additional Information SSN: [id number] [place], [place]

## 2017-12-04 NOTE — Care Management Important Message (Signed)
Important Message  Patient Details  Name: Leah [name] MRN: [id number] Date of Birth: 11/06/1934   Medicare Important Message Given:  Yes    [name] 12/04/2017, 3:01 PM

## 2017-12-04 NOTE — Progress Notes (Signed)
Chanda BusingMennie S Penning to be D/C'd Skilled nursing facility per MD order.  Discussed with the patient and all questions fully answered.  VSS, Skin clean, dry and intact without evidence of skin break down, no evidence of skin tears noted. IV catheter discontinued intact. Site without signs and symptoms of complications. Dressing and pressure applied.  An After Visit Summary was printed and given to the patient. Patient received prescription.  D/c education completed with patient/family including follow up instructions, medication list, d/c activities limitations if indicated, with other d/c instructions as indicated by MD - patient able to verbalize understanding, all questions fully answered.   Patient instructed to return to ED, call 911, or call MD for any changes in condition.   Patient escorted via stretcher, and D/C [facility] via PTAR.  Marca AnconaLaura M Price [place]elle 12/04/2017 4:29 PM

## 2017-12-04 NOTE — Care Management Important Message (Signed)
Important Message  Patient Details  Name: Leah [name] MRN: [id number] Date of Birth: 07/11/1935   Medicare Important Message Given:  Yes    Shyan Scalisi 12/04/2017, 3:01 PM 

## 2017-12-04 NOTE — Care Management Important Message (Signed)
Important Message  Patient Details  Name: Leah [name] MRN: [id number] Date of Birth: 10/09/1934   Medicare Important Message Given:  Yes    Siobhan Zaro Stefan ChurchBratton 12/04/2017, 2:59 PM

## 2017-12-04 NOTE — Progress Notes (Signed)
Patient will DC to: [facility] Anticipated DC date: 12/04/17 Family notified: Daughter Transport by: PTAR 4:30pm   Per MD [facility]. RN, patient, patient's family, and facility notified of DC. Discharge Summary sent to facility. RN given number for report [phone] Room [phone]). DC packet on chart. Ambulance transport requested for patient.   CSW signing off.  Cristobal GoldmannNadia Monseratt Ledin, LCSW Clinical Social Worker [phone]

## 2017-12-04 NOTE — Clinical Social Work Note (Signed)
Clinical Social Work Assessment  Patient Details  Name: Leah [name] MRN: [id number] Date of Birth: 07/20/1935  Date of referral:  12/04/17               Reason for consult:  Facility Placement                Permission sought to share information with:  Facility Medical sales representativeContact Representative, Family Supports Permission granted to share information::  Yes, Verbal Permission Granted  Name::     Leah [name]  Agency::  SNFs  Relationship::  Daughter  Contact Information:  [phone]  Housing/Transportation Living arrangements for the past 2 months:  Single Family Home Source of Information:  Adult Children Patient Interpreter Needed:  None Criminal Activity/Legal Involvement Pertinent to Current Situation/Hospitalization:  No - Comment as needed Significant Relationships:  Adult Children, Other Family Members Lives with:  Relatives(Grandaughter) Do you feel safe going back to the place where you live?  No Need for family participation in patient care:  Yes (Comment)  Care giving concerns:  CSW received consult for possible SNF placement at time of discharge. Patient is disoriented. CSW spoke with patient's daughter, Leah [name], regarding PT recommendation of SNF placement at time of discharge. Patient's daughter reported that she would love for patient to return home with her and her daughter, but she would like patient to get a little stronger first. Patient's daughter expressed understanding of PT recommendation and is agreeable to SNF placement at time of discharge. CSW to continue to follow and assist with discharge planning needs.   Social Worker assessment / plan:  CSW spoke with patient's daughter concerning [facility] before returning home.  Employment status:  Retired Database administratornsurance information:  Managed Medicare PT Recommendations:  Skilled Nursing Facility Information / Referral to community resources:  Skilled Nursing Facility  Patient/Family's Response to care:   Patient's daughter recognizes need for rehab before returning home and [place]. [facility] since [facility] has [facility] a few years ago. CSW explained insurance pre-authorization process. Patient will need PTAR. Patient's daughter can complete admission paperwork.  Patient/Family's Understanding of and Emotional Response to Diagnosis, Current Treatment, and Prognosis:  Patient/family is realistic regarding therapy needs and expressed being hopeful for SNF placement. Patient's daughter expressed understanding of CSW role and discharge process as well as medical condition. No questions/concerns about plan or treatment.    Emotional Assessment Appearance:  Appears stated age Attitude/Demeanor/Rapport:  Unable to Assess Affect (typically observed):  Unable to Assess Orientation:  Oriented to Self, Oriented to  Time Alcohol / Substance use:  Not Applicable Psych involvement (Current and /or in the community):  No (Comment)  Discharge Needs  Concerns to be addressed:  Care Coordination Readmission within the last 30 days:  No Current discharge risk:  None Barriers to Discharge:  No Barriers Identified   Mearl Latinadia S Anala Whisenant, LCSWA 12/04/2017, 11:34 AM

## 2017-12-04 NOTE — Progress Notes (Signed)
[facility].

## 2017-12-04 NOTE — Discharge Summary (Signed)
Physician Discharge Summary  Leah [name] ZOX:[id number] DOB: [birth year] DOA: 11/30/2017  PCP: [name], MD  Admit date: 11/30/2017 Discharge date: 12/04/2017  Admitted From: Home Disposition:  SNF  Recommendations for Outpatient Follow-up:  1. Follow up with nursing home provider at earliest convenience with repeat CBC/BMP/INR 2. Follow-up with wound care as an outpatient 3. Fall precautions   Home Health: No Equipment/Devices: None  Discharge Condition: Stable CODE STATUS: DNR Diet recommendation: Heart Healthy / Carb Modified    Brief/Interim Summary: 83 year old female with history of atrial fibrillation/flutter status post permanent pacemaker, diastolic CHF, hypertension, Parkinson's disease presented with worsening bilateral leg pain, swelling and redness for a month not getting better with outpatient oral antibiotics.  She was admitted with suspected lower extremity cellulitis and started on Rocephin.  Also found to have UTI.  Urine cultures grew Proteus.  Her condition has improved.  PT recommended nursing home placement.  Discharge to nursing home once bed is available.  Discharge Diagnoses:  Principal Problem:   Cellulitis Active Problems:   HYPERTENSION, BENIGN   Atrial fibrillation (HCC)   Chronic diastolic heart failure (HCC)   DM (diabetes mellitus), type 2 (HCC)   DNR (do not resuscitate)   Parkinson disease (HCC)   History of recurrent UTIs   Pressure injury of skin   ?Nonpurulent cellulitis of the lower extremities - likely venous insufficiency -Currently on Rocephin: Blood cultures negative till date.  Switch to oral Keflex for 5 more days -Suspect this is due to chronic venous stasis -Wound care consulted- and feels patient's skin changes are due to venous insufficiency.  Outpatient follow-up with wound care -LLE doppler negative for DVT  Acute metabolic encephalopathy vs acute delirium  -likely due to infection, UTI -appears to be  improved -continue monitoring mental status  Acute kidney injury -Baseline creatinine approximately 1.1-1.3, creatinine 2.14 on admission -Creatinine improved to 1.49 -HCTZ and lisinopril held -Follow-up BMP as an outpatient  Urinary tract infection -UA: Many bacteria, TNTC WBC, large leukocytes, positive nitrites -Urine culture >100K proteus mirabilis  -Currently on ceftriaxone.  Antibiotic plan as above  Atrial flutter/fibrillation -Patient does have pacemaker -Patient presented with supratherapeutic INR which has resolved. -INR 1.8 today.  Continue Coumadin.  Outpatient INR follow-up  Chronic diastolic heart failure -Currently appears to be euvolemic and compensated -No recent echocardiogram -HCTZ held -Outpatient follow-up  Diabetes mellitus, type II -Continue Lantus.  Oral medications will remain on hold.  Outpatient follow-up  Essential hypertension -Lisinopril and HCTZ held due to acute kidney injury -Continue atenolol.  Parkinson's disease -Appears stable -Patient states she ambulates with the use of a walker at baseline -Continue Sinemet -PT recommended SNF -Outpatient follow-up   Discharge Instructions  Discharge Instructions    Call MD for:  difficulty breathing, headache or visual disturbances   Complete by:  As directed    Call MD for:  extreme fatigue   Complete by:  As directed    Call MD for:  hives   Complete by:  As directed    Call MD for:  persistant dizziness or light-headedness   Complete by:  As directed    Call MD for:  persistant nausea and vomiting   Complete by:  As directed    Call MD for:  severe uncontrolled pain   Complete by:  As directed    Call MD for:  temperature >100.4   Complete by:  As directed    Diet - low sodium heart healthy   Complete by:  As  directed    Diet Carb Modified   Complete by:  As directed    Discharge instructions   Complete by:  As directed    Fall precautions Outpatient follow-up with wound  care   Increase activity slowly   Complete by:  As directed      Allergies as of 12/04/2017      Reactions   Penicillins Hives, Itching   Has patient had a PCN reaction causing immediate rash, facial/tongue/throat swelling, SOB or lightheadedness with hypotension: Yes Has patient had a PCN reaction causing severe rash involving mucus membranes or skin necrosis: No Has patient had a PCN reaction that required hospitalization: No Has patient had a PCN reaction occurring within the last 10 years: No If all of the above answers are "NO", then may proceed with Cephalosporin use.   Diphenoxylate-atropine Other (See Comments)   Unknown reaction   Latex Itching, Rash      Medication List    STOP taking these medications   doxycycline 100 MG tablet Commonly known as:  VIBRA-TABS   glimepiride 4 MG tablet Commonly known as:  AMARYL   hydrochlorothiazide 25 MG tablet Commonly known as:  HYDRODIURIL   lisinopril 40 MG tablet Commonly known as:  PRINIVIL,ZESTRIL   metFORMIN 1000 MG tablet Commonly known as:  GLUCOPHAGE     TAKE these medications   atenolol 25 MG tablet Commonly known as:  TENORMIN Take 25-50 mg by mouth See admin instructions. Take 50 mg by mouth in the morning and 25 mg bedtime   atorvastatin 20 MG tablet Commonly known as:  LIPITOR Take 20 mg by mouth daily at 6 PM.   carbidopa-levodopa 25-100 MG tablet Commonly known as:  SINEMET IR Take 2 tablets by mouth 3 (three) times daily. What changed:  when to take this   CENTRUM SILVER 50+WOMEN Tabs Take 1 tablet by mouth daily. What changed:  Another medication with the same name was removed. Continue taking this medication, and follow the directions you see here.   cephALEXin 250 MG capsule Commonly known as:  KEFLEX Take 1 capsule (250 mg total) by mouth 2 (two) times daily for 5 days. What changed:  when to take this   cyanocobalamin 2000 MCG tablet Take 2,000 mcg by mouth daily.   digoxin 0.125 MG  tablet Commonly known as:  LANOXIN Take one tablet by mouth daily except for Wednesday and Sunday. What changed:    how much to take  how to take this  when to take this  additional instructions   fish oil-omega-3 fatty acids 1000 MG capsule Take 2 g by mouth at bedtime.   FOLTANX PO Take 1 tablet by mouth every evening.   HYDROcodone-acetaminophen 5-325 MG tablet Commonly known as:  NORCO/VICODIN Take 1 tablet by mouth 2 (two) times daily as needed for moderate pain.   insulin glargine 100 UNIT/ML injection Commonly known as:  LANTUS Inject 0.06 mLs (6 Units total) into the skin 2 (two) times daily. What changed:  when to take this   Vitamin D-3 1000 units Caps Take 2,000 Units by mouth daily.   warfarin 5 MG tablet Commonly known as:  COUMADIN Take 5 mg by mouth at bedtime.      Follow-up Information    [name], MD. Schedule an appointment as soon as possible for a visit in 1 week(s).   Specialty:  Internal Medicine Why:  with repeat CBC/BMP/INR Contact information: [address] [place] [address] [phone]  Allergies  Allergen Reactions  . Penicillins Hives and Itching    Has patient had a PCN reaction causing immediate rash, facial/tongue/throat swelling, SOB or lightheadedness with hypotension: Yes Has patient had a PCN reaction causing severe rash involving mucus membranes or skin necrosis: No Has patient had a PCN reaction that required hospitalization: No Has patient had a PCN reaction occurring within the last 10 years: No If all of the above answers are "NO", then may proceed with Cephalosporin use.  . Diphenoxylate-Atropine Other (See Comments)    Unknown reaction  . Latex Itching and Rash    Consultations:  None   Procedures/Studies:  No results found. (Echo, Carotid, EGD, Colonoscopy, ERCP)    Subjective: Patient seen and examined at bedside.  She feels that her lower extremity swelling and pain is  improving.  No overnight fever or vomiting.  Discharge Exam: Vitals:   12/04/17 0524 12/04/17 0816  BP: (!) 153/51 (!) 157/53  Pulse:  63  Resp:    Temp:    SpO2:     Vitals:   12/03/17 2218 12/04/17 0437 12/04/17 0524 12/04/17 0816  BP: (!) 153/47 (!) 194/79 (!) 153/51 (!) 157/53  Pulse: 60 (!) 58  63  Resp: (!) 24 18    Temp: 98.7 F (37.1 C) 98.4 F (36.9 C)    TempSrc: Oral Oral    SpO2: 97% 100%    Weight:      Height:        General: Pt is alert, awake, not in acute distress Cardiovascular: Rate controlled, S1/S2 + Respiratory: Bilateral decreased breath sounds at bases Abdominal: Soft, NT, ND, bowel sounds + Extremities: Bilateral lower extreme it is a wrapped ; trace edema, no cyanosis    The results of significant diagnostics from this hospitalization (including imaging, microbiology, ancillary and laboratory) are listed below for reference.     Microbiology: Recent Results (from the past 240 hour(s))  Urine culture     Status: Abnormal   Collection Time: 11/30/17  6:19 PM  Result Value Ref Range Status   Specimen Description URINE, RANDOM  Final   Special Requests   Final    NONE Performed at [facility], [address], [place], [place] [address]    Culture >=100,000 COLONIES/mL PROTEUS MIRABILIS (A)  Final   Report Status 12/03/2017 FINAL  Final   Organism ID, Bacteria PROTEUS MIRABILIS (A)  Final      Susceptibility   Proteus mirabilis - MIC*    AMPICILLIN <=2 SENSITIVE Sensitive     CEFAZOLIN <=4 SENSITIVE Sensitive     CEFTRIAXONE <=1 SENSITIVE Sensitive     CIPROFLOXACIN <=0.25 SENSITIVE Sensitive     GENTAMICIN <=1 SENSITIVE Sensitive     IMIPENEM 1 SENSITIVE Sensitive     NITROFURANTOIN 128 RESISTANT Resistant     TRIMETH/SULFA <=20 SENSITIVE Sensitive     AMPICILLIN/SULBACTAM <=2 SENSITIVE Sensitive     PIP/TAZO <=4 SENSITIVE Sensitive     * >=100,000 COLONIES/mL PROTEUS MIRABILIS  Blood Culture (routine x 2)     Status: None  (Preliminary result)   Collection Time: 11/30/17  8:09 PM  Result Value Ref Range Status   Specimen Description BLOOD RIGHT ANTECUBITAL  Final   Special Requests   Final    Blood Culture adequate volume BOTTLES DRAWN AEROBIC AND ANAEROBIC   Culture   Final    NO GROWTH 3 DAYS Performed at [facility], [address], [place], [place] [address]  Report Status PENDING  Incomplete  Blood Culture (routine x 2)     Status: None (Preliminary result)   Collection Time: 11/30/17  8:14 PM  Result Value Ref Range Status   Specimen Description BLOOD LEFT FOREARM  Final   Special Requests   Final    Blood Culture adequate volume BOTTLES DRAWN AEROBIC AND ANAEROBIC   Culture   Final    NO GROWTH 3 DAYS Performed at [facility], [address] [place], [place] [address]    Report Status PENDING  Incomplete     Labs: BNP (last 3 results) No results for input(s): BNP in the last 8760 hours. Basic Metabolic Panel: Recent Labs  Lab 11/30/17 1800 12/01/17 0458 12/02/17 0721 12/04/17 0733  NA 142 143 141 140  K 5.1 4.3 4.4 4.1  CL 111 115* 112* 111  CO2 19* 19* 17* 19*  GLUCOSE 67 44* 118* 117*  BUN 51* 48* 37* 29*  CREATININE 2.14* 2.00* 1.63* 1.49*  CALCIUM 9.2 8.3* 8.3* 8.5*   Liver Function Tests: Recent Labs  Lab 11/30/17 1800  AST 26  ALT 5*  ALKPHOS 87  BILITOT 0.4  PROT 6.9  ALBUMIN 2.8*   No results for input(s): LIPASE, AMYLASE in the last 168 hours. No results for input(s): AMMONIA in the last 168 hours. CBC: Recent Labs  Lab 11/30/17 1800 12/01/17 0458 12/02/17 0721 12/03/17 0631 12/04/17 0733  WBC 16.5* 12.4* 14.5* 14.2* 13.5*  NEUTROABS 12.1*  --   --   --   --   HGB 11.6* 10.1* 10.9* 10.6* 10.7*  HCT 37.4 33.2* 35.1* 33.3* 34.1*  MCV 88.8 88.5 87.8 87.6 87.4  PLT 371 302 265 279 264   Cardiac Enzymes: No results for input(s): CKTOTAL, CKMB, CKMBINDEX, TROPONINI in the last 168 hours. BNP: Invalid input(s): POCBNP CBG: Recent Labs   Lab 12/03/17 0913 12/03/17 1203 12/03/17 1618 12/03/17 2219 12/04/17 0753  GLUCAP 289* 294* 200* 125* 123*   D-Dimer No results for input(s): DDIMER in the last 72 hours. Hgb A1c No results for input(s): HGBA1C in the last 72 hours. Lipid Profile No results for input(s): CHOL, HDL, LDLCALC, TRIG, CHOLHDL, LDLDIRECT in the last 72 hours. Thyroid function studies No results for input(s): TSH, T4TOTAL, T3FREE, THYROIDAB in the last 72 hours.  Invalid input(s): FREET3 Anemia work up No results for input(s): VITAMINB12, FOLATE, FERRITIN, TIBC, IRON, RETICCTPCT in the last 72 hours. Urinalysis    Component Value Date/Time   COLORURINE YELLOW 11/30/2017 1819   APPEARANCEUR TURBID (A) 11/30/2017 1819   LABSPEC 1.015 11/30/2017 1819   PHURINE 8.5 (H) 11/30/2017 1819   GLUCOSEU NEGATIVE 11/30/2017 1819   HGBUR SMALL (A) 11/30/2017 1819   BILIRUBINUR NEGATIVE 11/30/2017 1819   KETONESUR NEGATIVE 11/30/2017 1819   PROTEINUR 100 (A) 11/30/2017 1819   UROBILINOGEN 0.2 11/29/2014 1404   NITRITE POSITIVE (A) 11/30/2017 1819   LEUKOCYTESUR LARGE (A) 11/30/2017 1819   Sepsis Labs Invalid input(s): PROCALCITONIN,  WBC,  LACTICIDVEN Microbiology Recent Results (from the past 240 hour(s))  Urine culture     Status: Abnormal   Collection Time: 11/30/17  6:19 PM  Result Value Ref Range Status   Specimen Description URINE, RANDOM  Final   Special Requests   Final    NONE Performed at [facility], [address] [place], [place] [address]    Culture >=100,000 COLONIES/mL PROTEUS MIRABILIS (A)  Final   Report Status 12/03/2017 FINAL  Final   Organism ID,  Bacteria PROTEUS MIRABILIS (A)  Final      Susceptibility   Proteus mirabilis - MIC*    AMPICILLIN <=2 SENSITIVE Sensitive     CEFAZOLIN <=4 SENSITIVE Sensitive     CEFTRIAXONE <=1 SENSITIVE Sensitive     CIPROFLOXACIN <=0.25 SENSITIVE Sensitive     GENTAMICIN <=1 SENSITIVE Sensitive     IMIPENEM 1 SENSITIVE Sensitive      NITROFURANTOIN 128 RESISTANT Resistant     TRIMETH/SULFA <=20 SENSITIVE Sensitive     AMPICILLIN/SULBACTAM <=2 SENSITIVE Sensitive     PIP/TAZO <=4 SENSITIVE Sensitive     * >=100,000 COLONIES/mL PROTEUS MIRABILIS  Blood Culture (routine x 2)     Status: None (Preliminary result)   Collection Time: 11/30/17  8:09 PM  Result Value Ref Range Status   Specimen Description BLOOD RIGHT ANTECUBITAL  Final   Special Requests   Final    Blood Culture adequate volume BOTTLES DRAWN AEROBIC AND ANAEROBIC   Culture   Final    NO GROWTH 3 DAYS Performed at [facility], [address], [place], [place] [address]    Report Status PENDING  Incomplete  Blood Culture (routine x 2)     Status: None (Preliminary result)   Collection Time: 11/30/17  8:14 PM  Result Value Ref Range Status   Specimen Description BLOOD LEFT FOREARM  Final   Special Requests   Final    Blood Culture adequate volume BOTTLES DRAWN AEROBIC AND ANAEROBIC   Culture   Final    NO GROWTH 3 DAYS Performed at [facility], [address] [place], [place] [address]    Report Status PENDING  Incomplete     Time coordinating discharge: 40 minutes  SIGNED:   Glade Lloyd, MD  [facility] 12/04/2017, 11:00 AM Pager: [phone]  If 7PM-7AM, please contact night-coverage [email or web address] Password [email or web address]

## 2017-12-04 NOTE — Clinical Social Work Placement (Signed)
   CLINICAL SOCIAL WORK PLACEMENT  NOTE  Date:  12/04/2017  Patient Details  Name: Leah [name] MRN: [id number] Date of Birth: 02/26/1935  Clinical Social Work is seeking post-discharge placement for this patient at the Skilled  Nursing Facility level of care (*CSW will initial, date and re-position this form in  chart as items are completed):  Yes   Patient/family provided with [facility] Clinical Social Work Department's list of facilities offering this level of care within the geographic area requested by the patient (or if unable, by the patient's family).  Yes   Patient/family informed of their freedom to choose among providers that offer the needed level of care, that participate in Medicare, Medicaid or managed care program needed by the patient, have an available bed and are willing to accept the patient.  Yes   Patient/family informed of [facility]'s ownership interest in [facility] and [facility], as well as of the fact that they are under no obligation to receive care at these facilities.  PASRR submitted to EDS on       PASRR number received on       Existing PASRR number confirmed on 12/04/17     FL2 transmitted to all facilities in geographic area requested by pt/family on 12/04/17     FL2 transmitted to all facilities within larger geographic area on       Patient informed that his/her managed care company has contracts with or will negotiate with certain facilities, including the following:        Yes   Patient/family informed of bed offers received.  Patient chooses bed at [facility]     Physician recommends and patient chooses bed at      Patient to be transferred to [facility] on 12/04/17.  Patient to be transferred to facility by [facility]     Patient family notified on 12/04/17 of transfer.  Name of family member notified:  Daughter, [name]     PHYSICIAN       Additional Comment:     _______________________________________________ [name], LCSWA 12/04/2017, 11:37 AM

## 2017-12-04 NOTE — Progress Notes (Signed)
Physical Therapy Treatment Patient Details Name: Leah [name] MRN: [id number] DOB: 08/11/1935 Today's Date: 12/04/2017    History of Present Illness Pt adm with BLE cellulitis and developed acute metabolic encephalopathy vs delirium. Pt also with UTI. PMH - Atria fib/flutter PPM status, Diastolic CHF, HTN, parkinsons, DM    PT Comments    Patient progressing to stand fully this session and much improved with her shoes on as opposed to slipper socks.  Continues with deconditioning and needs more opportunities to mobilize so continue to recommend SNF level rehab at d/c.   Follow Up Recommendations  SNF     Equipment Recommendations  None recommended by PT    Recommendations for Other Services       Precautions / Restrictions Precautions Precautions: Fall    Mobility  Bed Mobility Overal bed mobility: Needs Assistance Bed Mobility: Supine to Sit;Sit to Supine     Supine to sit: HOB elevated;Mod assist;+2 for physical assistance Sit to supine: +2 for physical assistance;Max assist   General bed mobility comments: slowly brought legs to EOB and assist for hips and trunk; to supine assist for legs and trunk  Transfers Overall transfer level: Needs assistance Equipment used: Rolling walker (2 wheeled)(stedy) Transfers: Sit to/from Stand Sit to Stand: +2 physical assistance;Max assist         General transfer comment: lifting help for several attempts (maybe 5) initially with RW, then with stedy, then with her shoes and stedy and able to achieve standing, but pt could not sustain, nor bring hips up enough to get stedy seat down  Ambulation/Gait                 Stairs            Wheelchair Mobility    Modified Rankin (Stroke Patients Only)       Balance Overall balance assessment: Needs assistance Sitting-balance support: Bilateral upper extremity supported;Feet supported Sitting balance-Leahy Scale: Poor Sitting balance - Comments: S and cues  for posture in sitting at EOB Postural control: Posterior lean;Right lateral lean Standing balance support: Bilateral upper extremity supported Standing balance-Leahy Scale: Zero Standing balance comment: heavy support to maintain standing                            Cognition Arousal/Alertness: Awake/alert Behavior During Therapy: WFL for tasks assessed/performed   Area of Impairment: Safety/judgement                   Current Attention Level: Selective     Safety/Judgement: Decreased awareness of deficits   Problem Solving: Decreased initiation;Requires verbal cues        Exercises General Exercises - Lower Extremity Ankle Circles/Pumps: AROM;10 reps;Supine;Both Heel Slides: AROM;AAROM;10 reps;Both;Supine    General Comments        Pertinent Vitals/Pain Faces Pain Scale: Hurts even more Pain Location: back Pain Descriptors / Indicators: Grimacing;Guarding Pain Intervention(s): Repositioned;Monitored during session    Home Living                      Prior Function            PT Goals (current goals can now be found in the care plan section) Progress towards PT goals: Progressing toward goals    Frequency           PT Plan      Co-evaluation  AM-PAC PT "6 Clicks" Daily Activity  Outcome Measure  Difficulty turning over in bed (including adjusting bedclothes, sheets and blankets)?: Unable Difficulty moving from lying on back to sitting on the side of the bed? : Unable Difficulty sitting down on and standing up from a chair with arms (e.g., wheelchair, bedside commode, etc,.)?: Unable Help needed moving to and from a bed to chair (including a wheelchair)?: Total Help needed walking in hospital room?: Total Help needed climbing 3-5 steps with a railing? : Total 6 Click Score: 6    End of Session Equipment Utilized During Treatment: Gait belt Activity Tolerance: Patient limited by fatigue Patient left:  in bed;with call bell/phone within reach;with family/visitor present         Time: 4098-1191 PT Time Calculation (min) (ACUTE ONLY): 36 min  Charges:  $Therapeutic Activity: 23-37 mins                    G CodesSheran Lawless, [place] [phone] 12/04/2017    Elray Mcgregor 12/04/2017, 5:21 PM

## 2017-12-05 ENCOUNTER — Encounter: Payer: Self-pay | Admitting: Internal Medicine

## 2017-12-05 ENCOUNTER — Non-Acute Institutional Stay (SKILLED_NURSING_FACILITY): Payer: Medicare Other | Admitting: Internal Medicine

## 2017-12-05 DIAGNOSIS — Z794 Long term (current) use of insulin: Secondary | ICD-10-CM

## 2017-12-05 DIAGNOSIS — N390 Urinary tract infection, site not specified: Secondary | ICD-10-CM

## 2017-12-05 DIAGNOSIS — I480 Paroxysmal atrial fibrillation: Secondary | ICD-10-CM | POA: Diagnosis not present

## 2017-12-05 DIAGNOSIS — L03119 Cellulitis of unspecified part of limb: Secondary | ICD-10-CM

## 2017-12-05 DIAGNOSIS — E0821 Diabetes mellitus due to underlying condition with diabetic nephropathy: Secondary | ICD-10-CM | POA: Diagnosis not present

## 2017-12-05 DIAGNOSIS — B964 Proteus (mirabilis) (morganii) as the cause of diseases classified elsewhere: Secondary | ICD-10-CM | POA: Insufficient documentation

## 2017-12-05 LAB — CULTURE, BLOOD (ROUTINE X 2)
CULTURE: NO GROWTH
Culture: NO GROWTH
Special Requests: ADEQUATE
Special Requests: ADEQUATE

## 2017-12-05 NOTE — Assessment & Plan Note (Addendum)
Pan sensitive except to nitrofurantoin Complete Keflex and monitor for any adverse reaction because of penicillin allergy history

## 2017-12-05 NOTE — Assessment & Plan Note (Addendum)
Keflex until 3/28 Wound Care Nurse  monitor

## 2017-12-05 NOTE — Assessment & Plan Note (Addendum)
Verify Hgb A1c status with Dr Perini's office; ? 7% in January 2019 Decrease Lantus to once daily, by history she's been on this dose since August 2018 Monitor glucoses

## 2017-12-05 NOTE — Patient Instructions (Signed)
See assessment and plan under each diagnosis in the problem list and acutely for this visit 

## 2017-12-05 NOTE — Progress Notes (Signed)
NURSING HOME LOCATION:  [facility] ROOM NUMBER:  212-A  CODE STATUS:  DNR  PCP:  Rodrigo RanPerini, Mark, MD  [address] [place] [id number]   This is a comprehensive admission note to [facility] performed on this date less than 30 days from date of admission. Included are preadmission medical/surgical history;reconciled medication list; family history; social history and comprehensive review of systems.  Corrections and additions to the records were documented. Comprehensive physical exam was also performed. Additionally a clinical summary was entered for each active diagnosis pertinent to this admission in the Problem List to enhance continuity of care.  HPI: The patient was hospitalized 3/21-3/25/19 for progressive cellulitis of the lower extremities for over a month, unresponsive to outpatient oral antibiotics. Rocephin was initiated; she was found to have a Proteus UTI  PT recommended nursing home placement for rehabilitation. Discharge labs revealed glucose 117, creatinine 1.49, BUN 29, and GFR of 32. She exhibited a stable normochromic, normocytic anemia with hemoglobin 10.7 and hematocrit 34.1. At discharge PT/INR was 1.8. The last A1c on record in Epic was 7.6% on 12/11/16.   Past medical and surgical history: Includes A. fib/flutter status post permanent pacemaker, diastolic congestive heart failure, dyslipidemia, hypertension, insulin-dependent diabetes and Parkinson's. Surgeries and procedures include pacemaker insertion and cholecystectomy.  Social history: Nonsmoker and nondrinker  Family history: Reviewed   Review of systems:  Date given as 11/2017; but the location as "[place]". Clock drawing could not be completed. She was able to recall only one of 3 items. Despite this she did give a history which appeared valid. She states that she has been on Lantus 6 units daily since August 2018. She states that sugars in the morning are typically 120-150. She has noted  lows down to 72. She states that the A1c was 7% in January of this year. For 5 years she describes frequency, polyuria, and incontinence. She also states she has recurrent UTIs.  Constitutional: No fever,significant weight change, fatigue  Eyes: No redness, discharge, pain, vision change ENT/mouth: No nasal congestion, purulent discharge, earache, change in hearing, sore throat  Cardiovascular: No chest pain, palpitations, paroxysmal nocturnal dyspnea, claudication, edema  Respiratory: No cough, sputum production, hemoptysis, DOE, significant snoring, apnea Gastrointestinal: No heartburn, dysphagia, abdominal pain, nausea /vomiting, rectal bleeding, melena, change in bowels Genitourinary: No dysuria, hematuria, pyuria @ this time Musculoskeletal: No joint stiffness, joint swelling, weakness, pain Dermatologic: No rash, pruritus, change in appearance of skin Neurologic: No dizziness, headache, syncope, seizures, numbness, tingling Psychiatric: No significant anxiety, depression, insomnia, anorexia Endocrine: No change in hair/skin/ nails, excessive thirst, excessive hunger, Hematologic/lymphatic: No significant bruising, lymphadenopathy, abnormal bleeding Allergy/immunology: No itchy/watery eyes, significant sneezing, urticaria, angioedema  Physical exam:  Pertinent or positive findings: Her affect is flat and she speaks in a monotone. Facies are blank. She stares straight ahead. Pupils are small but did exhibit some variability. There is asymmetry of nasolabial folds she has an upper and lower partial. She keeps her head tilted forward with the neck fixed. The cervical musculature is under tension, particularly on the right. There was no definite torticollis. There is accentuation of the upper thoracic spine. Pacemaker present right upper chest. Grade 1 systolic murmur is  present at the base. The second heart sound is increased. Breath sounds are decreased. Abdomen is massive. The lower extremities  are wrapped. Pedal pulses are decreased. Slight clubbing of the nailbeds is suggested. She has bruising greater in the right upper extremity than the left. She  has 2 closed lacerations over the dorsum of the left hand. There is lateral deviation of the MCP he joints of the left hand. There is muscular atrophy of upper extremities with dramatic excess subcutaneous tissues with marked laxity. The tissues of the upper extremity are almost flaccid as to tone. Tremor present in L hand.  General appearance: Adequately nourished; no acute distress, increased work of breathing is present.   Lymphatic: No lymphadenopathy about the head, neck, axilla. Eyes: No conjunctival inflammation or lid edema is present. There is no scleral icterus. Ears:  External ear exam shows no significant lesions or deformities.   Nose:  External nasal examination shows no deformity or inflammation. Nasal mucosa are pink and moist without lesions, exudates Oral exam: Lips and gums are healthy appearing.There is no oropharyngeal erythema or exudate. Neck:  No thyromegaly, masses, tenderness noted.    Heart:  Normal rate and regular rhythm. S1 normal without gallop, click, rub .  Lungs: without wheezes, rhonchi, rales, rubs. Abdomen: Bowel sounds are normal.  Abdomen is soft and nontender with no organomegaly, hernias, masses. GU: Deferred  Extremities:  No cyanosis Neurologic exam:   Balance, Rhomberg, finger to nose testing could not be completed due to clinical state Skin: Warm & dry w/o tenting. No significant rash.  See clinical summary under each active problem in the Problem List with associated updated therapeutic plan

## 2017-12-05 NOTE — Assessment & Plan Note (Deleted)
Monitor PT/INR

## 2017-12-05 NOTE — Assessment & Plan Note (Signed)
12/05/17 clinically her rhythm is regular; EKG 3/22 revealed a sinus rhythm according to report. EKG was reviewed, I could not deftly define P waves Monitor PT/INR

## 2017-12-08 LAB — POCT INR: INR: 1.9 — AB (ref 0.9–1.1)

## 2017-12-08 LAB — PROTIME-INR: Protime: 21.3 — AB (ref 10.0–13.8)

## 2017-12-13 LAB — POCT INR: INR: 4 — AB (ref 0.9–1.1)

## 2017-12-13 LAB — PROTIME-INR: Protime: 38 — AB (ref 10.0–13.8)

## 2017-12-16 IMAGING — DX DG CHEST 2V
3 series · 3 of 3 positions shown · non-contrast
Comparison: 12/04/2014 CXR

CLINICAL DATA: Lower extremity hip and knee pain with swelling.
Atelectasis. Aortic atherosclerosis.

EXAM:
CHEST  2 VIEW

[chest lat (1 of 2)]
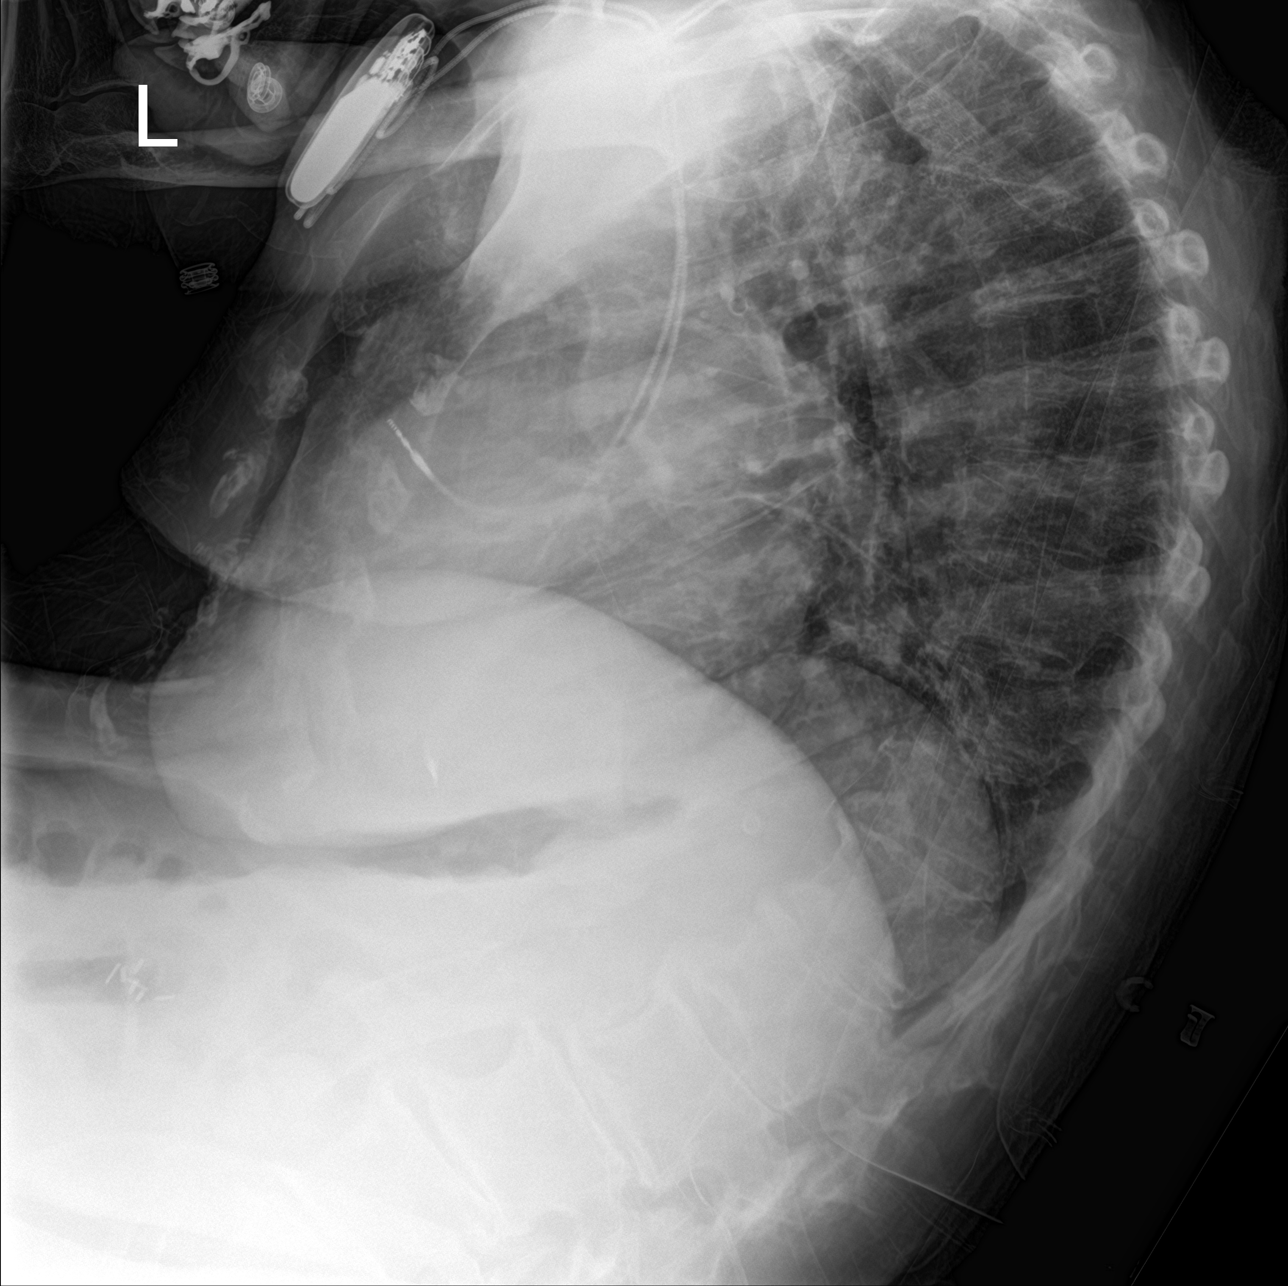

[chest ap]
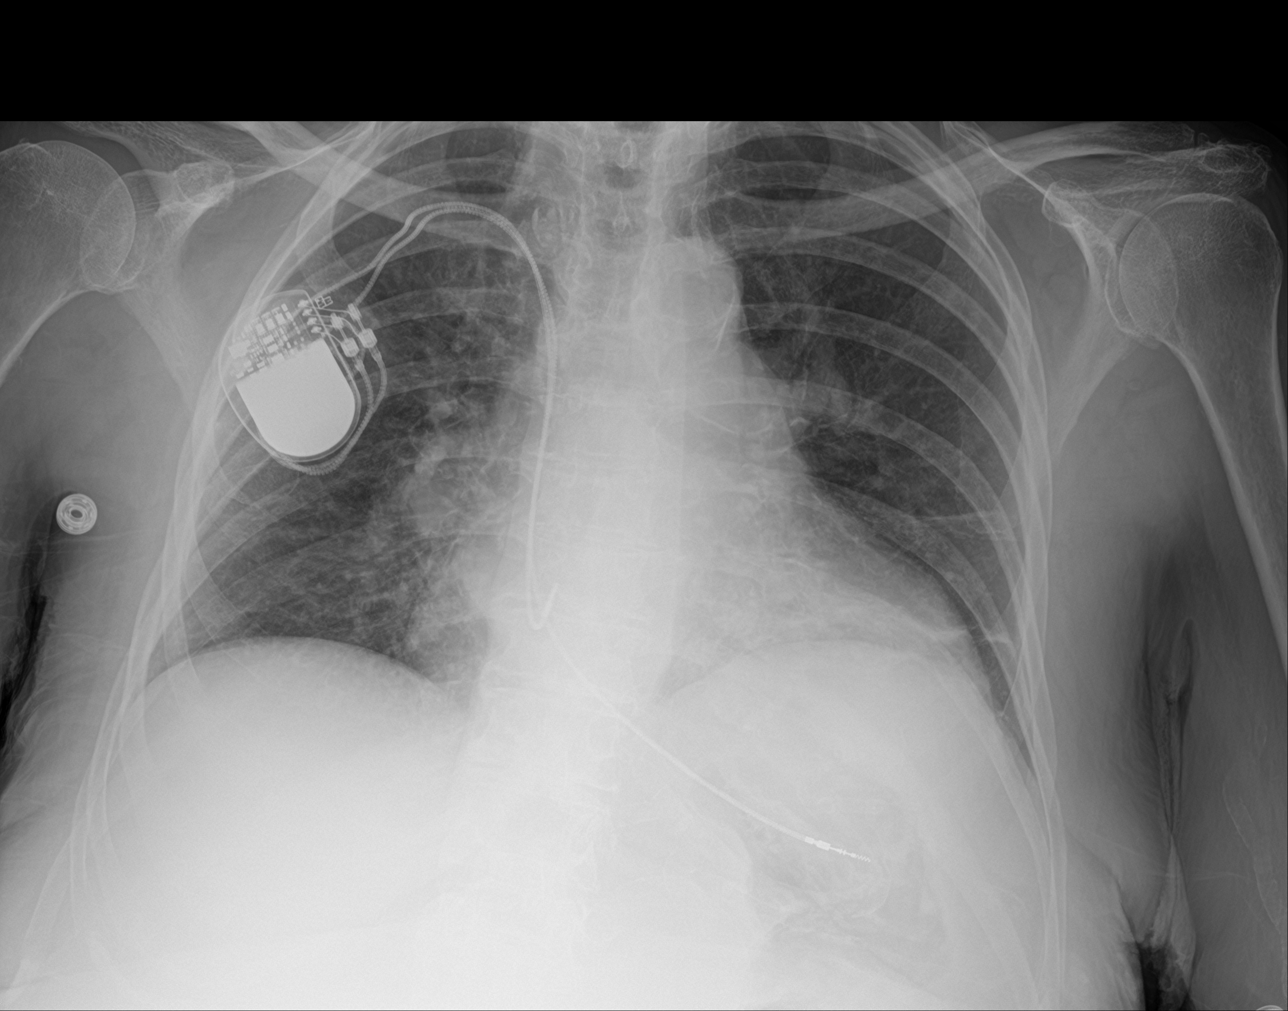

[chest lat (2 of 2)]
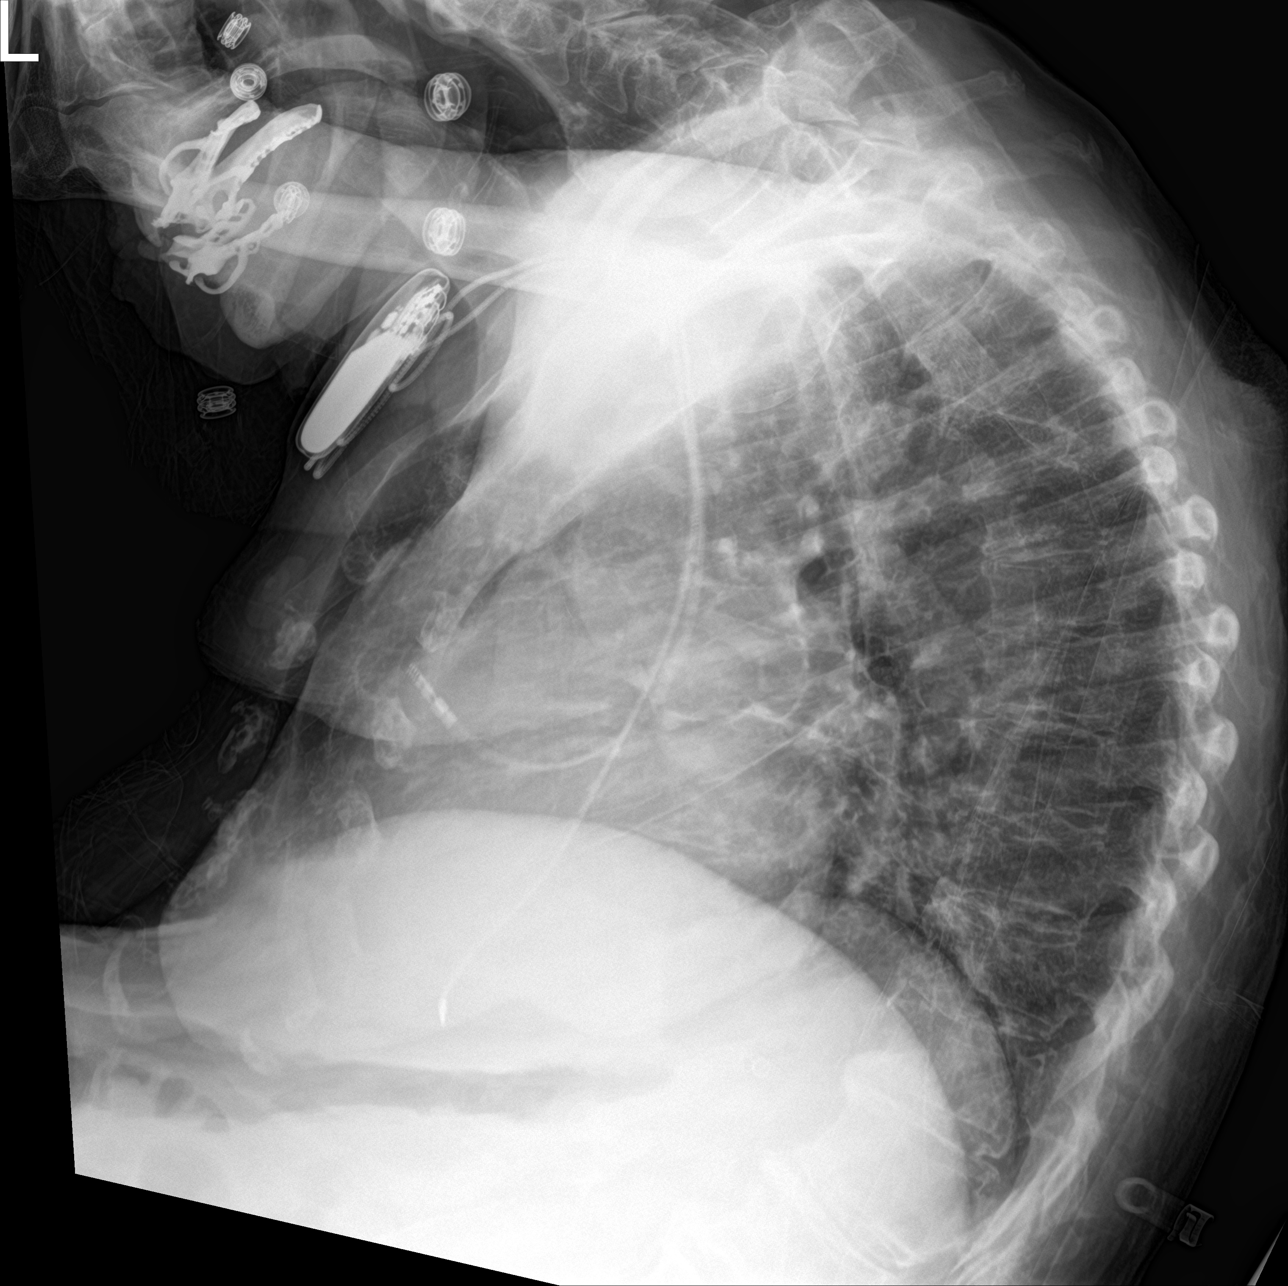

[3 of 3 positions shown; findings below may reference images not displayed]

FINDINGS: Stable cardiomegaly with aortic atherosclerosis. No aneurysm.
Atelectasis and/or scarring at the left lung base and region of the
lingula. No overt pulmonary edema. No effusion or pneumothorax.
Right-sided pacemaker apparatus is noted with right atrial and right
ventricular leads in place. No acute nor suspicious osseous
abnormality.
IMPRESSION: Stable cardiomegaly with aortic atherosclerosis. Left basilar
atelectasis and/or scarring. No overt pulmonary edema or pneumonic
consolidation.

## 2017-12-17 LAB — PROTIME-INR: Protime: 49 — AB (ref 10.0–13.8)

## 2017-12-17 LAB — POCT INR: INR: 4.9 — AB (ref 0.9–1.1)

## 2017-12-19 ENCOUNTER — Encounter: Payer: Self-pay | Admitting: Adult Health

## 2017-12-19 ENCOUNTER — Non-Acute Institutional Stay (SKILLED_NURSING_FACILITY): Payer: Medicare Other | Admitting: Adult Health

## 2017-12-19 DIAGNOSIS — J189 Pneumonia, unspecified organism: Secondary | ICD-10-CM | POA: Diagnosis not present

## 2017-12-19 DIAGNOSIS — Z7901 Long term (current) use of anticoagulants: Secondary | ICD-10-CM

## 2017-12-19 DIAGNOSIS — I480 Paroxysmal atrial fibrillation: Secondary | ICD-10-CM | POA: Diagnosis not present

## 2017-12-19 LAB — POCT INR: INR: 4.5 — AB (ref 0.9–1.1)

## 2017-12-19 LAB — PROTIME-INR: Protime: 41.4 — AB (ref 10.0–13.8)

## 2017-12-19 NOTE — Progress Notes (Signed)
Location:  [facility] Room Number: 212-A Place of Service:  SNF (31) Provider:  Kenard Gower, NP  Patient Care Team: [name], MD as PCP - General (Internal Medicine)  Extended Emergency Contact Information Primary Emergency Contact: Lemont Fillers of [place] Home Phone: [phone] Work Phone: [phone] Mobile Phone: [phone] Relation: Daughter Secondary Emergency Contact: Stegmaier,Russell Home Phone: [phone] Relation: Son  Code Status:  DNR  Goals of care: Advanced Directive information Advanced Directives 12/19/2017  Does Patient Have a Medical Advance Directive? Yes  Type of Advance Directive Out of facility DNR (pink MOST or yellow form)  Does patient want to make changes to medical advance directive? No - Patient declined  Copy of Healthcare Power of Attorney in Chart? -     Chief Complaint  Patient presents with  . Acute Visit    Patient has pneumonia and an elevated INR of 4.5.    HPI:  Pt is an 83 y.o. female seen today for an acute visit due to pneumonia demonstrated on chest x-ray and an INR of 4.5.  She is a short-term rehabilitation resident of [facility].  She has a PMH of A. Fib/flutter status post permanent pacemaker placement, diastolic congestive heart failure, dyslipidemia, HTN, insulin-dependent diabetes and Parkinson's disease. She was seen in the room today and was noted to agitated and wants to go home. She has ordered for me to take her stuffs out of the closet. Reminded her that her going home needs to be a safe discharge and informed her of the chest x-ray result. No fever has been reported. Chest x-ray result showed bilateral infiltrates.She has productive cough. Latest INR 4.5, supratherapeutic. No active bleeding has been noted.  She has been admitted to [facility] on 12/04/17 from being hospitalized 3/21 - 12/04/17 for progressive  cellulitis of the lower extremities which was unresponsive to outpatient oral antibiotics. Rocephin was initiated for and was found to have Proteus UTI.    Past Medical History:  Diagnosis Date  . AF (atrial fibrillation) (HCC)   . Ankle fracture, right 1972  . Cellulitis of foot, right   . Diabetic retinopathy   . HTN (hypertension)   . Hyperlipidemia   . Low back pain   . Morbid obesity (HCC)   . Osteoporosis   . Parkinson disease (HCC)   . Type 2 diabetes mellitus (HCC) 1972  . Vitamin B12 deficiency    Past Surgical History:  Procedure Laterality Date  . CHOLECYSTECTOMY  1985  . PACEMAKER INSERTION  around 2010 per pt daughter   [employer]    Allergies  Allergen Reactions  . Penicillins Hives and Itching    Has patient had a PCN reaction causing immediate rash, facial/tongue/throat swelling, SOB or lightheadedness with hypotension: Yes Has patient had a PCN reaction causing severe rash involving mucus membranes or skin necrosis: No Has patient had a PCN reaction that required hospitalization: No Has patient had a PCN reaction occurring within the last 10 years: No If all of the above answers are "NO", then may proceed with Cephalosporin use.  . Diphenoxylate-Atropine Other (See Comments)    Unknown reaction  . Latex Itching and Rash    Outpatient Encounter Medications as of 12/19/2017  Medication Sig  . albuterol (PROVENTIL) (2.5 MG/3ML) 0.083% nebulizer solution Take 2.5 mg by nebulization every 6 (six) hours as needed for wheezing or shortness of breath.  Marland Kitchen atenolol (TENORMIN) 25 MG tablet Take 25-50 mg by mouth See  admin instructions. Take 50 mg by mouth in the morning and 25 mg bedtime  . atorvastatin (LIPITOR) 20 MG tablet Take 20 mg by mouth daily at 6 PM.   . carbidopa-levodopa (SINEMET IR) 25-100 MG tablet Take 2 tablets by mouth 3 (three) times daily.  . Cholecalciferol (VITAMIN D-3) 1000 units CAPS Take 2,000 Units by mouth daily.  . cyanocobalamin 2000 MCG  tablet Take 2,000 mcg by mouth daily.  . digoxin (LANOXIN) 0.125 MG tablet Take one tablet by mouth daily except for Wednesday and Sunday.  . fish oil-omega-3 fatty acids 1000 MG capsule Take 2 g by mouth at bedtime.   Marland Kitchen guaiFENesin (ROBITUSSIN) 100 MG/5ML liquid Take 200 mg by mouth 3 (three) times daily.  Marland Kitchen HYDROcodone-acetaminophen (NORCO/VICODIN) 5-325 MG tablet Take 1 tablet by mouth 2 (two) times daily as needed for moderate pain.  Marland Kitchen insulin glargine (LANTUS) 100 UNIT/ML injection Inject 0.06 mLs (6 Units total) into the skin 2 (two) times daily.  Marland Kitchen L-Methylfolate-B6-B12 (FOLTANX PO) Take 1 tablet by mouth every evening.   . Multiple Vitamins-Minerals (CENTRUM SILVER 50+WOMEN) TABS Take 1 tablet by mouth daily.  . [DISCONTINUED] warfarin (COUMADIN) 5 MG tablet Take 5 mg by mouth at bedtime.    No facility-administered encounter medications on file as of 12/19/2017.     Review of Systems  GENERAL: No change in appetite, no fatigue, no weight changes, no fever, chills or weakness MOUTH and THROAT: Denies oral discomfort, gingival pain  RESPIRATORY: no SOB, DOE, wheezing, hemoptysis, + cough CARDIAC: No chest pain, edema or palpitations GI: No abdominal pain, diarrhea, constipation, heart burn, nausea or vomiting PSYCHIATRIC: Denies feelings of depression or anxiety. No report of hallucinations, insomnia, paranoia, or agitation   Immunization History  Administered Date(s) Administered  . Influenza-Unspecified 04/12/2017  . Pneumococcal-Unspecified 09/12/2014   Pertinent  Health Maintenance Due  Topic Date Due  . FOOT EXAM  10/15/1944  . OPHTHALMOLOGY EXAM  10/15/1944  . URINE MICROALBUMIN  10/15/1944  . DEXA SCAN  10/15/2001  . PNA vac Low Risk Adult (2 of 2 - PCV13) 09/13/2015  . HEMOGLOBIN A1C  06/12/2017  . INFLUENZA VACCINE  04/12/2018   Fall Risk  06/27/2017  Falls in the past year? No      Vitals:   12/19/17 1308  BP: 124/86  Pulse: 73  Resp: 18  Temp: (!) 97.4  F (36.3 C)  TempSrc: Oral  SpO2: 94%  Weight: 183 lb 3.2 oz (83.1 kg)  Height: 5\' 4"  (1.626 m)   Body mass index is 31.45 kg/m.  Physical Exam  GENERAL APPEARANCE: Well nourished. In no acute distress. Obese SKIN:  Left lower leg with wound covered with dressing, right forearm skin tear has dressing MOUTH and THROAT: Lips are without lesions. Oral mucosa is moist and without lesions. Tongue is normal in shape, size, and color and without lesions RESPIRATORY: Breathing is even & unlabored, rales on bilateral lung fields CARDIAC: RRR, no murmur,no extra heart sounds, BLE trace edema, right chest pacemaker GI: Abdomen soft, normal BS, no masses, no tenderness EXTREMITIES:  Able to move X 4 extremities PSYCHIATRIC: Alert to self, disoriented to time and place. Affect and behavior are appropriate  Labs reviewed: Recent Labs    12/01/17 0458 12/02/17 0721 12/04/17 0733  NA 143 141 140  K 4.3 4.4 4.1  CL 115* 112* 111  CO2 19* 17* 19*  GLUCOSE 44* 118* 117*  BUN 48* 37* 29*  CREATININE 2.00* 1.63* 1.49*  CALCIUM 8.3*  8.3* 8.5*   Recent Labs    11/30/17 1800  AST 26  ALT 5*  ALKPHOS 87  BILITOT 0.4  PROT 6.9  ALBUMIN 2.8*   Recent Labs    11/30/17 1800  12/02/17 0721 12/03/17 0631 12/04/17 0733  WBC 16.5*   < > 14.5* 14.2* 13.5*  NEUTROABS 12.1*  --   --   --   --   HGB 11.6*   < > 10.9* 10.6* 10.7*  HCT 37.4   < > 35.1* 33.3* 34.1*  MCV 88.8   < > 87.8 87.6 87.4  PLT 371   < > 265 279 264   < > = values in this interval not displayed.   Lab Results  Component Value Date   TSH 2.184 Test methodology is 3rd generation TSH 07/31/2009    Lab Results  Component Value Date   HGBA1C 7.6 (H) 12/11/2016     Assessment/Plan  1. HCAP (healthcare-associated pneumonia) - chest x-ray showed bilateral infiltrates, will start Doxycycline 100 mg PO BID X 10 days and Florastor 250 mg 1 capsule PO BID X 13 days, albuterol 2.5 mg/.783ml 1 neb Q 6AM, 2PM, and 10 PM X 5  days, and Robitussin 100 mg/635ml give 10 ml Q 6AM, 2PM and 10PM X 2 weeks   2. Long term current use of anticoagulant - INR 4.5, supratherapeutic, no bleeding noted, will hold Coumadin X2 days and INR on 12/21/17 and check CBC with differentials  3. Paroxysmal atrial fibrillation (HCC) - rate-controlled, continue Coumadin for anticoagulation, and Digoxin      Kenard GowerMonina Medina-Vargas, NP [facility] [phone] (Monday-Friday 8:00 a.m. - 5:00 p.m.) [phone] (after hours)

## 2017-12-20 LAB — BASIC METABOLIC PANEL
BUN: 24 — AB (ref 4–21)
CREATININE: 1 (ref 0.5–1.1)
Glucose: 210
Potassium: 3.8 (ref 3.4–5.3)
Sodium: 141 (ref 137–147)

## 2017-12-20 LAB — HEPATIC FUNCTION PANEL
ALT: 9 (ref 7–35)
AST: 24 (ref 13–35)
Alkaline Phosphatase: 103 (ref 25–125)
Bilirubin, Total: 0.5

## 2017-12-21 LAB — PROTIME-INR: PROTIME: 18.3 — AB (ref 10.0–13.8)

## 2017-12-21 LAB — POCT INR: INR: 1.6 — AB (ref 0.9–1.1)

## 2017-12-25 LAB — POCT INR: INR: 1.9 — AB (ref 0.9–1.1)

## 2017-12-25 LAB — PROTIME-INR: Protime: 21.3 — AB (ref 10.0–13.8)

## 2018-01-01 ENCOUNTER — Non-Acute Institutional Stay (SKILLED_NURSING_FACILITY): Payer: Medicare Other | Admitting: Adult Health

## 2018-01-01 ENCOUNTER — Encounter: Payer: Self-pay | Admitting: Adult Health

## 2018-01-01 DIAGNOSIS — R531 Weakness: Secondary | ICD-10-CM | POA: Diagnosis not present

## 2018-01-01 DIAGNOSIS — L03119 Cellulitis of unspecified part of limb: Secondary | ICD-10-CM

## 2018-01-01 DIAGNOSIS — E0821 Diabetes mellitus due to underlying condition with diabetic nephropathy: Secondary | ICD-10-CM

## 2018-01-01 DIAGNOSIS — I10 Essential (primary) hypertension: Secondary | ICD-10-CM | POA: Diagnosis not present

## 2018-01-01 DIAGNOSIS — I5032 Chronic diastolic (congestive) heart failure: Secondary | ICD-10-CM

## 2018-01-01 DIAGNOSIS — I480 Paroxysmal atrial fibrillation: Secondary | ICD-10-CM | POA: Diagnosis not present

## 2018-01-01 DIAGNOSIS — G20 Parkinson's disease: Secondary | ICD-10-CM

## 2018-01-01 DIAGNOSIS — E782 Mixed hyperlipidemia: Secondary | ICD-10-CM

## 2018-01-01 DIAGNOSIS — G20A1 Parkinson's disease without dyskinesia, without mention of fluctuations: Secondary | ICD-10-CM

## 2018-01-01 DIAGNOSIS — Z794 Long term (current) use of insulin: Secondary | ICD-10-CM

## 2018-01-01 NOTE — Progress Notes (Signed)
Location:  [facility] Room Number: 212-A Place of Service:  SNF (31) Provider:  Kenard Gower, NP  Patient Care Team: [name], MD as PCP - General (Internal Medicine)  Extended Emergency Contact Information Primary Emergency Contact: Lemont Fillers of [place] Home Phone: [phone] Work Phone: [phone] Mobile Phone: [phone] Relation: Daughter Secondary Emergency Contact: Frommelt,Russell Home Phone: [phone] Relation: Son  Code Status:  DNR  Goals of care: Advanced Directive information Advanced Directives 01/01/2018  Does Patient Have a Medical Advance Directive? Yes  Type of Advance Directive Out of facility DNR (pink MOST or yellow form)  Does patient want to make changes to medical advance directive? No - Patient declined  Copy of Healthcare Power of Attorney in Chart? -     Chief Complaint  Patient presents with  . Readmit To SNF    Routine [facility] visit    HPI:  Pt is an 83 y.o. female seen today for a routine visit. She has a PMH of atrial fibrillation/flutter status post permanent pacemaker placement, diastolic CHF, dyslipidemia, HTN, IDDM, and Parkinson's disease. She has been admitted to [facility] on 12/04/17 from the hospital admission dates 11/30/17-12/04/17 for progressive cellulitis of the lower extremities which was unresponsive to outpatient oral antibiotics. She given Rocephin and was found to have Proteus UTI.    She was seen in the room today. Lower extremities noted to be dry and no erythema - cellulitis resolved. Right forearm skin tear has scab X 2. Dry and no erythema. Patient said that she" [place] She is confused to time and place. She said that her good friend "Drinda Butts, who is a good friend cooks for her." She was recently treated with Doxycycline X 10 days for HCAP. No coughing noted today. Talked to daughter and  updated her with her condition. Daughter thinks she is more confused. BIMS score, done 2 weeks ago, was 10/15. Daughter said that they are appealing to insurance if she can have more therapy days since she is still weak and needing more assistance.     Past Medical History:  Diagnosis Date  . AF (atrial fibrillation) (HCC)   . Ankle fracture, right 1972  . Cellulitis of foot, right   . Diabetic retinopathy   . HTN (hypertension)   . Hyperlipidemia   . Low back pain   . Morbid obesity (HCC)   . Osteoporosis   . Parkinson disease (HCC)   . Type 2 diabetes mellitus (HCC) 1972  . Vitamin B12 deficiency    Past Surgical History:  Procedure Laterality Date  . CHOLECYSTECTOMY  1985  . PACEMAKER INSERTION  around 2010 per pt daughter   [employer]    Allergies  Allergen Reactions  . Penicillins Hives and Itching    Has patient had a PCN reaction causing immediate rash, facial/tongue/throat swelling, SOB or lightheadedness with hypotension: Yes Has patient had a PCN reaction causing severe rash involving mucus membranes or skin necrosis: No Has patient had a PCN reaction that required hospitalization: No Has patient had a PCN reaction occurring within the last 10 years: No If all of the above answers are "NO", then may proceed with Cephalosporin use.  . Diphenoxylate-Atropine Other (See Comments)    Unknown reaction  . Latex Itching and Rash    Outpatient Encounter Medications as of 01/01/2018  Medication Sig  . atenolol (TENORMIN) 25 MG tablet Take 25 mg by mouth at bedtime.  Marland Kitchen  atenolol (TENORMIN) 50 MG tablet Take 50 mg by mouth daily.  Marland Kitchen atorvastatin (LIPITOR) 20 MG tablet Take 20 mg by mouth daily at 6 PM.   . carbidopa-levodopa (SINEMET IR) 25-100 MG tablet Take 2 tablets by mouth 3 (three) times daily.  . Cholecalciferol (VITAMIN D-3) 1000 units CAPS Take 2,000 Units by mouth daily.  . cyanocobalamin 2000 MCG tablet Take 2,000 mcg by mouth daily.  . digoxin (LANOXIN) 0.125  MG tablet Take one tablet by mouth daily except for Wednesday and Sunday.  . fish oil-omega-3 fatty acids 1000 MG capsule Take 2 g by mouth at bedtime.   Marland Kitchen guaiFENesin (ROBITUSSIN) 100 MG/5ML liquid Take 200 mg by mouth 3 (three) times daily.  Marland Kitchen HYDROcodone-acetaminophen (NORCO/VICODIN) 5-325 MG tablet Take 1 tablet by mouth 2 (two) times daily as needed for moderate pain.  Marland Kitchen insulin glargine (LANTUS) 100 UNIT/ML injection Inject 0.06 mLs (6 Units total) into the skin 2 (two) times daily.  Marland Kitchen L-Methylfolate-B6-B12 (FOLTANX PO) Take 1 tablet by mouth every evening.   . Multiple Vitamins-Minerals (CENTRUM SILVER 50+WOMEN) TABS Take 1 tablet by mouth daily.  Marland Kitchen saccharomyces boulardii (FLORASTOR) 250 MG capsule Take 250 mg by mouth 2 (two) times daily.  Marland Kitchen warfarin (COUMADIN) 3 MG tablet Take 3 mg by mouth daily.  . [DISCONTINUED] atenolol (TENORMIN) 25 MG tablet Take 25-50 mg by mouth See admin instructions. Take 50 mg by mouth in the morning and 25 mg bedtime   No facility-administered encounter medications on file as of 01/01/2018.     Review of Systems  GENERAL: No change in appetite, no fatigue, no weight changes, no fever, chills or weakness MOUTH and THROAT: Denies oral discomfort, gingival pain or bleeding, pain from teeth or hoarseness   RESPIRATORY: no cough, SOB, DOE, wheezing, hemoptysis CARDIAC: No chest pain, edema or palpitations GI: No abdominal pain, diarrhea, constipation, heart burn, nausea or vomiting PSYCHIATRIC: Denies feelings of depression or anxiety. No report of hallucinations, insomnia, paranoia, or agitation    Immunization History  Administered Date(s) Administered  . Influenza-Unspecified 04/12/2017  . Pneumococcal-Unspecified 09/12/2014   Pertinent  Health Maintenance Due  Topic Date Due  . FOOT EXAM  10/15/1944  . OPHTHALMOLOGY EXAM  10/15/1944  . URINE MICROALBUMIN  10/15/1944  . DEXA SCAN  10/15/2001  . PNA vac Low Risk Adult (2 of 2 - PCV13) 09/13/2015    . HEMOGLOBIN A1C  06/12/2017  . INFLUENZA VACCINE  04/12/2018   Fall Risk  06/27/2017  Falls in the past year? No      Vitals:   01/01/18 1019  BP: 138/76  Pulse: 74  Resp: 20  Temp: (!) 97.3 F (36.3 C)  TempSrc: Oral  SpO2: 93%  Weight: 181 lb 12.8 oz (82.5 kg)  Height: 5\' 4"  (1.626 m)   Body mass index is 31.21 kg/m.  Physical Exam  GENERAL APPEARANCE: Well nourished. In no acute distress. Obese SKIN:  Right forearm scab X 2, no erythema  MOUTH and THROAT: Lips are without lesions. Oral mucosa is moist and without lesions.  RESPIRATORY: Breathing is even & unlabored, BS CTAB CARDIAC: RRR, no murmur,no extra heart sounds, no edema, Right chest pacemaker GI: Abdomen soft, normal BS, no masses, no tenderness, no hepatomegaly, no splenomegaly PSYCHIATRIC: Alert to self, disoriented to time and place. Affect and behavior are appropriate  Labs reviewed: Recent Labs    12/01/17 0458 12/02/17 0721 12/04/17 0733 12/20/17  NA 143 141 140 141  K 4.3 4.4 4.1 3.8  CL 115* 112* 111  --   CO2 19* 17* 19*  --   GLUCOSE 44* 118* 117*  --   BUN 48* 37* 29* 24*  CREATININE 2.00* 1.63* 1.49* 1.0  CALCIUM 8.3* 8.3* 8.5*  --    Recent Labs    11/30/17 1800 12/20/17  AST 26 24  ALT 5* 9  ALKPHOS 87 103  BILITOT 0.4  --   PROT 6.9  --   ALBUMIN 2.8*  --    Recent Labs    11/30/17 1800  12/02/17 0721 12/03/17 0631 12/04/17 0733  WBC 16.5*   < > 14.5* 14.2* 13.5*  NEUTROABS 12.1*  --   --   --   --   HGB 11.6*   < > 10.9* 10.6* 10.7*  HCT 37.4   < > 35.1* 33.3* 34.1*  MCV 88.8   < > 87.8 87.6 87.4  PLT 371   < > 265 279 264   < > = values in this interval not displayed.   Lab Results  Component Value Date   TSH 2.184 Test methodology is 3rd generation TSH 07/31/2009    Lab Results  Component Value Date   HGBA1C 7.6 (H) 12/11/2016    Assessment/Plan  1. Generalized weakness - for Home health PT and OT, for therapeutic strengthening exercises, fall  precautions   2. Cellulitis of lower extremity, unspecified laterality - completed antibiotic treatments, resolved   3. HYPERTENSION, BENIGN - well-controlled, continue Atenolol 50 mg daily and 25 mg Q HS   4. Chronic diastolic heart failure (HCC) - no SOB, continue Digoxin 0.125 mg 1 tab daily except Wednesday and Sunday, Atenolol 50 mg Q AM and 25 mg Q HS   5. Paroxysmal atrial fibrillation (HCC) - continue Atenolol 50 mg Q AM and 25 mg Q HS,  Digoxin 0.125 mg 1 tab Q D except Wednesday and Sunday   6. Parkinson disease (HCC) - continue Carbidopa-Levodopa 25-100  2 tabs TID   7. HYPERLIPIDEMIA-MIXED - continue Atorvastatin 20 mg 1 tab Q evening   8. Diabetes mellitus due to underlying condition with diabetic nephropathy, with long-term current use of insulin (HCC) - well-controlled, continue Lantus 100 units/ml inject 6 units SQ Q HS Lab Results  Component Value Date   HGBA1C 7.6 (H) 12/11/2016     Discussed plan of care with resident and daughter.   Goal of Care:  Short-term care  .  Kenard GowerMonina Medina-Vargas, NP [facility] [phone] (Monday-Friday 8:00 a.m. - 5:00 p.m.) [phone] (after hours)

## 2018-01-03 ENCOUNTER — Telehealth: Payer: Self-pay | Admitting: Cardiology

## 2018-01-03 ENCOUNTER — Ambulatory Visit (INDEPENDENT_AMBULATORY_CARE_PROVIDER_SITE_OTHER): Payer: Medicare Other | Admitting: *Deleted

## 2018-01-03 DIAGNOSIS — I442 Atrioventricular block, complete: Secondary | ICD-10-CM | POA: Diagnosis not present

## 2018-01-03 NOTE — Progress Notes (Signed)
Remote pacemaker transmission.   

## 2018-01-03 NOTE — Telephone Encounter (Signed)
Confirmed remote transmission w/ pt granddaughter.   

## 2018-01-05 ENCOUNTER — Encounter: Payer: Self-pay | Admitting: Cardiology

## 2018-01-05 LAB — CUP PACEART REMOTE DEVICE CHECK
Battery Remaining Longevity: 48 mo
Battery Voltage: 2.77 V
Implantable Lead Implant Date: 20101119
Implantable Lead Implant Date: 20101119
Implantable Lead Location: 753860
Implantable Pulse Generator Implant Date: 20101119
Lead Channel Impedance Value: 513 Ohm
Lead Channel Pacing Threshold Amplitude: 0.75 V
Lead Channel Pacing Threshold Pulse Width: 0.4 ms
Lead Channel Setting Pacing Amplitude: 2 V
Lead Channel Setting Pacing Pulse Width: 0.4 ms
Lead Channel Setting Sensing Sensitivity: 2 mV
MDC IDC LEAD LOCATION: 753859
MDC IDC MSMT BATTERY IMPEDANCE: 1267 Ohm
MDC IDC MSMT LEADCHNL RA IMPEDANCE VALUE: 446 Ohm
MDC IDC MSMT LEADCHNL RA PACING THRESHOLD AMPLITUDE: 0.875 V
MDC IDC MSMT LEADCHNL RA PACING THRESHOLD PULSEWIDTH: 0.4 ms
MDC IDC SESS DTM: 20190424184452
MDC IDC SET LEADCHNL RV PACING AMPLITUDE: 2.5 V
MDC IDC STAT BRADY AP VP PERCENT: 14 %
MDC IDC STAT BRADY AP VS PERCENT: 0 %
MDC IDC STAT BRADY AS VP PERCENT: 58 %
MDC IDC STAT BRADY AS VS PERCENT: 28 %

## 2018-01-08 ENCOUNTER — Encounter: Payer: Self-pay | Admitting: Internal Medicine

## 2018-01-08 DIAGNOSIS — R4189 Other symptoms and signs involving cognitive functions and awareness: Secondary | ICD-10-CM | POA: Insufficient documentation

## 2018-01-08 LAB — POCT INR: INR: 2.8 — AB (ref 0.9–1.1)

## 2018-01-08 LAB — PROTIME-INR: PROTIME: 28.4 — AB (ref 10.0–13.8)

## 2018-01-12 LAB — POCT INR: INR: 2 — AB (ref 0.9–1.1)

## 2018-01-12 LAB — PROTIME-INR: Protime: 22.4 — AB (ref 10.0–13.8)

## 2018-01-12 LAB — HEMOGLOBIN A1C: HEMOGLOBIN A1C: 9

## 2018-01-23 ENCOUNTER — Non-Acute Institutional Stay (SKILLED_NURSING_FACILITY): Payer: Medicare Other | Admitting: Adult Health

## 2018-01-23 ENCOUNTER — Encounter: Payer: Self-pay | Admitting: Adult Health

## 2018-01-23 DIAGNOSIS — E0821 Diabetes mellitus due to underlying condition with diabetic nephropathy: Secondary | ICD-10-CM | POA: Diagnosis not present

## 2018-01-23 DIAGNOSIS — Z794 Long term (current) use of insulin: Secondary | ICD-10-CM

## 2018-01-23 DIAGNOSIS — I5032 Chronic diastolic (congestive) heart failure: Secondary | ICD-10-CM

## 2018-01-23 DIAGNOSIS — G20 Parkinson's disease: Secondary | ICD-10-CM

## 2018-01-23 DIAGNOSIS — E782 Mixed hyperlipidemia: Secondary | ICD-10-CM

## 2018-01-23 DIAGNOSIS — I10 Essential (primary) hypertension: Secondary | ICD-10-CM

## 2018-01-23 DIAGNOSIS — I480 Paroxysmal atrial fibrillation: Secondary | ICD-10-CM

## 2018-01-23 NOTE — Progress Notes (Signed)
Location:  [facility] Room Number: 212-A Place of Service:  SNF (31) Provider:  Kenard Gower, NP  Patient Care Team: [name], MD as PCP - General (Internal Medicine)  Extended Emergency Contact Information Primary Emergency Contact: Lemont Fillers of [place] Home Phone: [phone] Work Phone: [phone] Mobile Phone: [phone] Relation: Daughter Secondary Emergency Contact: Pangelinan,Russell Home Phone: [phone] Relation: Son  Code Status:  DNR  Goals of care: Advanced Directive information Advanced Directives 01/23/2018  Does Patient Have a Medical Advance Directive? Yes  Type of Advance Directive Out of facility DNR (pink MOST or yellow form)  Does patient want to make changes to medical advance directive? No - Patient declined  Copy of Healthcare Power of Attorney in Chart? -     Chief Complaint  Patient presents with  . Medical Management of Chronic Issues    Routine [facility] visit    HPI:  Pt is an 83 y.o. female seen today for medical management of chronic diseases.  She is a long-term care resident of [facility].  She has a PMH of atrial fibrillation/flutter status post permanent pacemaker placement, diastolic CHF, dyslipidemia, hypertension, Type 2 diabetes mellitus, and Parkinson's disease. She was seen in the room today. CBGs has been noted to be stable - 201, 111, 245, 246. BPs were stable upon review - 106/95, 118/67, 143/63, 118/72. HR range is 60-89. Marland Kitchen      Past Medical History:  Diagnosis Date  . AF (atrial fibrillation) (HCC)   . Ankle fracture, right 1972  . Cellulitis of foot, right   . Diabetic retinopathy   . HTN (hypertension)   . Hyperlipidemia   . Low back pain   . Morbid obesity (HCC)   . Osteoporosis   . Parkinson disease (HCC)   . Type 2 diabetes mellitus (HCC) 1972  . Vitamin B12 deficiency    Past Surgical History:  Procedure Laterality Date    . CHOLECYSTECTOMY  1985  . PACEMAKER INSERTION  around 2010 per pt daughter   [employer]    Allergies  Allergen Reactions  . Penicillins Hives and Itching    Has patient had a PCN reaction causing immediate rash, facial/tongue/throat swelling, SOB or lightheadedness with hypotension: Yes Has patient had a PCN reaction causing severe rash involving mucus membranes or skin necrosis: No Has patient had a PCN reaction that required hospitalization: No Has patient had a PCN reaction occurring within the last 10 years: No If all of the above answers are "NO", then may proceed with Cephalosporin use.  . Diphenoxylate-Atropine Other (See Comments)    Unknown reaction  . Latex Itching and Rash    Outpatient Encounter Medications as of 01/23/2018  Medication Sig  . atenolol (TENORMIN) 25 MG tablet Take 25 mg by mouth at bedtime.  Marland Kitchen atenolol (TENORMIN) 50 MG tablet Take 50 mg by mouth every morning.   Marland Kitchen atorvastatin (LIPITOR) 20 MG tablet Take 20 mg by mouth daily at 6 PM.   . bacitracin 500 UNIT/GM ointment Apply 1 application topically daily. Apply to left upper arm until healed  . carbidopa-levodopa (SINEMET IR) 25-100 MG tablet Take 2 tablets by mouth 3 (three) times daily.  . Cholecalciferol (VITAMIN D-3) 1000 units CAPS Take 2,000 Units by mouth daily.  . cyanocobalamin 2000 MCG tablet Take 2,000 mcg by mouth daily.  . digoxin (LANOXIN) 0.125 MG tablet Take one tablet by mouth daily except for Wednesday and Sunday.  . fish oil-omega-3 fatty acids  1000 MG capsule Take 2 g by mouth at bedtime.   Marland Kitchen HYDROcodone-acetaminophen (NORCO/VICODIN) 5-325 MG tablet Take 1 tablet by mouth 2 (two) times daily as needed for moderate pain.  . Insulin Glargine (LANTUS SOLOSTAR [place]) Inject 10 Units into the skin at bedtime.  Marland Kitchen L-Methylfolate-B6-B12 (FOLTANX PO) Take 1 tablet by mouth every evening.   . Multiple Vitamins-Minerals (CENTRUM SILVER 50+WOMEN) TABS Take 1 tablet by mouth daily.  Marland Kitchen POWDERS EX Apply  1 application topically 2 (two) times daily. Microgard powder to perineum and abdominal folds  . warfarin (COUMADIN) 3 MG tablet Take 3 mg by mouth daily.  . [DISCONTINUED] insulin glargine (LANTUS) 100 UNIT/ML injection Inject 0.06 mLs (6 Units total) into the skin 2 (two) times daily.   No facility-administered encounter medications on file as of 01/23/2018.     Review of Systems  GENERAL: No change in appetite, no fatigue, no weight changes, no fever, chills or weakness MOUTH and THROAT: Denies oral discomfort, gingival pain or bleeding, pain from teeth or hoarseness   RESPIRATORY: no cough, SOB, DOE, wheezing, hemoptysis CARDIAC: No chest pain, edema or palpitations GI: No abdominal pain, diarrhea, constipation, heart burn, nausea or vomiting PSYCHIATRIC: Denies feelings of depression or anxiety. No report of hallucinations, insomnia, paranoia, or agitation   Immunization History  Administered Date(s) Administered  . Influenza-Unspecified 04/12/2017  . Pneumococcal-Unspecified 09/12/2014   Pertinent  Health Maintenance Due  Topic Date Due  . FOOT EXAM  10/15/1944  . OPHTHALMOLOGY EXAM  10/15/1944  . URINE MICROALBUMIN  10/15/1944  . PNA vac Low Risk Adult (2 of 2 - PCV13) 02/23/2018 (Originally 09/13/2015)  . INFLUENZA VACCINE  04/12/2018  . HEMOGLOBIN A1C  07/15/2018  . DEXA SCAN  Discontinued   Fall Risk  06/27/2017  Falls in the past year? No   Body mass index is 30.83 kg/m.  Physical Exam  GENERAL APPEARANCE: Well nourished. In no acute distress. Obese SKIN:  Skin is warm and dry.  MOUTH and THROAT: Lips are without lesions. Oral mucosa is moist and without lesions. Tongue is normal in shape, size, and color and without lesions RESPIRATORY: Breathing is even & unlabored, BS CTAB CARDIAC: RRR, no extra heart sounds, no edema, has pacemaker on the right chest GI: Abdomen soft, normal BS, no masses, no tenderness EXTREMITIES:  Able to move X 4 extremities PSYCHIATRIC:  Alert and oriented X 3. Affect and behavior are appropriate   Labs reviewed: Recent Labs    12/01/17 0458 12/02/17 0721 12/04/17 0733 12/20/17  NA 143 141 140 141  K 4.3 4.4 4.1 3.8  CL 115* 112* 111  --   CO2 19* 17* 19*  --   GLUCOSE 44* 118* 117*  --   BUN 48* 37* 29* 24*  CREATININE 2.00* 1.63* 1.49* 1.0  CALCIUM 8.3* 8.3* 8.5*  --    Recent Labs    11/30/17 1800 12/20/17  AST 26 24  ALT 5* 9  ALKPHOS 87 103  BILITOT 0.4  --   PROT 6.9  --   ALBUMIN 2.8*  --    Recent Labs    11/30/17 1800  12/02/17 0721 12/03/17 0631 12/04/17 0733  WBC 16.5*   < > 14.5* 14.2* 13.5*  NEUTROABS 12.1*  --   --   --   --   HGB 11.6*   < > 10.9* 10.6* 10.7*  HCT 37.4   < > 35.1* 33.3* 34.1*  MCV 88.8   < > 87.8 87.6 87.4  PLT 371   < > 265 279 264   < > = values in this interval not displayed.   Lab Results  Component Value Date   TSH 2.184 Test methodology is 3rd generation TSH 07/31/2009    Lab Results  Component Value Date   HGBA1C 9 01/12/2018    Assessment/Plan  1. Chronic diastolic heart failure (HCC) - no SOB, continue Atenolol 50 mg Q AM and 25 mg Q PM, Digoxin 0.125 mg daily except Wed and Sun,    2. HYPERLIPIDEMIA-MIXED - continue Atorvastatin 20 mg Q evening   3. HYPERTENSION, BENIGN - well-controlled, continue Atenolol 50 mg Q AM and 25 mg Q PM   4. Paroxysmal atrial fibrillation (HCC) - rate-controlled, continue  Atenolol 50 mg Q AM and 25 mg Q PM, Digoxin 0.125 mg daily except Wed and Sun, Coumadin 3 mg daily   5. Parkinson disease (HCC) - continue  Carbidopa-Levodopa 25-100 2 tabs TID   6. Diabetes mellitus due to underlying condition with diabetic nephropathy, with long-term current use of insulin (HCC) - continue Lantus 100 units/ml inject 10 units SQ Q HS Lab Results  Component Value Date   HGBA1C 9 01/12/2018      Family/ staff Communication: Discussed plan of care with resident.  Labs/tests ordered:  CBC  Goals of care:   Long-term  care   Kenard Gower, NP [facility] [phone] (Monday-Friday 8:00 a.m. - 5:00 p.m.) [phone] (after hours)

## 2018-01-26 LAB — MICROALBUMIN, URINE: MICROALB UR: 57.8

## 2018-02-01 ENCOUNTER — Encounter: Payer: Self-pay | Admitting: Internal Medicine

## 2018-02-01 ENCOUNTER — Non-Acute Institutional Stay (SKILLED_NURSING_FACILITY): Payer: Medicare Other | Admitting: Internal Medicine

## 2018-02-01 DIAGNOSIS — R4189 Other symptoms and signs involving cognitive functions and awareness: Secondary | ICD-10-CM

## 2018-02-01 DIAGNOSIS — I10 Essential (primary) hypertension: Secondary | ICD-10-CM

## 2018-02-01 DIAGNOSIS — E0821 Diabetes mellitus due to underlying condition with diabetic nephropathy: Secondary | ICD-10-CM | POA: Diagnosis not present

## 2018-02-01 DIAGNOSIS — Z794 Long term (current) use of insulin: Secondary | ICD-10-CM

## 2018-02-01 DIAGNOSIS — G20 Parkinson's disease: Secondary | ICD-10-CM

## 2018-02-01 NOTE — Assessment & Plan Note (Addendum)
See 02/01/18 Parkinson's associated with hallucinations of individuals being in the room, nightmares of being kidnapped, and intermittent acute anxiety attacks Neurology reassessment by Dr Marjory Lies ASAP

## 2018-02-01 NOTE — Progress Notes (Signed)
   NURSING HOME LOCATION:  [facility] ROOM NUMBER:  212-A  CODE STATUS:  DNR  PCP:  [name], MD  [address] [place] [address]  This is a nursing facility follow up for specific issue of competency evaluation  Interim medical record and care since last [facility] visit was updated with review of diagnostic studies and change in clinical status since last visit were documented.  HPI: The patient states that her daughter, Lorriane Shire pays her bills and handles her financial matters. I attempted to reach her daughter , but the phone number she gave for her daughter [phone] ) proved to be incorrect. Her son Perlie Gold is her healthcare power of attorney. The patient has Parkinsons and is followed by [facility], Dr. Marjory Lies. She describes some hallucinations, this is not been discussed with her Neurologist. Hallucinations are described as of 2 types. One is a sensation of seeing someone in the room. While I was interviewing her she called out to her daughter whom she thought was in the immediate area. This type of hallucination is not disturbing to her. She has other hallucinations mainly at night as nightmares. On at least 3 occasions she has awakened from a dream that someone is trying to kidnap her. She becomes frightened and will try to get out of bed and crawl to safety. Staff reports that the patient has marked anxiety at times and at times refuses care. She is especially anxious when standing for fear that she will fall.  There has been marked variability  in her glucoses fasting glucoses ranging from 111-432. Her most recent A1c was 9% on 01/12/18 indicating uncontrolled diabetes. She is on low-dose Lantus 10 units at bedtime.  Blood pressures are also markedly variable with values from 98/67-191/91. She is on a nonselective beta blocker atenolol.  Physical exam was limited to observation of her affect and Parkinsonian features. These include  masklike facies and paucity of movement. She had intermittent tremor of the left hand. MMSE was performed.She scored 29 out of 30. She did actually misspell "world" backwards twice but was aware of the displacement ("dlorw") of the letters. 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 Again this was due to loss of manual dexterity due to the Parkinson's as she even had difficulty holding the pen.   See summary under each active problem in the Problem List with associated updated therapeutic plan

## 2018-02-01 NOTE — Assessment & Plan Note (Signed)
02/01/18 increase basal insulin to 15 units at bedtime and assess ac eve glucoses & employ [employer]

## 2018-02-01 NOTE — Patient Instructions (Addendum)
See assessment and plan under each diagnosis in the problem list and acutely for this visit Total time 42  minutes; greater than 50% of the visit spent counseling patient and coordinating care for problems addressed at this encounter  

## 2018-02-01 NOTE — Assessment & Plan Note (Addendum)
02/01/18 Competency documented but hallucinations when present & dexterity issues associated with Parkinson's are complicating factors . Patient requests daughter Ardelia Mems  to help manage her financial affairs and son Perlie Gold to be [employer]

## 2018-02-01 NOTE — Assessment & Plan Note (Addendum)
02/01/18 marked variability in blood pressures;anxiety may be contributing factor Atenolol will be changed to a higher dose selective beta blocker and blood pressures monitored

## 2018-02-07 LAB — POCT INR: INR: 2.9 — AB (ref 0.9–1.1)

## 2018-02-07 NOTE — Progress Notes (Signed)
INR was done by [facility] staff.

## 2018-02-12 LAB — POCT INR: INR: 2.7 — AB (ref 0.9–1.1)

## 2018-02-19 LAB — POCT INR: INR: 2.2 — AB (ref 0.9–1.1)

## 2018-02-28 ENCOUNTER — Encounter: Payer: Self-pay | Admitting: Adult Health

## 2018-02-28 ENCOUNTER — Non-Acute Institutional Stay (SKILLED_NURSING_FACILITY): Payer: Medicare Other | Admitting: Adult Health

## 2018-02-28 DIAGNOSIS — E0821 Diabetes mellitus due to underlying condition with diabetic nephropathy: Secondary | ICD-10-CM | POA: Diagnosis not present

## 2018-02-28 DIAGNOSIS — I5032 Chronic diastolic (congestive) heart failure: Secondary | ICD-10-CM | POA: Diagnosis not present

## 2018-02-28 DIAGNOSIS — G20 Parkinson's disease: Secondary | ICD-10-CM

## 2018-02-28 DIAGNOSIS — I480 Paroxysmal atrial fibrillation: Secondary | ICD-10-CM

## 2018-02-28 DIAGNOSIS — I10 Essential (primary) hypertension: Secondary | ICD-10-CM

## 2018-02-28 DIAGNOSIS — Z794 Long term (current) use of insulin: Secondary | ICD-10-CM

## 2018-02-28 NOTE — Progress Notes (Signed)
Location:  [facility] Room Number: 212-A Place of Service:  SNF (31) Provider:  Kenard Gower, NP  Patient Care Team: [name], MD as PCP - General (Internal Medicine)  Extended Emergency Contact Information Primary Emergency Contact: Lemont Fillers of [place] Home Phone: [phone] Work Phone: [phone] Mobile Phone: [phone] Relation: Daughter Secondary Emergency Contact: Knapik,Russell Home Phone: [phone] Relation: Son  Code Status:  DNR  Goals of care: Advanced Directive information Advanced Directives 02/28/2018  Does Patient Have a Medical Advance Directive? Yes  Type of Advance Directive Out of facility DNR (pink MOST or yellow form)  Does patient want to make changes to medical advance directive? No - Patient declined  Copy of Healthcare Power of Attorney in Chart? -     Chief Complaint  Patient presents with  . Medical Management of Chronic Issues    The patient is seen for a routine [facility] visit    HPI:  Pt is a 83 y.o. female seen today for medical management of chronic diseases.  She is a long-term care resident of [facility].  She has a PMH of atrial fibrillation/flutter status post permanent pacemaker placement, diastolic CHF, dyslipidemia, hypertension, type 2 DM, and Parkinson's disease.    Past Medical History:  Diagnosis Date  . AF (atrial fibrillation) (HCC)   . Ankle fracture, right 1972  . Cellulitis of foot, right   . Diabetic retinopathy   . HTN (hypertension)   . Hyperlipidemia   . Low back pain   . Morbid obesity (HCC)   . Osteoporosis   . Parkinson disease (HCC)   . Type 2 diabetes mellitus (HCC) 1972  . Vitamin B12 deficiency    Past Surgical History:  Procedure Laterality Date  . CHOLECYSTECTOMY  1985  . PACEMAKER INSERTION  around 2010 per pt daughter   [employer]    Allergies  Allergen Reactions  . Penicillins Hives and Itching      Has patient had a PCN reaction causing immediate rash, facial/tongue/throat swelling, SOB or lightheadedness with hypotension: Yes Has patient had a PCN reaction causing severe rash involving mucus membranes or skin necrosis: No Has patient had a PCN reaction that required hospitalization: No Has patient had a PCN reaction occurring within the last 10 years: No If all of the above answers are "NO", then may proceed with Cephalosporin use.  . Diphenoxylate-Atropine Other (See Comments)    Unknown reaction  . Latex Itching and Rash    Outpatient Encounter Medications as of 02/28/2018  Medication Sig  . atorvastatin (LIPITOR) 20 MG tablet Take 20 mg by mouth daily at 6 PM.   . carbidopa-levodopa (SINEMET IR) 25-100 MG tablet Take 2 tablets by mouth 3 (three) times daily.  . Cholecalciferol (VITAMIN D-3) 1000 units CAPS Take 2,000 Units by mouth daily.   . cyanocobalamin 2000 MCG tablet Take 2,000 mcg by mouth daily.   . digoxin (LANOXIN) 0.125 MG tablet Take one tablet by mouth daily except for Wednesday and Sunday.  . fish oil-omega-3 fatty acids 1000 MG capsule Take 2 g by mouth at bedtime.   Marland Kitchen HYDROcodone-acetaminophen (NORCO/VICODIN) 5-325 MG tablet Take 1 tablet by mouth 2 (two) times daily as needed for moderate pain.  . Insulin Glargine (LANTUS SOLOSTAR [place]) Inject 15 Units into the skin at bedtime.   . insulin lispro (HUMALOG KWIKPEN) 100 UNIT/ML KiwkPen Inject 5-10 Units into the skin daily. SSI:  200-300 give 5 units, 301-400 give 10 units, >400 notify  provider  . L-Methylfolate-B6-B12 (FOLTANX PO) Take 1 tablet by mouth every evening.   . metoprolol tartrate (LOPRESSOR) 50 MG tablet Take 50 mg by mouth 2 (two) times daily. Hold for systolic BP <100  . Multiple Vitamins-Minerals (CENTRUM SILVER 50+WOMEN) TABS Take 1 tablet by mouth daily.  Marland Kitchen NUTRITIONAL SUPPLEMENT LIQD Take 120 mLs by mouth 2 (two) times daily. NSA MedPass  . POWDERS EX Apply 1 application topically 2 (two) times  daily. Microgard powder to perineum and abdominal folds  . [START ON 03/01/2018] warfarin (COUMADIN) 4 MG tablet Take 4 mg by mouth daily.  Marland Kitchen warfarin (COUMADIN) 5 MG tablet Take 5 mg by mouth See admin instructions. Take M-W-F  . [DISCONTINUED] warfarin (COUMADIN) 5 MG tablet Take 5 mg by mouth 2 (two) times daily.   No facility-administered encounter medications on file as of 02/28/2018.     Review of Systems  GENERAL: No change in appetite, no fatigue, no weight changes, no fever, chills or weakness MOUTH and THROAT: Denies oral discomfort, gingival pain or bleeding RESPIRATORY: no cough, SOB, DOE, wheezing, hemoptysis CARDIAC: No chest pain, edema or palpitations GI: No abdominal pain, diarrhea, constipation, heart burn, nausea or vomiting PSYCHIATRIC: Denies feelings of depression or anxiety. No report of hallucinations, insomnia, paranoia, or agitation    Immunization History  Administered Date(s) Administered  . Influenza-Unspecified 04/12/2017  . Pneumococcal-Unspecified 09/12/2014   Pertinent  Health Maintenance Due  Topic Date Due  . FOOT EXAM  10/15/1944  . OPHTHALMOLOGY EXAM  10/15/1944  . URINE MICROALBUMIN  10/15/1944  . PNA vac Low Risk Adult (2 of 2 - PCV13) 09/13/2015  . INFLUENZA VACCINE  04/12/2018  . HEMOGLOBIN A1C  07/15/2018  . DEXA SCAN  Discontinued   Fall Risk  06/27/2017  Falls in the past year? No      Vitals:   02/28/18 0925  BP: 138/69  Pulse: 60  Resp: 16  Temp: 98.8 F (37.1 C)  TempSrc: Oral  SpO2: 95%  Weight: 179 lb 3.2 oz (81.3 kg)  Height: 5\' 4"  (1.626 m)   Body mass index is 30.76 kg/m.  Physical Exam  GENERAL APPEARANCE: Well nourished. In no acute distress. Obese SKIN:  Skin is warm and dry.  MOUTH and THROAT: Lips are without lesions. Oral mucosa is moist and without lesions. Tongue is normal in shape, size, and color and without lesions RESPIRATORY: Breathing is even & unlabored, BS CTAB CARDIAC: RRR, no murmur,no  extra heart sounds, no edema, right chest pacemaker GI: Abdomen soft, normal BS, no masses, no tenderness EXTREMITIES:  Able to move X 4 extremities, BLE with generalized weakness PSYCHIATRIC: Alert and oriented X 3. Affect and behavior are appropriate   Labs reviewed: Recent Labs    12/01/17 0458 12/02/17 0721 12/04/17 0733 12/20/17  NA 143 141 140 141  K 4.3 4.4 4.1 3.8  CL 115* 112* 111  --   CO2 19* 17* 19*  --   GLUCOSE 44* 118* 117*  --   BUN 48* 37* 29* 24*  CREATININE 2.00* 1.63* 1.49* 1.0  CALCIUM 8.3* 8.3* 8.5*  --    Recent Labs    11/30/17 1800 12/20/17  AST 26 24  ALT 5* 9  ALKPHOS 87 103  BILITOT 0.4  --   PROT 6.9  --   ALBUMIN 2.8*  --    Recent Labs    11/30/17 1800  12/02/17 0721 12/03/17 0631 12/04/17 0733  WBC 16.5*   < > 14.5* 14.2* 13.5*  NEUTROABS 12.1*  --   --   --   --   HGB 11.6*   < > 10.9* 10.6* 10.7*  HCT 37.4   < > 35.1* 33.3* 34.1*  MCV 88.8   < > 87.8 87.6 87.4  PLT 371   < > 265 279 264   < > = values in this interval not displayed.   Lab Results  Component Value Date   TSH 2.184 Test methodology is 3rd generation TSH 07/31/2009   Lab Results  Component Value Date   HGBA1C 9 01/12/2018    Assessment/Plan  1. Paroxysmal atrial fibrillation (HCC) - has pacemaker, continue Metoprolol tartrate 50 mg BID, Coumadin 5 mg 1 tab Q M-W-F, 4 mg Q T-Th-S-S,    2. Parkinson disease (HCC) - no hallucinations noted, continue Sinemet 25-100 mg 2 tabs TID   3. Diabetes mellitus due to underlying condition with diabetic nephropathy, with long-term current use of insulin (HCC) - CBGs are well-controlled, continue Lantus 100 units/ml inject 15 units Q HS, Humalog 100 units/ml SSI TIDac Lab Results  Component Value Date   HGBA1C 9 01/12/2018     4. Chronic diastolic heart failure (HCC) - no SOB nor edema - continue Metoprolol tartrate 50 mg BID, Digoxin 0.125 mg daily except Wed and Sun   5. HYPERTENSION, BENIGN - well-controlled,  continue Metoprolol tartrate 50 mg BID     Family/ staff Communication: Discussed plan of care with resident.  Labs/tests ordered:  None  Goals of care:   Long-term care.   Kenard GowerMonina Medina-Vargas, NP [facility] [phone] (Monday-Friday 8:00 a.m. - 5:00 p.m.) [phone] (after hours)

## 2018-03-01 ENCOUNTER — Non-Acute Institutional Stay (SKILLED_NURSING_FACILITY): Payer: Medicare Other | Admitting: Adult Health

## 2018-03-01 ENCOUNTER — Encounter: Payer: Self-pay | Admitting: Adult Health

## 2018-03-01 DIAGNOSIS — Z7901 Long term (current) use of anticoagulants: Secondary | ICD-10-CM | POA: Diagnosis not present

## 2018-03-01 DIAGNOSIS — I480 Paroxysmal atrial fibrillation: Secondary | ICD-10-CM | POA: Diagnosis not present

## 2018-03-01 NOTE — Progress Notes (Signed)
Anticoagulation Patient is here for anticoagulation follow-up. Indication: PAF Bleeding Signs/Symptoms:  None Thromboembolic Signs/Symptoms: None Missed Coumadin Doses:  None Medication Changes: No Dietary Changes:  No Bacterial/Viral Infection:  No Other Concerns:  No INR 1.4, subtherapeutic  Will give Coumadin 7.5 mg PO X 1 today then Coumadin 5 mg daily, check INR on 03/05/18

## 2018-03-28 ENCOUNTER — Non-Acute Institutional Stay (SKILLED_NURSING_FACILITY): Payer: Medicare Other | Admitting: Adult Health

## 2018-03-28 ENCOUNTER — Encounter: Payer: Self-pay | Admitting: Adult Health

## 2018-03-28 DIAGNOSIS — Z7901 Long term (current) use of anticoagulants: Secondary | ICD-10-CM

## 2018-03-28 DIAGNOSIS — G20 Parkinson's disease: Secondary | ICD-10-CM | POA: Diagnosis not present

## 2018-03-28 DIAGNOSIS — I480 Paroxysmal atrial fibrillation: Secondary | ICD-10-CM

## 2018-03-28 DIAGNOSIS — Z794 Long term (current) use of insulin: Secondary | ICD-10-CM | POA: Diagnosis not present

## 2018-03-28 DIAGNOSIS — I5032 Chronic diastolic (congestive) heart failure: Secondary | ICD-10-CM

## 2018-03-28 DIAGNOSIS — E0821 Diabetes mellitus due to underlying condition with diabetic nephropathy: Secondary | ICD-10-CM | POA: Diagnosis not present

## 2018-03-28 DIAGNOSIS — G20A1 Parkinson's disease without dyskinesia, without mention of fluctuations: Secondary | ICD-10-CM

## 2018-03-28 NOTE — Progress Notes (Signed)
Location:  [facility] Room Number: 110-B Place of Service:  SNF (31) Provider:  Kenard Gower, NP  Patient Care Team: Pecola Lawless, MD as PCP - General (Internal Medicine) Gillis Santa, NP as Nurse Practitioner (Internal Medicine)  Extended Emergency Contact Information Primary Emergency Contact: Lemont Fillers of [place] Home Phone: [phone] Work Phone: [phone] Mobile Phone: [phone] Relation: Daughter Secondary Emergency Contact: Collison,Russell Home Phone: [phone] Relation: Son  Code Status:  DNR  Goals of care: Advanced Directive information Advanced Directives 03/01/2018  Does Patient Have a Medical Advance Directive? Yes  Type of Advance Directive Out of facility DNR (pink MOST or yellow form)  Does patient want to make changes to medical advance directive? No - Patient declined  Copy of Healthcare Power of Attorney in Chart? -     Chief Complaint  Patient presents with  . Medical Management of Chronic Issues    The patient is seen for a routine [facility] visit    HPI:  Pt is an 83 y.o. female seen today for medical management of chronic diseases.  She is a long-term care resident of [facility].  She has a PMH of atrial fibrillation/flutter status post permanent pacemaker placement, diastolic CHF, dyslipidemia, HTN, DM2, and Parkinson's disease. Latest INR 2.2,  Therapeutic. She was seen in her room today. She reported seeing bugs occasionally but said that they don't bother her. She denies seeing dead people.    Past Medical History:  Diagnosis Date  . AF (atrial fibrillation) (HCC)   . Ankle fracture, right 1972  . Cellulitis of foot, right   . Diabetic retinopathy   . HTN (hypertension)   . Hyperlipidemia   . Low back pain   . Morbid obesity (HCC)   . Osteoporosis   . Parkinson disease (HCC)   . Type 2 diabetes mellitus (HCC) 1972  . Vitamin B12  deficiency    Past Surgical History:  Procedure Laterality Date  . CHOLECYSTECTOMY  1985  . PACEMAKER INSERTION  around 2010 per pt daughter   [employer]    Allergies  Allergen Reactions  . Penicillins Hives and Itching    Has patient had a PCN reaction causing immediate rash, facial/tongue/throat swelling, SOB or lightheadedness with hypotension: Yes Has patient had a PCN reaction causing severe rash involving mucus membranes or skin necrosis: No Has patient had a PCN reaction that required hospitalization: No Has patient had a PCN reaction occurring within the last 10 years: No If all of the above answers are "NO", then may proceed with Cephalosporin use.  . Diphenoxylate-Atropine Other (See Comments)    Unknown reaction  . Latex Itching and Rash    Outpatient Encounter Medications as of 03/28/2018  Medication Sig  . atorvastatin (LIPITOR) 20 MG tablet Take 20 mg by mouth daily at 6 PM.   . carbidopa-levodopa (SINEMET IR) 25-100 MG tablet Take 2 tablets by mouth 3 (three) times daily.  . Cholecalciferol (VITAMIN D-3) 1000 units CAPS Take 2,000 Units by mouth daily.   . cyanocobalamin 2000 MCG tablet Take 2,000 mcg by mouth daily.   . digoxin (LANOXIN) 0.125 MG tablet Take one tablet by mouth daily except for Wednesday and Sunday.  . fish oil-omega-3 fatty acids 1000 MG capsule Take 2 g by mouth at bedtime.   Marland Kitchen HYDROcodone-acetaminophen (NORCO/VICODIN) 5-325 MG tablet Take 1 tablet by mouth 2 (two) times daily as needed for moderate pain.  . Insulin Glargine (LANTUS SOLOSTAR [place]) Inject  15 Units into the skin at bedtime.   . insulin lispro (HUMALOG KWIKPEN) 100 UNIT/ML KiwkPen Inject 5-10 Units into the skin daily. SSI:  200-300 give 5 units, 301-400 give 10 units, >400 notify provider  . L-Methylfolate-B6-B12 (FOLTANX PO) Take 1 tablet by mouth every evening.   . metoprolol tartrate (LOPRESSOR) 50 MG tablet Take 50 mg by mouth 2 (two) times daily. Hold for systolic BP <100  .  Multiple Vitamins-Minerals (CENTRUM SILVER 50+WOMEN) TABS Take 1 tablet by mouth daily.   Marland Kitchen. NUTRITIONAL SUPPLEMENT LIQD Take 120 mLs by mouth 2 (two) times daily. NSA MedPass  . POWDERS EX Apply 1 application topically 2 (two) times daily. Microgard powder to perineum and abdominal folds  . warfarin (COUMADIN) 4 MG tablet Take 4 mg by mouth See admin instructions. Take M-W-F  . warfarin (COUMADIN) 5 MG tablet Take 5 mg by mouth See admin instructions. Take Tue, Thu, Sat, Sun   No facility-administered encounter medications on file as of 03/28/2018.     Review of Systems  GENERAL: No change in appetite, no fatigue, no weight changes, no fever, chills or weakness MOUTH and THROAT: Denies oral discomfort, gingival pain or bleeding RESPIRATORY: no cough, SOB, DOE, wheezing, hemoptysis CARDIAC: No chest pain, or palpitations GI: No abdominal pain, diarrhea, constipation, heart burn, nausea or vomiting GU: Denies dysuria, frequency, hematuria, incontinence, or discharge PSYCHIATRIC: Denies feelings of depression or anxiety. No report of hallucinations, insomnia, paranoia, or agitation    Immunization History  Administered Date(s) Administered  . Influenza-Unspecified 04/12/2017  . Pneumococcal-Unspecified 09/12/2014   Pertinent  Health Maintenance Due  Topic Date Due  . PNA vac Low Risk Adult (2 of 2 - PCV13) 09/13/2015  . FOOT EXAM  03[birth year]02[birth year]2019 (Originally 10/15/1944)  . OPHTHALMOLOGY EXAM  03/02/2019 (Originally 10/15/1944)  . INFLUENZA VACCINE  04/12/2018  . HEMOGLOBIN A1C  07/15/2018  . URINE MICROALBUMIN  01/27/2019  . DEXA SCAN  Discontinued   Fall Risk  06/27/2017  Falls in the past year? No      Vitals:   03/28/18 1159  BP: 140/60  Pulse: 60  Resp: 20  Temp: 97.9 F (36.6 C)  TempSrc: Oral  SpO2: 93%  Weight: 176 lb 9.6 oz (80.1 kg)  Height: 5\' 4"  (1.626 m)   Body mass index is 30.31 kg/m.  Physical Exam  GENERAL APPEARANCE: Well nourished. In no acute distress.  Obese SKIN:  Left forearm with hematoma MOUTH and THROAT: Lips are without lesions. Oral mucosa is moist and without lesions. Tongue is normal in shape, size, and color and without lesions RESPIRATORY: Breathing is even & unlabored, BS CTAB CARDIAC: RRR, no murmur,no extra heart sounds, BLE 1-2+ edema, right chest pacemaker GI: Abdomen soft, normal BS, no masses, no tenderness EXTREMITIES:  Able to move X 4 extremities PSYCHIATRIC: Alert and oriented X 3. Affect and behavior are appropriate   Labs reviewed: Recent Labs    12/01/17 0458 12/02/17 0721 12/04/17 0733 12/20/17  NA 143 141 140 141  K 4.3 4.4 4.1 3.8  CL 115* 112* 111  --   CO2 19* 17* 19*  --   GLUCOSE 44* 118* 117*  --   BUN 48* 37* 29* 24*  CREATININE 2.00* 1.63* 1.49* 1.0  CALCIUM 8.3* 8.3* 8.5*  --    Recent Labs    11/30/17 1800 12/20/17  AST 26 24  ALT 5* 9  ALKPHOS 87 103  BILITOT 0.4  --   PROT 6.9  --   ALBUMIN  2.8*  --    Recent Labs    11/30/17 1800  12/02/17 0721 12/03/17 0631 12/04/17 0733  WBC 16.5*   < > 14.5* 14.2* 13.5*  NEUTROABS 12.1*  --   --   --   --   HGB 11.6*   < > 10.9* 10.6* 10.7*  HCT 37.4   < > 35.1* 33.3* 34.1*  MCV 88.8   < > 87.8 87.6 87.4  PLT 371   < > 265 279 264   < > = values in this interval not displayed.   Lab Results  Component Value Date   TSH 2.184 Test methodology is 3rd generation TSH 07/31/2009    Lab Results  Component Value Date   HGBA1C 9 01/12/2018    Assessment/Plan  1. Parkinson disease (HCC) - stable, continue Carbidopa-Levodopa 25-100 mg give 2 tabs TID   2. Paroxysmal atrial fibrillation (HCC) -  Rate-controlled, continue Coumadin,  Metoprolol Tartrate 50 mg BID, Digoxin 0.125 mg 1 tab daily except Wed and Sun   3. Diabetes mellitus due to underlying condition with diabetic nephropathy, with long-term current use of insulin (HCC) - continue Lantus 100 units/ml inject 15 units Q HS and Humalog Sliding scale SQ daily Lab Results    Component Value Date   HGBA1C 9 01/12/2018     4. Chronic diastolic heart failure (HCC) - no SOB, continue Metoprolol Tartrate 50 mg BID, Digoxin 0.125 mg 1 tab daily except Wed and Sun    5. Long-term use of anticoagulant - INR 2.2, therapeutic, continue Coumadin 5 mg Q Tues, Thurs, Sat, Sub, Coumadin 4 mg Q M-W-F   Family/ staff Communication: Discussed plan of care with the patient.  Labs/tests ordered:  INR 04/02/18  Goals of care:   Long-term care.   Kenard Gower, NP [facility] [phone] (Monday-Friday 8:00 a.m. - 5:00 p.m.) [phone] (after hours)

## 2018-04-04 ENCOUNTER — Encounter: Payer: Medicare Other | Admitting: *Deleted

## 2018-04-04 ENCOUNTER — Telehealth: Payer: Self-pay | Admitting: Cardiology

## 2018-04-04 NOTE — Telephone Encounter (Signed)
Attempted to confirm remote transmission with pt. No answer and was unable to leave a message.   

## 2018-04-05 ENCOUNTER — Encounter: Payer: Self-pay | Admitting: Cardiology

## 2018-04-05 NOTE — Progress Notes (Signed)
Letter  

## 2018-04-06 ENCOUNTER — Other Ambulatory Visit: Payer: Self-pay

## 2018-04-06 MED ORDER — HYDROCODONE-ACETAMINOPHEN 5-325 MG PO TABS: 1.0000 | ORAL_TABLET | Freq: Two times a day (BID) | ORAL | 0 refills | Status: DC | PRN

## 2018-04-06 NOTE — Telephone Encounter (Signed)
Monina Medina-Vargas, NP wrote hard script for Norco 5-326, 1 tab BID prn, #60.  I faxed it to pharmacy and notified nurse.

## 2018-04-09 LAB — POCT INR: INR: 2.5 — AB (ref 0.9–1.1)

## 2018-04-09 LAB — PROTIME-INR: Protime: 26.4 — AB (ref 10.0–13.8)

## 2018-04-13 ENCOUNTER — Ambulatory Visit (INDEPENDENT_AMBULATORY_CARE_PROVIDER_SITE_OTHER): Payer: Medicare Other | Admitting: *Deleted

## 2018-04-13 DIAGNOSIS — I480 Paroxysmal atrial fibrillation: Secondary | ICD-10-CM | POA: Diagnosis not present

## 2018-04-16 NOTE — Progress Notes (Signed)
Remote pacemaker transmission.   

## 2018-04-24 LAB — POCT INR: INR: 2.7 — AB (ref 0.9–1.1)

## 2018-04-24 LAB — PROTIME-INR: PROTIME: 28.2 — AB (ref 10.0–13.8)

## 2018-05-01 ENCOUNTER — Non-Acute Institutional Stay (SKILLED_NURSING_FACILITY): Payer: Medicare Other | Admitting: Internal Medicine

## 2018-05-01 ENCOUNTER — Encounter: Payer: Self-pay | Admitting: Internal Medicine

## 2018-05-01 DIAGNOSIS — G20 Parkinson's disease: Secondary | ICD-10-CM

## 2018-05-01 DIAGNOSIS — N189 Chronic kidney disease, unspecified: Secondary | ICD-10-CM | POA: Diagnosis not present

## 2018-05-01 DIAGNOSIS — Z794 Long term (current) use of insulin: Secondary | ICD-10-CM | POA: Diagnosis not present

## 2018-05-01 DIAGNOSIS — I10 Essential (primary) hypertension: Secondary | ICD-10-CM | POA: Diagnosis not present

## 2018-05-01 DIAGNOSIS — E0821 Diabetes mellitus due to underlying condition with diabetic nephropathy: Secondary | ICD-10-CM

## 2018-05-01 NOTE — Progress Notes (Signed)
NURSING HOME LOCATION:  [facility] ROOM NUMBER:  [id number]  CODE STATUS:  DNR  PCP:  Pecola LawlessHopper, Blaire Palomino F, MD  [address] [place] [id number]  This is a nursing facility follow up of chronic medical diagnoses.  Interim medical record and care since [facility] visit was updated with review of diagnostic studies and change in clinical status since last visit were documented.  HPI: The patient is a permanent resident of the facility with medical diagnoses of Parkinson's with hallucinations; insulin-dependent diabetes; hypertension; dyslipidemia; history of A. fib/a flutter; and chronic diastolic heart failure.  She has a permanent pacemaker. Glucoses continue to be markedly variable with low of 118 up to high of 339.  A1c of 9% on 01/12/2018 indicated poor control.  Review of systems: She fixates on her "pamper deteriorating into the tail line".  She states that this causes chronic discomfort.  Staff has not reported decubiti. She states she has had a bladder infection for 5 years but denies dysuria, hematuria or pyuria.  What she is describing is urinary incontinence. She denies active hallucinations at this time.  Constitutional: No fever, significant weight change, fatigue  Cardiovascular: No chest pain, palpitations, paroxysmal nocturnal dyspnea, claudication, edema  Respiratory: No cough, sputum production, hemoptysis, DOE, significant snoring, apnea   Gastrointestinal: No heartburn, dysphagia, abdominal pain, nausea /vomiting, rectal bleeding, melena, change in bowels Musculoskeletal: No joint stiffness, joint swelling, weakness Dermatologic: No rash, pruritus Neurologic: No dizziness, headache, syncope, seizures, numbness, tingling Psychiatric: No significant anxiety, depression, insomnia, anorexia Endocrine: No change in hair/skin/nails, excessive thirst, excessive hunger, excessive urination  Hematologic/lymphatic: No significant bruising,  lymphadenopathy, abnormal bleeding Allergy:no urticaria  Physical exam:  Pertinent or positive findings: Obesity present.She exhibits somewhat of a grimacing facial pattern.  She has bilateral ptosis.  She has multiple missing teeth superiorly and inferiorly. Pacer present over the right chest.  Breath sounds were decreased. Abdomen is protuberant. Intermittent of tremor of the left hand.  The SQ tissues of the upper extremities are markedly lax with decreased tone.  Stasis hyperpigmentation and keratoses over the shins.  There is exfoliation, scarring, and faint ecchymoses over the forearms.  Pedal pulses are palpable but decreased.  She has trace edema.  She is markedly weak to opposition in upper and lower extremities, but this is more pronounced in the lower extremities.  There are fusiform changes in the knees.  General appearance: no acute distress, increased work of breathing is present.   Lymphatic: No lymphadenopathy about the head, neck, axilla. Eyes: No conjunctival inflammation or lid edema is present. There is no scleral icterus. Ears:  External ear exam shows no significant lesions or deformities.   Nose:  External nasal examination shows no deformity or inflammation. Nasal mucosa are pink and moist without lesions, exudates Oral exam:  Lips and gums are healthy appearing. There is no oropharyngeal erythema or exudate. Neck:  No thyromegaly, masses, tenderness noted.    Heart:  Normal rate and regular rhythm. S1 and S2 normal without gallop, murmur, click, rub .  Lungs:  without wheezes, rhonchi, rales, rubs. Abdomen: Bowel sounds are normal. Abdomen is soft and nontender with no organomegaly, hernias, masses. GU: Deferred  Extremities:  No cyanosis, clubbing  Neurologic exam : Balance, Rhomberg, finger to nose testing could not be completed due to clinical state Skin: Warm & dry w/o tenting.  See summary under each active problem in the Problem List with associated updated  therapeutic plan

## 2018-05-01 NOTE — Assessment & Plan Note (Signed)
12/01/2017 creatinine 2.00, creatinine 1.0 on 12/20/2017 Recheck BMET

## 2018-05-01 NOTE — Assessment & Plan Note (Signed)
BP controlled; no change in antihypertensive medications  

## 2018-05-01 NOTE — Patient Instructions (Signed)
See assessment and plan under each diagnosis in the problem list and acutely for this visit 

## 2018-05-01 NOTE — Assessment & Plan Note (Signed)
As per Dr. Penumalli 

## 2018-05-01 NOTE — Assessment & Plan Note (Signed)
Update A1c ?

## 2018-05-02 ENCOUNTER — Encounter: Payer: Self-pay | Admitting: Internal Medicine

## 2018-05-03 ENCOUNTER — Encounter: Payer: Self-pay | Admitting: Internal Medicine

## 2018-05-03 DIAGNOSIS — F329 Major depressive disorder, single episode, unspecified: Secondary | ICD-10-CM | POA: Insufficient documentation

## 2018-05-03 DIAGNOSIS — F32A Depression, unspecified: Secondary | ICD-10-CM | POA: Insufficient documentation

## 2018-05-11 LAB — CUP PACEART REMOTE DEVICE CHECK
Battery Remaining Longevity: 46 mo
Battery Voltage: 2.77 V
Brady Statistic AP VP Percent: 22 %
Brady Statistic AS VP Percent: 68 %
Implantable Lead Implant Date: 20101119
Implantable Lead Location: 753859
Implantable Lead Model: 5076
Implantable Lead Model: 5076
Implantable Pulse Generator Implant Date: 20101119
Lead Channel Pacing Threshold Amplitude: 0.75 V
Lead Channel Pacing Threshold Amplitude: 0.875 V
Lead Channel Sensing Intrinsic Amplitude: 0.7 mV
Lead Channel Setting Pacing Amplitude: 2 V
Lead Channel Setting Pacing Amplitude: 2.5 V
Lead Channel Setting Pacing Pulse Width: 0.4 ms
MDC IDC LEAD IMPLANT DT: 20101119
MDC IDC LEAD LOCATION: 753860
MDC IDC MSMT BATTERY IMPEDANCE: 1375 Ohm
MDC IDC MSMT LEADCHNL RA IMPEDANCE VALUE: 453 Ohm
MDC IDC MSMT LEADCHNL RA PACING THRESHOLD PULSEWIDTH: 0.4 ms
MDC IDC MSMT LEADCHNL RV IMPEDANCE VALUE: 540 Ohm
MDC IDC MSMT LEADCHNL RV PACING THRESHOLD PULSEWIDTH: 0.4 ms
MDC IDC SESS DTM: 20190802182854
MDC IDC SET LEADCHNL RV SENSING SENSITIVITY: 2 mV
MDC IDC STAT BRADY AP VS PERCENT: 0 %
MDC IDC STAT BRADY AS VS PERCENT: 10 %

## 2018-05-17 ENCOUNTER — Encounter: Payer: Self-pay | Admitting: Internal Medicine

## 2018-05-21 LAB — PROTIME-INR: Protime: 23.2 — AB (ref 10.0–13.8)

## 2018-05-21 LAB — POCT INR: INR: 2.1 — AB (ref 0.9–1.1)

## 2018-05-28 LAB — PROTIME-INR: PROTIME: 27 — AB (ref 10.0–13.8)

## 2018-05-28 LAB — POCT INR: INR: 2.6 — AB (ref 0.9–1.1)

## 2018-05-31 ENCOUNTER — Non-Acute Institutional Stay (SKILLED_NURSING_FACILITY): Payer: Medicare Other | Admitting: Adult Health

## 2018-05-31 ENCOUNTER — Encounter: Payer: Self-pay | Admitting: Adult Health

## 2018-05-31 DIAGNOSIS — E782 Mixed hyperlipidemia: Secondary | ICD-10-CM

## 2018-05-31 DIAGNOSIS — I5032 Chronic diastolic (congestive) heart failure: Secondary | ICD-10-CM | POA: Diagnosis not present

## 2018-05-31 DIAGNOSIS — G20 Parkinson's disease: Secondary | ICD-10-CM | POA: Diagnosis not present

## 2018-05-31 DIAGNOSIS — I480 Paroxysmal atrial fibrillation: Secondary | ICD-10-CM | POA: Diagnosis not present

## 2018-05-31 DIAGNOSIS — E1165 Type 2 diabetes mellitus with hyperglycemia: Secondary | ICD-10-CM

## 2018-05-31 DIAGNOSIS — F329 Major depressive disorder, single episode, unspecified: Secondary | ICD-10-CM

## 2018-05-31 DIAGNOSIS — I10 Essential (primary) hypertension: Secondary | ICD-10-CM

## 2018-05-31 NOTE — Progress Notes (Signed)
Location:  [facility] Room Number: 110-B Place of Service:  SNF (31) Provider:  Kenard Gower, NP  Patient Care Team: Pecola Lawless, MD as PCP - General (Internal Medicine) Gillis Santa, NP as Nurse Practitioner (Internal Medicine)  Extended Emergency Contact Information Primary Emergency Contact: Lemont Fillers of [place] Home Phone: [phone] Work Phone: [phone] Mobile Phone: [phone] Relation: Daughter Secondary Emergency Contact: Halpin,Russell Home Phone: [phone] Relation: Son  Code Status:  DNR  Goals of care: Advanced Directive information Advanced Directives 05/31/2018  Does Patient Have a Medical Advance Directive? Yes  Type of Advance Directive Out of facility DNR (pink MOST or yellow form)  Does patient want to make changes to medical advance directive? No - Patient declined  Copy of Healthcare Power of Attorney in Chart? -     Chief Complaint  Patient presents with  . Medical Management of Chronic Issues    The patient is seen for a routine [facility] visit    HPI:  Pt is an 83 y.o. female seen today for medical management of chronic diseases.  She is a long-term care resident of [facility].  She has a PMH of atrial fibrillation/flutter status post permanent pacemaker placement, diastolic congestive heart failure, dyslipidemia, hypertension, DM2, and Parkinson's disease. She was seen in her room today. CBGs were noted to be elevated - 221, 226, 182, 215.    Past Medical History:  Diagnosis Date  . AF (atrial fibrillation) (HCC)   . Ankle fracture, right 1972  . Cellulitis of foot, right   . Diabetic retinopathy   . HTN (hypertension)   . Hyperlipidemia   . Low back pain   . Morbid obesity (HCC)   . Osteoporosis   . Parkinson disease (HCC)   . Type 2 diabetes mellitus (HCC) 1972  . Vitamin B12 deficiency    Past Surgical History:  Procedure  Laterality Date  . CHOLECYSTECTOMY  1985  . PACEMAKER INSERTION  around 2010 per pt daughter   [employer]    Allergies  Allergen Reactions  . Penicillins Hives and Itching    Has patient had a PCN reaction causing immediate rash, facial/tongue/throat swelling, SOB or lightheadedness with hypotension: Yes Has patient had a PCN reaction causing severe rash involving mucus membranes or skin necrosis: No Has patient had a PCN reaction that required hospitalization: No Has patient had a PCN reaction occurring within the last 10 years: No If all of the above answers are "NO", then may proceed with Cephalosporin use.  . Diphenoxylate-Atropine Other (See Comments)    Unknown reaction  . Latex Itching and Rash    Outpatient Encounter Medications as of 05/31/2018  Medication Sig  . atorvastatin (LIPITOR) 20 MG tablet Take 20 mg by mouth daily at 6 PM.   . carbidopa-levodopa (SINEMET IR) 25-100 MG tablet Take 2 tablets by mouth 3 (three) times daily.  . Cholecalciferol (VITAMIN D-3) 1000 units CAPS Take 2,000 Units by mouth daily.   . cyanocobalamin 2000 MCG tablet Take 2,000 mcg by mouth daily.   . digoxin (LANOXIN) 0.125 MG tablet Take one tablet by mouth daily except for Wednesday and Sunday.  . escitalopram (LEXAPRO) 10 MG tablet Take 15 mg by mouth daily. Take 1-1/2 tablets qd to = 15 mg qd  . fish oil-omega-3 fatty acids 1000 MG capsule Take 2 g by mouth at bedtime.   Marland Kitchen HYDROcodone-acetaminophen (NORCO/VICODIN) 5-325 MG tablet Take 1 tablet by mouth 2 (two) times daily  as needed for moderate pain.  . Insulin Glargine (LANTUS SOLOSTAR [place]) Inject 15 Units into the skin at bedtime.   . insulin lispro (HUMALOG KWIKPEN) 100 UNIT/ML KiwkPen Inject 5-10 Units into the skin daily. SSI:  200-300 give 5 units, 301-400 give 10 units, >400 notify provider  . L-Methylfolate-B6-B12 (FOLTANX PO) Take 1 tablet by mouth every evening.   . metoprolol tartrate (LOPRESSOR) 50 MG tablet Take 50 mg by mouth 2  (two) times daily. Hold for systolic BP <100  . Multiple Vitamins-Minerals (CENTRUM SILVER 50+WOMEN) TABS Take 1 tablet by mouth daily.   Marland Kitchen. NUTRITIONAL SUPPLEMENT LIQD Take 120 mLs by mouth 2 (two) times daily. NSA MedPass  . POWDERS EX Apply 1 application topically 2 (two) times daily. Microgard powder to perineum and abdominal folds  . warfarin (COUMADIN) 2.5 MG tablet Take 2.5 mg by mouth See admin instructions. Take Tue-Thu-Sat  . warfarin (COUMADIN) 5 MG tablet Take 5 mg by mouth See admin instructions. Take M-W-F-Sunday  . [DISCONTINUED] warfarin (COUMADIN) 4 MG tablet Take 4 mg by mouth See admin instructions. Take Sun-M-W-F   No facility-administered encounter medications on file as of 05/31/2018.     Review of Systems  GENERAL: No change in appetite, no fatigue, no weight changes, no fever, chills or weakness MOUTH and THROAT: Denies oral discomfort, gingival pain or bleeding RESPIRATORY: no cough, SOB, DOE, wheezing, hemoptysis CARDIAC: No chest pain, edema or palpitations GI: No abdominal pain, diarrhea, constipation, heart burn, nausea or vomiting GU: Denies dysuria, frequency, hematuria, or discharge PSYCHIATRIC: Denies feelings of depression or anxiety. No report of hallucinations, insomnia, paranoia, or agitation   Immunization History  Administered Date(s) Administered  . Influenza-Unspecified 04/12/2017  . Pneumococcal-Unspecified 09/12/2014   Pertinent  Health Maintenance Due  Topic Date Due  . PNA vac Low Risk Adult (2 of 2 - PCV13) 09/13/2015  . INFLUENZA VACCINE  04/12/2018  . FOOT EXAM  03/02[birth year]2019 (Originally 10/15/1944)  . OPHTHALMOLOGY EXAM  03/02/2019 (Originally 10/15/1944)  . HEMOGLOBIN A1C  07/15/2018  . URINE MICROALBUMIN  01/27/2019  . DEXA SCAN  Discontinued   Fall Risk  06/27/2017  Falls in the past year? No      Vitals:   05/31/18 1042  BP: (!) 142/80  Pulse: (!) 59  Resp: 20  Temp: 98.7 F (37.1 C)  TempSrc: Oral  SpO2: 96%  Weight: 174  lb 3.2 oz (79 kg)  Height: 5\' 4"  (1.626 m)   Body mass index is 29.9 kg/m.  Physical Exam  GENERAL APPEARANCE: Well nourished. In no acute distress. Normal body habitus SKIN:  Skin is warm and dry.  MOUTH and THROAT: Lips are without lesions. Oral mucosa is moist and without lesions. Tongue is normal in shape, size, and color and without lesions RESPIRATORY: Breathing is even & unlabored, BS CTAB CARDIAC: RRR, no murmur,no extra heart sounds, no edema, right chest pacemaker GI: Abdomen soft, normal BS, no masses, no tenderness EXTREMITIES: Able to move X 4 extremities, BLE with generalized NEUROLOGICAL: There is no tremor. Speech is clear PSYCHIATRIC: Alert and oriented X 3. Affect and behavior are appropriate   Labs reviewed: Recent Labs    12/01/17 0458 12/02/17 0721 12/04/17 0733 12/20/17  NA 143 141 140 141  K 4.3 4.4 4.1 3.8  CL 115* 112* 111  --   CO2 19* 17* 19*  --   GLUCOSE 44* 118* 117*  --   BUN 48* 37* 29* 24*  CREATININE 2.00* 1.63* 1.49* 1.0  CALCIUM  8.3* 8.3* 8.5*  --    Recent Labs    11/30/17 1800 12/20/17  AST 26 24  ALT 5* 9  ALKPHOS 87 103  BILITOT 0.4  --   PROT 6.9  --   ALBUMIN 2.8*  --    Recent Labs    11/30/17 1800  12/02/17 0721 12/03/17 0631 12/04/17 0733  WBC 16.5*   < > 14.5* 14.2* 13.5*  NEUTROABS 12.1*  --   --   --   --   HGB 11.6*   < > 10.9* 10.6* 10.7*  HCT 37.4   < > 35.1* 33.3* 34.1*  MCV 88.8   < > 87.8 87.6 87.4  PLT 371   < > 265 279 264   < > = values in this interval not displayed.   Lab Results  Component Value Date   TSH 2.184 Test methodology is 3rd generation TSH 07/31/2009   Lab Results  Component Value Date   HGBA1C 9 01/12/2018    Assessment/Plan  1. Uncontrolled type 2 diabetes mellitus with hyperglycemia (HCC) - CBGs were noted to be elevated, will increase Lantus 100 units/ml from 15 units to 18 units Q HS, Humalog 100 units/ml inject 5 units subcutaneous for CBG 301-400 and 10 units SQ for CBG  >400 Lab Results  Component Value Date   HGBA1C 9 01/12/2018    2. Chronic diastolic heart failure (HCC) - no SOB, continue digoxin 0.125 mg 1 tab daily except Wednesday and Sunday, 2 tabs = 0.250 mg Q Wednesday and Sunday   3. Parkinson disease (HCC) - Sinemet 25-100 mg 2 tabs TID   4. Paroxysmal atrial fibrillation (HCC) - rate-controlled, INR 2.6, therapeutic, continue Coumadin mg 1 tab daily Tuesdays, Thursday and Saturday, Coumadin 5 mg 1 tab every Monday, Wednesday, Friday, and Sunday   5. Essential hypertension - well-controlled, continue Metoprolol tartrate 50 mg 1 tab twice a day   6. Major depressive disorder, remission status unspecified, unspecified whether recurrent - mood this is stable, continue Lexapro 10 mg 1 1/2 tab = 15 mg daily   7. HYPERLIPIDEMIA-MIXED - continue atorvastatin 20 mg 1 tab every evening      Family/ staff Communication: Discussed plan of care with resident.  Labs/tests ordered:  Lipid panel,CBC, CMP  Goals of care:   Long-term care.   Kenard Gower, NP [facility] [phone] (Monday-Friday 8:00 a.m. - 5:00 p.m.) [phone] (after hours)

## 2018-06-01 LAB — BASIC METABOLIC PANEL
BUN: 31 — AB (ref 4–21)
Creatinine: 1 (ref 0.5–1.1)
GLUCOSE: 111
POTASSIUM: 3.9 (ref 3.4–5.3)
SODIUM: 141 (ref 137–147)

## 2018-06-01 LAB — LIPID PANEL
Cholesterol: 89 (ref 0–200)
HDL: 40 (ref 35–70)
LDL Cholesterol: 38
LDl/HDL Ratio: 2.2
Triglycerides: 58 (ref 40–160)

## 2018-06-01 LAB — HEPATIC FUNCTION PANEL
ALT: 5 — AB (ref 7–35)
AST: 23 (ref 13–35)
Alkaline Phosphatase: 103 (ref 25–125)
Bilirubin, Total: 0.3

## 2018-06-01 LAB — CBC AND DIFFERENTIAL
HEMATOCRIT: 35 — AB (ref 36–46)
Hemoglobin: 11.5 — AB (ref 12.0–16.0)
NEUTROS ABS: 7
PLATELETS: 322 (ref 150–399)
WBC: 11.3

## 2018-06-04 LAB — POCT INR: INR: 2 — AB (ref 0.9–1.1)

## 2018-06-05 ENCOUNTER — Encounter: Payer: Self-pay | Admitting: Internal Medicine

## 2018-06-05 ENCOUNTER — Non-Acute Institutional Stay (SKILLED_NURSING_FACILITY): Payer: Medicare Other | Admitting: Internal Medicine

## 2018-06-05 DIAGNOSIS — J189 Pneumonia, unspecified organism: Secondary | ICD-10-CM | POA: Insufficient documentation

## 2018-06-05 DIAGNOSIS — J9811 Atelectasis: Secondary | ICD-10-CM | POA: Insufficient documentation

## 2018-06-05 DIAGNOSIS — D72829 Elevated white blood cell count, unspecified: Secondary | ICD-10-CM | POA: Diagnosis not present

## 2018-06-05 NOTE — Progress Notes (Signed)
NURSING HOME LOCATION:  [facility] ROOM NUMBER:  110-B  CODE STATUS:  DNR  PCP:  Pecola LawlessHopper, Maedell Hedger F, MD  [address] [place] [id number]  This is a nursing facility follow up for specific acute issue of abnormal chest x-ray.  Interim medical record and care since last [facility] visit was updated with review of diagnostic studies and change in clinical status since last visit were documented.  HPI: The patient had mobile imaging performed 06/04/2018; the reason is unclear.  The patient did not know why the x-ray was performed.  The reason given on the x-ray report for the film was "leukocytosis and elevated white count".  White count was 11,300 with no left shift.  There was minimal elevation of basophils. The interpretation was bilateral infiltrates; the on-call provider ordered doxycycline 100 mg twice daily for 7 days.  The interpretation goes on to state that the x-ray findings were unchanged compared to April 8,2019.  The film was personally reviewed.  There is no suggestion of infiltrate in the right lower lobe.  There is dramatic atelectasis on the left.  The most striking finding is massive cardiomegaly. The patient does describe a chronic cough for several months which is nonproductive.  She states that she is intermittently cold but has no other pulmonary or constitutional symptoms.  She has never smoked and has no history of asthma.  She does state that she will "get strangled" intermittently on liquids. The last chest x-ray in Epic was 12/11/2016.  This revealed dense left basilar atelectasis versus scarring.  Serial glucoses at the SNF reveal a range of 80- 289.  The 289 is an outlier as the other values are all less than 250.  The last A1c was 9% on 01/12/2018.  Review of systems:  Date given as June 03, 2018. Constitutional: No fever, significant weight change, fatigue  Eyes: No redness, discharge, pain, vision change ENT/mouth: No nasal  congestion,  purulent discharge, earache, change in hearing, sore throat  Cardiovascular: No chest pain, palpitations, paroxysmal nocturnal dyspnea, claudication, edema  Respiratory: No sputum production, hemoptysis, significant snoring, apnea   Gastrointestinal: No heartburn, abdominal pain, nausea /vomiting, rectal bleeding, melena, change in bowels Genitourinary: No dysuria, hematuria, pyuria, incontinence, nocturia Musculoskeletal: No joint stiffness, joint swelling, weakness, pain Dermatologic: No rash, pruritus, change in appearance of skin Neurologic: No dizziness, headache, syncope, seizures, numbness, tingling Endocrine: No change in hair/skin/nails, excessive thirst, excessive hunger, excessive urination  Hematologic/lymphatic: No significant bruising, lymphadenopathy, abnormal bleeding Allergy/immunology: No itchy/watery eyes, significant sneezing, urticaria, angioedema  Physical exam:  Pertinent or positive findings: The most striking finding is that her head & neck are deviated to the left suggesting torticollis.  She has multiple missing teeth, this is most striking of the mandible.  Breath sounds are decreased.  She has a grade 1 systolic murmur at the left base.  Abdomen is protuberant and the abdomen presses on the thorax.  Pacer is present over the right anterior chest. Tremor of L hand present. Clubbing of the nailbeds is present.  She has a broken right thumbnail.  Pedal pulses are decreased.  She has scarring over the forearms with hyperpigmentation of the left forearm.  Contracture of the fifth left digit.  She has severe keratotic changes of the lower extremities.  General appearance:  no acute distress, increased work of breathing is present.   Lymphatic: No lymphadenopathy about the head, neck, axilla. Eyes: No conjunctival inflammation or lid edema is present. There  is no scleral icterus. Ears:  External ear exam shows no significant lesions or deformities.   Nose:   External nasal examination shows no deformity or inflammation. Nasal mucosa are pink and moist without lesions, exudates Oral exam:  Lips and gums are healthy appearing. There is no oropharyngeal erythema or exudate. Neck:  No thyromegaly, masses, tenderness noted.    Heart:  Normal rate and regular rhythm. S1 and S2 normal without gallop, click, rub .  Lungs:  without wheezes, rhonchi, rales, rubs. Abdomen: Bowel sounds are normal. Abdomen is soft and nontender with no organomegaly, hernias, masses. GU: Deferred  Extremities:  No cyanosis,  edema  Skin: Warm & dry w/o tenting.  See summary under each active problem in the Problem List with associated updated therapeutic plan  '

## 2018-06-05 NOTE — Assessment & Plan Note (Addendum)
No indication for antibiotics from respiratory standpoint

## 2018-06-05 NOTE — Assessment & Plan Note (Signed)
Discontinue doxycycline as no clinical evidence of pneumonia Possible UTI, C&S pending

## 2018-06-05 NOTE — Patient Instructions (Signed)
See assessment and plan under each diagnosis in the problem list and acutely for this visit 

## 2018-06-06 ENCOUNTER — Encounter: Payer: Self-pay | Admitting: Internal Medicine

## 2018-06-06 LAB — HEMOGLOBIN A1C: Hemoglobin A1C: 9

## 2018-06-18 LAB — POCT INR: INR: 2.5 — AB (ref 0.9–1.1)

## 2018-06-18 LAB — PROTIME-INR: PROTIME: 26.5 — AB (ref 10.0–13.8)

## 2018-06-22 ENCOUNTER — Encounter: Payer: Self-pay | Admitting: Adult Health

## 2018-06-22 ENCOUNTER — Non-Acute Institutional Stay (SKILLED_NURSING_FACILITY): Payer: Medicare Other | Admitting: Adult Health

## 2018-06-22 DIAGNOSIS — Z794 Long term (current) use of insulin: Secondary | ICD-10-CM

## 2018-06-22 DIAGNOSIS — G20 Parkinson's disease: Secondary | ICD-10-CM

## 2018-06-22 DIAGNOSIS — E538 Deficiency of other specified B group vitamins: Secondary | ICD-10-CM

## 2018-06-22 DIAGNOSIS — I480 Paroxysmal atrial fibrillation: Secondary | ICD-10-CM | POA: Diagnosis not present

## 2018-06-22 DIAGNOSIS — E782 Mixed hyperlipidemia: Secondary | ICD-10-CM

## 2018-06-22 DIAGNOSIS — E0821 Diabetes mellitus due to underlying condition with diabetic nephropathy: Secondary | ICD-10-CM | POA: Diagnosis not present

## 2018-06-22 DIAGNOSIS — F329 Major depressive disorder, single episode, unspecified: Secondary | ICD-10-CM

## 2018-06-22 DIAGNOSIS — I10 Essential (primary) hypertension: Secondary | ICD-10-CM

## 2018-06-22 LAB — PROTIME-INR: PROTIME: 23.9 — AB (ref 10.0–13.8)

## 2018-06-22 LAB — POCT INR: INR: 2.2 — AB (ref 0.9–1.1)

## 2018-06-22 NOTE — Progress Notes (Signed)
Location:  [facility] Room Number: 110-B Place of Service:  SNF (31) Provider:  Kenard Gower, NP  Patient Care Team: Pecola Lawless, MD as PCP - General (Internal Medicine) Gillis Santa, NP as Nurse Practitioner (Internal Medicine)  Extended Emergency Contact Information Primary Emergency Contact: Lemont Fillers of [place] Home Phone: [phone] Work Phone: [phone] Mobile Phone: [phone] Relation: Daughter Secondary Emergency Contact: Luiz,Russell Home Phone: [phone] Relation: Son  Code Status:  DNR  Goals of care: Advanced Directive information Advanced Directives 06/05/2018  Does Patient Have a Medical Advance Directive? Yes  Type of Advance Directive Out of facility DNR (pink MOST or yellow form)  Does patient want to make changes to medical advance directive? No - Patient declined  Copy of Healthcare Power of Attorney in Chart? -     Chief Complaint  Patient presents with  . Medical Management of Chronic Issues    Routine [facility] visit    HPI:  Pt is an 83 y.o. female seen today for medical management of chronic diseases.  She is a long-term care resident of [facility].  She has a PMH of atrial fibrillation/flutter status post permanent pacemaker, diastolic congestive heart failure, dyslipidemia, HTN, DM2, and Parkinson's disease. She was seen in the room today. She denies seeing things which are not supposed to be there. CBGs  Are 157, 234, 73.    Past Medical History:  Diagnosis Date  . AF (atrial fibrillation) (HCC)   . Ankle fracture, right 1972  . Cellulitis of foot, right   . Diabetic retinopathy   . HTN (hypertension)   . Hyperlipidemia   . Low back pain   . Morbid obesity (HCC)   . Osteoporosis   . Parkinson disease (HCC)   . Type 2 diabetes mellitus (HCC) 1972  . Vitamin B12 deficiency    Past Surgical History:  Procedure Laterality Date   . CHOLECYSTECTOMY  1985  . PACEMAKER INSERTION  around 2010 per pt daughter   [employer]    Allergies  Allergen Reactions  . Penicillins Hives and Itching    Has patient had a PCN reaction causing immediate rash, facial/tongue/throat swelling, SOB or lightheadedness with hypotension: Yes Has patient had a PCN reaction causing severe rash involving mucus membranes or skin necrosis: No Has patient had a PCN reaction that required hospitalization: No Has patient had a PCN reaction occurring within the last 10 years: No If all of the above answers are "NO", then may proceed with Cephalosporin use.  . Diphenoxylate-Atropine Other (See Comments)    Unknown reaction  . Latex Itching and Rash    Outpatient Encounter Medications as of 06/22/2018  Medication Sig  . atorvastatin (LIPITOR) 20 MG tablet Take 20 mg by mouth daily at 6 PM.   . carbidopa-levodopa (SINEMET IR) 25-100 MG tablet Take 2 tablets by mouth 3 (three) times daily.  . Cholecalciferol (VITAMIN D-3) 1000 units CAPS Take 2,000 Units by mouth daily.   . cyanocobalamin 2000 MCG tablet Take 2,000 mcg by mouth daily.   . digoxin (LANOXIN) 0.125 MG tablet Take one tablet by mouth daily except for Wednesday and Sunday.  . escitalopram (LEXAPRO) 10 MG tablet Take 15 mg by mouth daily. Take 1-1/2 tablets qd to = 15 mg qd  . fish oil-omega-3 fatty acids 1000 MG capsule Take 2 g by mouth at bedtime.   Marland Kitchen HYDROcodone-acetaminophen (NORCO/VICODIN) 5-325 MG tablet Take 1 tablet by mouth 2 (two) times daily as  needed for moderate pain.  . Insulin Glargine (LANTUS SOLOSTAR [place]) Inject 18 Units into the skin at bedtime.   . insulin lispro (HUMALOG KWIKPEN) 100 UNIT/ML KiwkPen Inject 5-10 Units into the skin daily. SSI:  200-300 give 5 units, 301-400 give 10 units, >400 notify provider  . L-Methylfolate-B6-B12 (FOLTANX PO) Take 1 tablet by mouth every evening.   . metoprolol tartrate (LOPRESSOR) 50 MG tablet Take 50 mg by mouth 2 (two) times daily.  Hold for systolic BP <100  . Multiple Vitamins-Minerals (CENTRUM SILVER 50+WOMEN) TABS Take 1 tablet by mouth daily.   Marland Kitchen NUTRITIONAL SUPPLEMENT LIQD Take 120 mLs by mouth 2 (two) times daily. NSA MedPass  . POWDERS EX Apply 1 application topically 2 (two) times daily. Microgard powder to perineum and abdominal folds  . warfarin (COUMADIN) 2.5 MG tablet Take 2.5 mg by mouth See admin instructions. Take Tue-Thu-Sat  . warfarin (COUMADIN) 5 MG tablet Take 5 mg by mouth See admin instructions. Take M-W-F-Sunday   No facility-administered encounter medications on file as of 06/22/2018.     Review of Systems  GENERAL: No change in appetite, no fatigue, no weight changes, no fever, chills or weakness MOUTH and THROAT: Denies oral discomfort, gingival pain or bleeding RESPIRATORY: no cough, SOB, DOE, wheezing, hemoptysis CARDIAC: No chest pain, edema or palpitations GI: No abdominal pain, diarrhea, constipation, heart burn, nausea or vomiting GU: Denies dysuria, frequency, hematuria, incontinence, or discharge PSYCHIATRIC: Denies feelings of depression or anxiety. No report of hallucinations, insomnia, paranoia, or agitation   Immunization History  Administered Date(s) Administered  . Influenza-Unspecified 04/12/2017  . Pneumococcal-Unspecified 09/12/2014   Pertinent  Health Maintenance Due  Topic Date Due  . PNA vac Low Risk Adult (2 of 2 - PCV13) 09/13/2015  . INFLUENZA VACCINE  04/12/2018  . FOOT EXAM  03/02/2019 (Originally 10/15/1944)  . OPHTHALMOLOGY EXAM  03/02/2019 (Originally 10/15/1944)  . HEMOGLOBIN A1C  12/05/2018  . URINE MICROALBUMIN  01/27/2019  . DEXA SCAN  Discontinued   Fall Risk  06/27/2017  Falls in the past year? No     Vitals:   06/22/18 1003  BP: 132/73  Pulse: 60  Resp: 20  Temp: 98.5 F (36.9 C)  TempSrc: Oral  Weight: 181 lb 3.2 oz (82.2 kg)  Height: 5\' 4"  (1.626 m)   Body mass index is 31.1 kg/m.  Physical Exam  GENERAL APPEARANCE: Well  nourished. In no acute distress. Obese SKIN:  Skin is warm and dry.  MOUTH and THROAT: Lips are without lesions. Oral mucosa is moist and without lesions. Tongue is normal in shape, size, and color and without lesions RESPIRATORY: Breathing is even & unlabored, BS CTAB CARDIAC: RRR, no murmur,no extra heart sounds, no edema, right chest with pacemaker GI: Abdomen soft, normal BS, no masses, no tenderness NEUROLOGICAL: There is no tremor. Speech is clear PSYCHIATRIC: Alert and oriented X 3. Affect and behavior are appropriate   Labs reviewed: Recent Labs    12/01/17 0458 12/02/17 0721 12/04/17 0733 12/20/17 06/01/18  NA 143 141 140 141 141  K 4.3 4.4 4.1 3.8 3.9  CL 115* 112* 111  --   --   CO2 19* 17* 19*  --   --   GLUCOSE 44* 118* 117*  --   --   BUN 48* 37* 29* 24* 31*  CREATININE 2.00* 1.63* 1.49* 1.0 1.0  CALCIUM 8.3* 8.3* 8.5*  --   --    Recent Labs    11/30/17 1800 12/20/17 06/01/18  AST 26 24 23   ALT 5* 9 5*  ALKPHOS 87 103 103  BILITOT 0.4  --   --   PROT 6.9  --   --   ALBUMIN 2.8*  --   --    Recent Labs    11/30/17 1800  12/02/17 0721 12/03/17 0631 12/04/17 0733 06/01/18  WBC 16.5*   < > 14.5* 14.2* 13.5* 11.3  NEUTROABS 12.1*  --   --   --   --  7  HGB 11.6*   < > 10.9* 10.6* 10.7* 11.5*  HCT 37.4   < > 35.1* 33.3* 34.1* 35*  MCV 88.8   < > 87.8 87.6 87.4  --   PLT 371   < > 265 279 264 322   < > = values in this interval not displayed.   Lab Results  Component Value Date   TSH 2.184 Test methodology is 3rd generation TSH 07/31/2009   Lab Results  Component Value Date   HGBA1C 9.0 06/06/2018   Lab Results  Component Value Date   CHOL 89 06/01/2018   HDL 40 06/01/2018   LDLCALC 38 06/01/2018   TRIG 58 06/01/2018    Assessment/Plan  1. Parkinson disease (HCC) - denies having hallucinations, continue carbidopa-levodopa 25-100 mg 2 tabs 3 times a day   2. Paroxysmal atrial fibrillation (HCC) -rate controlled, continue metoprolol tartrate  50 mg 1 tab twice a day, Coumadin 2.5 mg 1 tab daily Tuesdays, Thursdays and Saturdays, 5 mg q. MWF and Sunday, digoxin 0.125 mg 1 tab daily except Wednesday and Sunday when she gets 2 tabs of 0.125 mg = 0.250 mg Q Wednesday and Sunday   3. HYPERLIPIDEMIA-MIXED -continue atorvastatin 20 mg 1 tab q. evening Lab Results  Component Value Date   CHOL 89 06/01/2018   HDL 40 06/01/2018   LDLCALC 38 06/01/2018   TRIG 58 06/01/2018     4. 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 Essential hypertension -well-controlled, continue metoprolol tartrate 50 mg 1 tab twice a day   6. Major depressive disorder, remission status unspecified, unspecified whether recurrent - mood is stable, continue Lexapro 10 mg 1 1/2 tab = 15 mg daily    7. Vitamin B12 deficiency -continue vitamin B12 1000 mcg 2 tabs = 2000 mcg p.o. daily    Family/ staff Communication:  Discussed plan of care with resident.  Labs/tests ordered:  None  Goals of care:  Long-term care.   Kenard Gower, NP [facility] [phone] (Monday-Friday 8:00 a.m. - 5:00 p.m.) [phone] (after hours)

## 2018-06-28 ENCOUNTER — Non-Acute Institutional Stay (SKILLED_NURSING_FACILITY): Payer: Medicare Other

## 2018-06-28 DIAGNOSIS — Z0000 Encounter for general adult medical examination without abnormal findings: Secondary | ICD-10-CM

## 2018-06-28 NOTE — Patient Instructions (Signed)
[name] , Thank you for taking time to come for your Medicare Wellness Visit. I appreciate your ongoing commitment to your health goals. Please review the following plan we discussed and let me know if I can assist you in the future.   Screening recommendations/referrals: Colonoscopy excluded, over age 81 Mammogram excluded, over age 80 Bone Density excluded Recommended yearly ophthalmology/optometry visit for glaucoma screening and checkup Recommended yearly dental visit for hygiene and checkup  Vaccinations: Influenza vaccine due, will receive at [facility] Pneumococcal vaccine 23 due, ordered Tdap vaccine due, ordered Shingles vaccine not in past records    Advanced directives: in chart  Conditions/risks identified: none  Next appointment: Dr. Alwyn Ren makes rounds   Preventive Care 65 Years and Older, Female Preventive care refers to lifestyle choices and visits with your health care provider that can promote health and wellness. What does preventive care include?  A yearly physical exam. This is also called an annual well check.  Dental exams once or twice a year.  Routine eye exams. Ask your health care provider how often you should have your eyes checked.  Personal lifestyle choices, including:  Daily care of your teeth and gums.  Regular physical activity.  Eating a healthy diet.  Avoiding tobacco and drug use.  Limiting alcohol use.  Practicing safe sex.  Taking low-dose aspirin every day.  Taking vitamin and mineral supplements as recommended by your health care provider. What happens during an annual well check? The services and screenings done by your health care provider during your annual well check will depend on your age, overall health, lifestyle risk factors, and family history of disease. Counseling  Your health care provider may ask you questions about your:  Alcohol use.  Tobacco use.  Drug use.  Emotional well-being.  Home and  relationship well-being.  Sexual activity.  Eating habits.  History of falls.  Memory and ability to understand (cognition).  Work and work Astronomer.  Reproductive health. Screening  You may have the following tests or measurements:  Height, weight, and BMI.  Blood pressure.  Lipid and cholesterol levels. These may be checked every 5 years, or more frequently if you are over 40 years old.  Skin check.  Lung cancer screening. You may have this screening every year starting at age 44 if you have a 30-pack-year history of smoking and currently smoke or have quit within the past 15 years.  Fecal occult blood test (FOBT) of the stool. You may have this test every year starting at age 8.  Flexible sigmoidoscopy or colonoscopy. You may have a sigmoidoscopy every 5 years or a colonoscopy every 10 years starting at age 55.  Hepatitis C blood test.  Hepatitis B blood test.  Sexually transmitted disease (STD) testing.  Diabetes screening. This is done by checking your blood sugar (glucose) after you have not eaten for a while (fasting). You may have this done every 1-3 years.  Bone density scan. This is done to screen for osteoporosis. You may have this done starting at age 54.  Mammogram. This may be done every 1-2 years. Talk to your health care provider about how often you should have regular mammograms. Talk with your health care provider about your test results, treatment options, and if necessary, the need for more tests. Vaccines  Your health care provider may recommend certain vaccines, such as:  Influenza vaccine. This is recommended every year.  Tetanus, diphtheria, and acellular pertussis (Tdap, Td) vaccine. You may need a Td booster  every 10 years.  Zoster vaccine. You may need this after age 15.  Pneumococcal 13-valent conjugate (PCV13) vaccine. One dose is recommended after age 105.  Pneumococcal polysaccharide (PPSV23) vaccine. One dose is recommended after  age 44. Talk to your health care provider about which screenings and vaccines you need and how often you need them. This information is not intended to replace advice given to you by your health care provider. Make sure you discuss any questions you have with your health care provider. Document Released: 09/25/2015 Document Revised: 05/18/2016 Document Reviewed: 06/30/2015 Elsevier Interactive Patient Education  2017 [place] Prevention in the Home Falls can cause injuries. They can happen to people of all ages. There are many things you can do to make your home safe and to help prevent falls. What can I do on the outside of my home?  Regularly fix the edges of walkways and driveways and fix any cracks.  Remove anything that might make you trip as you walk through a door, such as a raised step or threshold.  Trim any bushes or trees on the path to your home.  Use bright outdoor lighting.  Clear any walking paths of anything that might make someone trip, such as rocks or tools.  Regularly check to see if handrails are loose or broken. Make sure that both sides of any steps have handrails.  Any raised decks and porches should have guardrails on the edges.  Have any leaves, snow, or ice cleared regularly.  Use sand or salt on walking paths during winter.  Clean up any spills in your garage right away. This includes oil or grease spills. What can I do in the bathroom?  Use night lights.  Install grab bars by the toilet and in the tub and shower. Do not use towel bars as grab bars.  Use non-skid mats or decals in the tub or shower.  If you need to sit down in the shower, use a plastic, non-slip stool.  Keep the floor dry. Clean up any water that spills on the floor as soon as it happens.  Remove soap buildup in the tub or shower regularly.  Attach bath mats securely with double-sided non-slip rug tape.  Do not have throw rugs and other things on the floor that can  make you trip. What can I do in the bedroom?  Use night lights.  Make sure that you have a light by your bed that is easy to reach.  Do not use any sheets or blankets that are too big for your bed. They should not hang down onto the floor.  Have a firm chair that has side arms. You can use this for support while you get dressed.  Do not have throw rugs and other things on the floor that can make you trip. What can I do in the kitchen?  Clean up any spills right away.  Avoid walking on wet floors.  Keep items that you use a lot in easy-to-reach places.  If you need to reach something above you, use a strong step stool that has a grab bar.  Keep electrical cords out of the way.  Do not use floor polish or wax that makes floors slippery. If you must use wax, use non-skid floor wax.  Do not have throw rugs and other things on the floor that can make you trip. What can I do with my stairs?  Do not leave any items on the stairs.  Make  sure that there are handrails on both sides of the stairs and use them. Fix handrails that are broken or loose. Make sure that handrails are as long as the stairways.  Check any carpeting to make sure that it is firmly attached to the stairs. Fix any carpet that is loose or worn.  Avoid having throw rugs at the top or bottom of the stairs. If you do have throw rugs, attach them to the floor with carpet tape.  Make sure that you have a light switch at the top of the stairs and the bottom of the stairs. If you do not have them, ask someone to add them for you. What else can I do to help prevent falls?  Wear shoes that:  Do not have high heels.  Have rubber bottoms.  Are comfortable and fit you well.  Are closed at the toe. Do not wear sandals.  If you use a stepladder:  Make sure that it is fully opened. Do not climb a closed stepladder.  Make sure that both sides of the stepladder are locked into place.  Ask someone to hold it for you,  if possible.  Clearly mark and make sure that you can see:  Any grab bars or handrails.  First and last steps.  Where the edge of each step is.  Use tools that help you move around (mobility aids) if they are needed. These include:  Canes.  Walkers.  Scooters.  Crutches.  Turn on the lights when you go into a dark area. Replace any light bulbs as soon as they burn out.  Set up your furniture so you have a clear path. Avoid moving your furniture around.  If any of your floors are uneven, fix them.  If there are any pets around you, be aware of where they are.  Review your medicines with your doctor. Some medicines can make you feel dizzy. This can increase your chance of falling. Ask your doctor what other things that you can do to help prevent falls. This information is not intended to replace advice given to you by your health care provider. Make sure you discuss any questions you have with your health care provider. Document Released: 06/25/2009 Document Revised: 02/04/2016 Document Reviewed: 10/03/2014 Elsevier Interactive Patient Education  2017 [employer].

## 2018-06-28 NOTE — Progress Notes (Addendum)
Subjective:   Leah GRIEVES is a 83 y.o. female who presents for Medicare Annual (Subsequent) preventive examination at [facility]  Last AWV-07/05/2017       Objective:     Vitals: BP 130/75 (BP Location: Left Arm, Patient Position: Sitting)   Pulse 62   Temp 98.2 F (36.8 C) (Oral)   Ht 5\' 4"  (1.626 m)   Wt 181 lb (82.1 kg)   BMI 31.07 kg/m   Body mass index is 31.07 kg/m.  Advanced Directives 06/28/2018 06/22/2018 06/05/2018 05/31/2018 05/01/2018 03/28/2018 03/01/2018  Does Patient Have a Medical Advance Directive? Yes Yes Yes Yes Yes Yes Yes  Type of Advance Directive Out of facility DNR (pink MOST or yellow form) Out of facility DNR (pink MOST or yellow form) Out of facility DNR (pink MOST or yellow form) Out of facility DNR (pink MOST or yellow form) Out of facility DNR (pink MOST or yellow form) Out of facility DNR (pink MOST or yellow form) Out of facility DNR (pink MOST or yellow form)  Does patient want to make changes to medical advance directive? No - Patient declined No - Patient declined No - Patient declined No - Patient declined No - Patient declined No - Patient declined No - Patient declined  Copy of Healthcare Power of Attorney in Chart? - - - - - - -  Pre-existing out of facility DNR order (yellow form or pink MOST form) Yellow form placed in chart (order not valid for inpatient use) - - - - - -    Tobacco Social History   Tobacco Use  Smoking Status Never Smoker  Smokeless Tobacco Never Used     Counseling given: Not Answered   Clinical Intake:  Pre-visit preparation completed: No  Pain : No/denies pain     Diabetes: Yes CBG done?: No Did pt. bring in CBG monitor from home?: No  How often do you need to have someone help you when you read instructions, pamphlets, or other written materials from your doctor or pharmacy?: 2 - Rarely What is the last grade level you completed in school?: High School  Interpreter Needed?:  No  Information entered by :: Tyron Russell, RN  Past Medical History:  Diagnosis Date  . AF (atrial fibrillation) (HCC)   . Ankle fracture, right 1972  . Cellulitis of foot, right   . Diabetic retinopathy   . HTN (hypertension)   . Hyperlipidemia   . Low back pain   . Morbid obesity (HCC)   . Osteoporosis   . Parkinson disease (HCC)   . Type 2 diabetes mellitus (HCC) 1972  . Vitamin B12 deficiency    Past Surgical History:  Procedure Laterality Date  . CHOLECYSTECTOMY  1985  . PACEMAKER INSERTION  around 2010 per pt daughter   [employer]   Family History  Problem Relation Age of Onset  . Cancer Mother   . Heart attack Father   . Cancer Brother    Social History   Socioeconomic History  . Marital status: Married    Spouse name: Not on file  . Number of children: 3  . Years of education: 4  . Highest education level: Not on file  Occupational History  . Occupation: Retired  Engineer, production  . Financial resource strain: Not hard at all  . Food insecurity:    Worry: Never true    Inability: Never true  . Transportation needs:    Medical: No    Non-medical: No  Tobacco Use  . Smoking status: Never Smoker  . Smokeless tobacco: Never Used  Substance and Sexual Activity  . Alcohol use: No  . Drug use: No  . Sexual activity: Not on file  Lifestyle  . Physical activity:    Days per week: 0 days    Minutes per session: 0 min  . Stress: Not at all  Relationships  . Social connections:    Talks on phone: Three times a week    Gets together: Three times a week    Attends religious service: Never    Active member of club or organization: No    Attends meetings of clubs or organizations: Never    Relationship status: Married  Other Topics Concern  . Not on file  Social History Narrative   Full-time resident of [facility].   Caffeine use: Drinks 1 soda/week   Drinks water mostly    Coffee daily    Outpatient Encounter Medications as  of 06/28/2018  Medication Sig  . atorvastatin (LIPITOR) 20 MG tablet Take 20 mg by mouth daily at 6 PM.   . carbidopa-levodopa (SINEMET IR) 25-100 MG tablet Take 2 tablets by mouth 3 (three) times daily.  . Cholecalciferol (VITAMIN D-3) 1000 units CAPS Take 2,000 Units by mouth daily.   . cyanocobalamin 2000 MCG tablet Take 2,000 mcg by mouth daily.   . digoxin (LANOXIN) 0.125 MG tablet Take one tablet by mouth daily except for Wednesday and Sunday.  . escitalopram (LEXAPRO) 10 MG tablet Take 15 mg by mouth daily. Take 1-1/2 tablets qd to = 15 mg qd  . fish oil-omega-3 fatty acids 1000 MG capsule Take 2 g by mouth at bedtime.   Marland Kitchen HYDROcodone-acetaminophen (NORCO/VICODIN) 5-325 MG tablet Take 1 tablet by mouth 2 (two) times daily as needed for moderate pain.  . Insulin Glargine (LANTUS SOLOSTAR [place]) Inject 18 Units into the skin at bedtime.   . insulin lispro (HUMALOG KWIKPEN) 100 UNIT/ML KiwkPen Inject 5-10 Units into the skin daily. SSI:  200-300 give 5 units, 301-400 give 10 units, >400 notify provider  . L-Methylfolate-B6-B12 (FOLTANX PO) Take 1 tablet by mouth every evening.   . metoprolol tartrate (LOPRESSOR) 50 MG tablet Take 50 mg by mouth 2 (two) times daily. Hold for systolic BP <100  . Multiple Vitamins-Minerals (CENTRUM SILVER 50+WOMEN) TABS Take 1 tablet by mouth daily.   Marland Kitchen NUTRITIONAL SUPPLEMENT LIQD Take 120 mLs by mouth 2 (two) times daily. NSA MedPass  . POWDERS EX Apply 1 application topically 2 (two) times daily. Microgard powder to perineum and abdominal folds  . warfarin (COUMADIN) 2.5 MG tablet Take 2.5 mg by mouth See admin instructions. Take Tue-Thu-Sat  . warfarin (COUMADIN) 5 MG tablet Take 5 mg by mouth See admin instructions. Take M-W-F-Sunday   No facility-administered encounter medications on file as of 06/28/2018.     Activities of Daily Living In your present state of health, do you have any difficulty performing the following activities: 06/28/2018 11/30/2017   Hearing? N N  Vision? N N  Difficulty concentrating or making decisions? N N  Walking or climbing stairs? Y Y  Dressing or bathing? Y Y  Doing errands, shopping? Malvin Johns  Preparing Food and eating ? Y -  Using the Toilet? Y -  In the past six months, have you accidently leaked urine? Y -  Do you have problems with loss of bowel control? Y -  Managing your Medications? Y -  Managing your Finances? Y -  Housekeeping or managing your Housekeeping? Y -  Some recent data might be hidden    Patient Care Team: Pecola Lawless, MD as PCP - General (Internal Medicine) Medina-Vargas, Margit Banda, NP as Nurse Practitioner (Internal Medicine)    Assessment:   This is a routine wellness examination for Dama.  Exercise Activities and Dietary recommendations Current Exercise Habits: The patient does not participate in regular exercise at present, Exercise limited by: orthopedic condition(s)  Goals   None     Fall Risk Fall Risk  06/28/2018 06/27/2017  Falls in the past year? No No   Is the patient's home free of loose throw rugs in walkways, pet beds, electrical cords, etc?   yes      Grab bars in the bathroom? yes      Handrails on the stairs?   yes      Adequate lighting?   yes  Depression Screen PHQ 2/9 Scores 06/28/2018  PHQ - 2 Score 0     Cognitive Function     6CIT Screen 06/28/2018  What Year? 0 points  What month? 0 points  What time? 0 points  Count back from 20 0 points  Months in reverse 2 points  Repeat phrase 4 points  Total Score 6    Immunization History  Administered Date(s) Administered  . Influenza-Unspecified 04/12/2017  . Pneumococcal Conjugate-13 09/12/2014  . Pneumococcal-Unspecified 09/12/2014    Qualifies for Shingles Vaccine? Not in past records  Screening Tests Health Maintenance  Topic Date Due  . PNA vac Low Risk Adult (2 of 2 - PCV13) 09/13/2015  . INFLUENZA VACCINE  04/12/2018  . TETANUS/TDAP  12/20/2018 (Originally 10/16/1953)  .  FOOT EXAM  03/02/2019 (Originally 10/15/1944)  . OPHTHALMOLOGY EXAM  03/02/2019 (Originally 10/15/1944)  . HEMOGLOBIN A1C  12/05/2018  . URINE MICROALBUMIN  01/27/2019  . DEXA SCAN  Discontinued    Cancer Screenings: Lung: Low Dose CT Chest recommended if Age 19-80 years, 30 pack-year currently smoking OR have quit w/in 15years. Patient does not qualify. Breast:  Up to date on Mammogram? excluded  Up to date of Bone Density/Dexa? excluded Colorectal: excluded  Additional Screenings:  Hepatitis C Screening: declined Flu vaccine due: will receive at [facility] Pneumovax due: ordered TDAP due: ordered Diabetic eye exam due: ordered    Plan:    I have personally reviewed and addressed the Medicare Annual Wellness questionnaire and have noted the following in the patient's chart:  A. Medical and social history B. Use of alcohol, tobacco or illicit drugs  C. Current medications and supplements D. Functional ability and status E.  Nutritional status F.  Physical activity G. Advance directives H. List of other physicians I.  Hospitalizations, surgeries, and ER visits in previous 12 months J.  Vitals K. Screenings to include hearing, vision, cognitive, depression L. Referrals and appointments - none  In addition, I have reviewed and discussed with patient certain preventive protocols, quality metrics, and best practice recommendations. A written personalized care plan for preventive services as well as general preventive health recommendations were provided to patient.  See attached scanned questionnaire for additional information.   Signed,   Tyron Russell, RN Nurse Health Advisor  Patient Concerns: None I have personally reviewed the health advisor's clinical note, was available for consultation, and agree with the assessment and plan as written. Pecola Lawless M.D., FACP, [facility]

## 2018-07-09 LAB — POCT INR: INR: 3 — AB (ref 0.9–1.1)

## 2018-07-09 LAB — PROTIME-INR: Protime: 30.8 — AB (ref 10.0–13.8)

## 2018-07-16 ENCOUNTER — Telehealth: Payer: Self-pay

## 2018-07-16 ENCOUNTER — Encounter: Payer: Medicare Other | Admitting: *Deleted

## 2018-07-16 NOTE — Telephone Encounter (Signed)
LMOVM reminding pt to send remote transmission.   

## 2018-07-23 ENCOUNTER — Encounter: Payer: Self-pay | Admitting: Cardiology

## 2018-07-23 LAB — POCT INR: INR: 1.9 — AB (ref 0.9–1.1)

## 2018-07-23 LAB — PROTIME-INR: PROTIME: 21.1 — AB (ref 10.0–13.8)

## 2018-07-24 ENCOUNTER — Encounter: Payer: Self-pay | Admitting: Adult Health

## 2018-07-24 ENCOUNTER — Non-Acute Institutional Stay (SKILLED_NURSING_FACILITY): Payer: Medicare Other | Admitting: Adult Health

## 2018-07-24 DIAGNOSIS — Z794 Long term (current) use of insulin: Secondary | ICD-10-CM

## 2018-07-24 DIAGNOSIS — G20 Parkinson's disease: Secondary | ICD-10-CM | POA: Diagnosis not present

## 2018-07-24 DIAGNOSIS — I4892 Unspecified atrial flutter: Secondary | ICD-10-CM | POA: Diagnosis not present

## 2018-07-24 DIAGNOSIS — N183 Chronic kidney disease, stage 3 unspecified: Secondary | ICD-10-CM

## 2018-07-24 DIAGNOSIS — E1122 Type 2 diabetes mellitus with diabetic chronic kidney disease: Secondary | ICD-10-CM

## 2018-07-24 DIAGNOSIS — I10 Essential (primary) hypertension: Secondary | ICD-10-CM | POA: Diagnosis not present

## 2018-07-24 DIAGNOSIS — E782 Mixed hyperlipidemia: Secondary | ICD-10-CM | POA: Diagnosis not present

## 2018-07-24 DIAGNOSIS — M170 Bilateral primary osteoarthritis of knee: Secondary | ICD-10-CM

## 2018-07-24 NOTE — Progress Notes (Signed)
Location:  [facility] Room Number: 110-B Place of Service:  SNF (31) Provider:  Kenard Gower, NP  Patient Care Team: Pecola Lawless, MD as PCP - General (Internal Medicine) Gillis Santa, NP as Nurse Practitioner (Internal Medicine)  Extended Emergency Contact Information Primary Emergency Contact: Lemont Fillers of [place] Home Phone: [phone] Work Phone: [phone] Mobile Phone: [phone] Relation: Daughter Secondary Emergency Contact: Sleep,Russell Home Phone: [phone] Relation: Son  Code Status:  DNR  Goals of care: Advanced Directive information Advanced Directives 07/24/2018  Does Patient Have a Medical Advance Directive? Yes  Type of Advance Directive Out of facility DNR (pink MOST or yellow form)  Does patient want to make changes to medical advance directive? No - Patient declined  Copy of Healthcare Power of Attorney in Chart? -  Pre-existing out of facility DNR order (yellow form or pink MOST form) -     Chief Complaint  Patient presents with  . Medical Management of Chronic Issues    Routine [facility] visit    HPI:  Pt is an 83 y.o. female seen today for medical management of chronic diseases.  She is a long-term care resident of [facility]. She has a PMH of atrial fibrillation/flutter status post permanent pacemaker, diastolic congestive heart failure, dyslipidemia, hypertension, DM2, and Parkinson's disease. She was seen in her room today. When asked if she still sees dead people in her room, she said that she sees her parents and grandparents in the room.    Past Medical History:  Diagnosis Date  . AF (atrial fibrillation) (HCC)   . Ankle fracture, right 1972  . Cellulitis of foot, right   . Diabetic retinopathy   . HTN (hypertension)   . Hyperlipidemia   . Low back pain   . Morbid obesity (HCC)   . Osteoporosis   . Parkinson disease (HCC)   .  Type 2 diabetes mellitus (HCC) 1972  . Vitamin B12 deficiency    Past Surgical History:  Procedure Laterality Date  . CHOLECYSTECTOMY  1985  . PACEMAKER INSERTION  around 2010 per pt daughter   [employer]    Allergies  Allergen Reactions  . Penicillins Hives and Itching    Has patient had a PCN reaction causing immediate rash, facial/tongue/throat swelling, SOB or lightheadedness with hypotension: Yes Has patient had a PCN reaction causing severe rash involving mucus membranes or skin necrosis: No Has patient had a PCN reaction that required hospitalization: No Has patient had a PCN reaction occurring within the last 10 years: No If all of the above answers are "NO", then may proceed with Cephalosporin use.  . Diphenoxylate-Atropine Other (See Comments)    Unknown reaction  . Latex Itching and Rash    Outpatient Encounter Medications as of 07/24/2018  Medication Sig  . atorvastatin (LIPITOR) 20 MG tablet Take 20 mg by mouth daily at 6 PM.   . carbidopa-levodopa (SINEMET IR) 25-100 MG tablet Take 2 tablets by mouth 3 (three) times daily.  . cetaphil (CETAPHIL) cream Apply 1 application topically 2 (two) times daily. Apply to bilateral feet for dry skin.  . Cholecalciferol (VITAMIN D-3) 1000 units CAPS Take 2,000 Units by mouth daily.   . cyanocobalamin 2000 MCG tablet Take 2,000 mcg by mouth daily.   . diclofenac sodium (VOLTAREN) 1 % GEL Apply 4 g topically 2 (two) times daily. Apply to bilateral knees.  . digoxin (LANOXIN) 0.125 MG tablet Take one tablet by mouth daily except for  Wednesday and Sunday.  . escitalopram (LEXAPRO) 10 MG tablet Take 15 mg by mouth daily. Take 1-1/2 tablets qd to = 15 mg qd  . fish oil-omega-3 fatty acids 1000 MG capsule Take 2 g by mouth at bedtime.   Marland Kitchen HYDROcodone-acetaminophen (NORCO/VICODIN) 5-325 MG tablet Take 1 tablet by mouth 2 (two) times daily as needed for moderate pain.  . Insulin Glargine (LANTUS SOLOSTAR [place]) Inject 18 Units into the skin at  bedtime.   . insulin lispro (HUMALOG KWIKPEN) 100 UNIT/ML KiwkPen Inject 5-10 Units into the skin daily. SSI:  200-300 give 5 units, 301-400 give 10 units, >400 notify provider  . L-Methylfolate-B6-B12 (FOLTANX PO) Take 1 tablet by mouth every evening.   . metoprolol tartrate (LOPRESSOR) 50 MG tablet Take 50 mg by mouth 2 (two) times daily. Hold for systolic BP <100  . Multiple Vitamins-Minerals (CENTRUM SILVER 50+WOMEN) TABS Take 1 tablet by mouth daily.   Marland Kitchen NUTRITIONAL SUPPLEMENT LIQD Take 120 mLs by mouth 2 (two) times daily. NSA MedPass  . POWDERS EX Apply 1 application topically 2 (two) times daily. Microgard powder to perineum and abdominal folds  . warfarin (COUMADIN) 2.5 MG tablet Take 2.5 mg by mouth See admin instructions. Take Tue-Thu-Sat  . warfarin (COUMADIN) 5 MG tablet Take 5 mg by mouth See admin instructions. Take M-W-F-Sunday   No facility-administered encounter medications on file as of 07/24/2018.     Review of Systems  GENERAL: No change in appetite, no fatigue, no weight changes, no fever, chills or weakness MOUTH and THROAT: Denies oral discomfort, gingival pain or bleeding RESPIRATORY: no cough, SOB, DOE, wheezing, hemoptysis CARDIAC: No chest pain, edema or palpitations GI: No abdominal pain, diarrhea, constipation, heart burn, nausea or vomiting GU: Denies dysuria, frequency, hematuria, or discharge PSYCHIATRIC: +hallucinations   Immunization History  Administered Date(s) Administered  . Influenza-Unspecified 04/12/2017, 07/13/2018  . Pneumococcal Conjugate-13 09/12/2014  . Pneumococcal-Unspecified 09/12/2014   Pertinent  Health Maintenance Due  Topic Date Due  . PNA vac Low Risk Adult (2 of 2 - PCV13) 09/13/2015  . FOOT EXAM  03/02/2019 (Originally 10/15/1944)  . OPHTHALMOLOGY EXAM  03/02/2019 (Originally 10/15/1944)  . HEMOGLOBIN A1C  12/05/2018  . URINE MICROALBUMIN  01/27/2019  . INFLUENZA VACCINE  Completed  . DEXA SCAN  Discontinued   Fall Risk   06/28/2018 06/27/2017  Falls in the past year? No No     Vitals:   07/24/18 0908  BP: 125/62  Pulse: 60  Resp: 18  Temp: 99.4 F (37.4 C)  TempSrc: Oral  SpO2: 95%  Weight: 181 lb (82.1 kg)  Height: 5\' 4"  (1.626 m)   Body mass index is 31.07 kg/m.  Physical Exam  GENERAL APPEARANCE: Well nourished. In no acute distress. Obese SKIN:  Skin is warm and dry.  MOUTH and THROAT: Lips are without lesions. Oral mucosa is moist and without lesions. Tongue is normal in shape, size, and color and without lesions RESPIRATORY: Breathing is even & unlabored, BS CTAB CARDIAC: RRR, no murmur,no extra heart sounds, no edema, right chest pacemaker GI: Abdomen soft, normal BS, no masses, no tenderness EXTREMITIES:  BLE with generalized weakness NEUROLOGICAL:  Speech is clear. Alert and oriented X 3. PSYCHIATRIC:  Affect and behavior are appropriate  Labs reviewed: Recent Labs    12/01/17 0458 12/02/17 0721 12/04/17 0733 12/20/17 06/01/18  NA 143 141 140 141 141  K 4.3 4.4 4.1 3.8 3.9  CL 115* 112* 111  --   --   CO2 19*  17* 19*  --   --   GLUCOSE 44* 118* 117*  --   --   BUN 48* 37* 29* 24* 31*  CREATININE 2.00* 1.63* 1.49* 1.0 1.0  CALCIUM 8.3* 8.3* 8.5*  --   --    Recent Labs    11/30/17 1800 12/20/17 06/01/18  AST 26 24 23   ALT 5* 9 5*  ALKPHOS 87 103 103  BILITOT 0.4  --   --   PROT 6.9  --   --   ALBUMIN 2.8*  --   --    Recent Labs    11/30/17 1800  12/02/17 0721 12/03/17 0631 12/04/17 0733 06/01/18  WBC 16.5*   < > 14.5* 14.2* 13.5* 11.3  NEUTROABS 12.1*  --   --   --   --  7  HGB 11.6*   < > 10.9* 10.6* 10.7* 11.5*  HCT 37.4   < > 35.1* 33.3* 34.1* 35*  MCV 88.8   < > 87.8 87.6 87.4  --   PLT 371   < > 265 279 264 322   < > = values in this interval not displayed.   Lab Results  Component Value Date   TSH 2.184 Test methodology is 3rd generation TSH 07/31/2009   Lab Results  Component Value Date   HGBA1C 9.0 06/06/2018   Lab Results  Component  Value Date   CHOL 89 06/01/2018   HDL 40 06/01/2018   LDLCALC 38 06/01/2018   TRIG 58 06/01/2018    Assessment/Plan  1. Parkinson disease (HCC) -stable, continues to hallucinate about dead people in her room, continue carbidopa-levodopa 25-100 mg 2 tabs 3 times a day t   2. Atrial flutter, unspecified type (HCC) -well-controlled, continue digoxin 0.125 mg 1 tab daily except Wednesday and Sunday, Coumadin 2.5 mg 1 tab daily Tuesdays, Thursdays, and Sundays: Coumadin 5 mg 1 tab q. Mondays, Wednesdays, Fridays, and Saturdays, metoprolol tartrate 50 mg 1 tab twice a day   3. HYPERLIPIDEMIA-MIXED -continue atorvastatin 20 mg 1 tab q. evening Lab Results  Component Value Date   CHOL 89 06/01/2018   HDL 40 06/01/2018   LDLCALC 38 06/01/2018   TRIG 58 06/01/2018    4. Essential hypertension -BPs are stable, 146/64, 152/79, 125/62, 138/74, continue metoprolol tartrate 50 mg 1 tab twice a day   5. Type 2 diabetes mellitus with stage 3 chronic kidney disease, with long-term current use of insulin (HCC) -CBGs 133, 221, 107, 354, 166, 164, stable, continue Lantus 100 units/mL inject 18 units subcutaneously nightly and Humalog 100 units/mL sliding scale at 4:30 PM, CBG daily Lab Results  Component Value Date   HGBA1C 9.0 06/06/2018    6. Primary osteoarthritis of both knees -stable, continue diclofenac sodium 1% gel 4 g topically to bilateral knees twice daily and Norco 5-325 mg 1 tab twice a day as needed    Family/ staff Communication: Discussed plan of care with resident.  Labs/tests ordered:  None  Goals of care:   Long-term care.   Kenard Gower, NP [facility] [phone] (Monday-Friday 8:00 a.m. - 5:00 p.m.) [phone] (after hours)

## 2018-07-30 LAB — PROTIME-INR: Protime: 20 — AB (ref 10.0–13.8)

## 2018-07-30 LAB — POCT INR: INR: 1.7 — AB (ref 0.9–1.1)

## 2018-08-27 LAB — POCT INR: INR: 1.6 — AB (ref 0.9–1.1)

## 2018-08-27 LAB — PROTIME-INR: PROTIME: 18.6 — AB (ref 10.0–13.8)

## 2018-08-28 ENCOUNTER — Telehealth: Payer: Self-pay | Admitting: Internal Medicine

## 2018-08-28 NOTE — Telephone Encounter (Signed)
° ° °  Call from Rehab center stating patient is bedridden and does not leave the facility

## 2018-09-13 ENCOUNTER — Non-Acute Institutional Stay (SKILLED_NURSING_FACILITY): Payer: Medicare Other | Admitting: Internal Medicine

## 2018-09-13 DIAGNOSIS — I10 Essential (primary) hypertension: Secondary | ICD-10-CM | POA: Diagnosis not present

## 2018-09-13 DIAGNOSIS — N189 Chronic kidney disease, unspecified: Secondary | ICD-10-CM

## 2018-09-13 DIAGNOSIS — E782 Mixed hyperlipidemia: Secondary | ICD-10-CM

## 2018-09-13 DIAGNOSIS — I5032 Chronic diastolic (congestive) heart failure: Secondary | ICD-10-CM | POA: Diagnosis not present

## 2018-09-13 DIAGNOSIS — E0821 Diabetes mellitus due to underlying condition with diabetic nephropathy: Secondary | ICD-10-CM | POA: Diagnosis not present

## 2018-09-13 DIAGNOSIS — Z794 Long term (current) use of insulin: Secondary | ICD-10-CM

## 2018-09-13 NOTE — Assessment & Plan Note (Signed)
Lipids are current and at goal No change in statin therapy

## 2018-09-13 NOTE — Assessment & Plan Note (Signed)
Update A1c and BMET  If A1c remains above 8, the sliding scale before the evening meal will need to be adjusted or NPH twice daily could be considered in place of the glargine

## 2018-09-13 NOTE — Progress Notes (Signed)
NURSING HOME LOCATION:  [facility] ROOM NUMBER:  110-B  CODE STATUS:  DNR  PCP:  Pecola LawlessHopper, Marvelene Stoneberg F, MD  [address] [place] [id number]  This is a nursing facility follow up of chronic medical diagnoses.  Interim medical record and care since last [facility] visit was updated with review of diagnostic studies and change in clinical status since last visit were documented.  HPI: She is a permanent resident of the facility with diagnoses of chronic diastolic heart failure, history of A. fib/a flutter, dyslipidemia, hypertension, insulin-dependent diabetes, and Parkinson's with hallucinations. Glucoses at the facility have been markedly labile and variable.  Values include fasting glucoses as low as 69 to a high of 264 this morning.  The latter value is an outlier.  The vast majority of fasting glucoses are 200 or less.  Glucoses related to the evening meal have been as low as 150 and high as 303.  A1c was 9.0%.  She is on 18 units of long-acting insulin at bedtime and a sliding scale before meals.  Review of systems: She describes urination "every 2 to 3 minutes" for the past 5 years.  She denies excessive thirst or excessive hunger.  Despite PMH history she is oriented today. Her major complaint is pain in her knees with extension into the lower legs.  Constitutional: No fever, significant weight change, fatigue  Eyes: No redness, discharge, pain, vision change ENT/mouth: No nasal congestion,  purulent discharge, earache, change in hearing, sore throat  Cardiovascular: No chest pain, palpitations, paroxysmal nocturnal dyspnea, claudication, edema  Respiratory: No cough, sputum production, hemoptysis, DOE, significant snoring, apnea   Gastrointestinal: No heartburn, dysphagia, abdominal pain, nausea /vomiting, rectal bleeding, melena, change in bowels Genitourinary: No dysuria, hematuria, pyuria Dermatologic: No rash, pruritus, change in appearance of  skin Neurologic: No dizziness, headache, syncope, seizures, numbness, tingling Psychiatric: No significant anxiety, depression, insomnia, anorexia Endocrine: No change in hair/skin/nails Hematologic/lymphatic: No significant bruising, lymphadenopathy, abnormal bleeding Allergy/immunology: No itchy/watery eyes, significant sneezing, urticaria, angioedema  Physical exam:  Pertinent or positive findings: Parkinsonian characteristics present with masklike facies and fine tremor of the left hand.Obese.  Pupils are small.  She has multiple missing teeth especially the mandible. Grade 1 systolic murmur is present.  Second heart sound is increased.  Pacemaker is present over the right upper chest.  Breath sounds are decreased.  Abdomen is protuberant.  Scarring and resolving ecchymoses over the forearms.  Large eschar present over the left ankle.  Stasis dermatitis changes are present over the right ankle. The pedal pulses are decreased.  Her knees are massive.  She has minimal spontaneous muscular activity.  There is essentially no movement of the lower extremities.  Upper extremities are also weak but not as pronounced.  General appearance: no acute distress, increased work of breathing is present.   Lymphatic: No lymphadenopathy about the head, neck, axilla. Eyes: No conjunctival inflammation or lid edema is present. There is no scleral icterus. Ears:  External ear exam shows no significant lesions or deformities.   Nose:  External nasal examination shows no deformity or inflammation. Nasal mucosa are pink and moist without lesions, exudates Oral exam:  Lips and gums are healthy appearing. There is no oropharyngeal erythema or exudate. Neck:  No thyromegaly, masses, tenderness noted.    Heart:  Normal rate and regular rhythm. S1 normal without gallop, click, rub .  Lungs:  without wheezes, rhonchi, rales, rubs. Abdomen: Bowel sounds are normal. Abdomen is soft and  nontender with no organomegaly,  hernias, masses. GU: Deferred  Extremities:  No cyanosis, clubbing, edema  Neurologic exam : Balance, Rhomberg, finger to nose testing could not be completed due to clinical state Skin: Warm & dry w/o tenting. No significant  rash.  See summary under each active problem in the Problem List with associated updated therapeutic plan

## 2018-09-13 NOTE — Assessment & Plan Note (Addendum)
Creatinine had improved to 1.0 and had been stable BMET will be updated

## 2018-09-15 ENCOUNTER — Encounter: Payer: Self-pay | Admitting: Internal Medicine

## 2018-09-15 NOTE — Assessment & Plan Note (Signed)
Beers' List 2019 recommendations state the digoxin is not preferred for treating atrial fibrillation or heart failure in the elderly. Discuss with Dr. Ladona Ridgel

## 2018-09-15 NOTE — Patient Instructions (Signed)
See assessment and plan under each diagnosis in the problem list and acutely for this visit 

## 2018-09-15 NOTE — Assessment & Plan Note (Signed)
Blood pressure will be monitored and antihypertensives adjusted based on the average BP

## 2018-10-09 ENCOUNTER — Telehealth: Payer: Self-pay

## 2018-10-09 NOTE — Telephone Encounter (Signed)
-----   Message from Marinus Maw, MD sent at 09/28/2018  9:32 PM EST ----- Ok to stop digoxin. She is pending a visit in March. GT ----- Message ----- From: Pecola Lawless, MD Sent: 09/15/2018   4:47 PM EST To: Marinus Maw, MD  Patient is at [facility]; please schedule follow up for pacemaker if needed. Please Fax appointment information to [facility]  ( [phone]/ attention Gavin Pound) so facility can arrange transport that day. Beers' List 2019 recommendations state the digoxin is not preferred for treating atrial fibrillation or heart failure in the elderly. Is it OK to D/C it? Thanks, [employer]

## 2018-10-09 NOTE — Telephone Encounter (Signed)
Appt made

## 2018-11-13 ENCOUNTER — Encounter: Payer: Medicare Other | Admitting: Internal Medicine

## 2018-11-14 ENCOUNTER — Encounter: Payer: Self-pay | Admitting: Internal Medicine

## 2018-12-12 ENCOUNTER — Encounter: Payer: Self-pay | Admitting: Internal Medicine

## 2018-12-12 ENCOUNTER — Non-Acute Institutional Stay (SKILLED_NURSING_FACILITY): Payer: Medicare Other | Admitting: Internal Medicine

## 2018-12-12 DIAGNOSIS — L601 Onycholysis: Secondary | ICD-10-CM | POA: Diagnosis not present

## 2018-12-12 DIAGNOSIS — I4892 Unspecified atrial flutter: Secondary | ICD-10-CM | POA: Diagnosis not present

## 2018-12-12 LAB — LIPID PANEL
Cholesterol: 80 (ref 0–200)
HDL: 35 (ref 35–70)
LDL Cholesterol: 26
Triglycerides: 95 (ref 40–160)

## 2018-12-12 LAB — BASIC METABOLIC PANEL
BUN: 48 — AB (ref 4–21)
Creatinine: 1.3 — AB (ref 0.5–1.1)
Glucose: 148
Potassium: 4.3 (ref 3.4–5.3)
Sodium: 140 (ref 137–147)

## 2018-12-12 LAB — CBC AND DIFFERENTIAL
HCT: 38 (ref 36–46)
Hemoglobin: 12.3 (ref 12.0–16.0)
Platelets: 340 (ref 150–399)
WBC: 14

## 2018-12-12 LAB — VITAMIN B12: Vitamin B-12: >2000

## 2018-12-12 LAB — HEPATIC FUNCTION PANEL
ALT: 6 — AB (ref 7–35)
AST: 28 (ref 13–35)

## 2018-12-12 LAB — MICROALBUMIN, URINE: Microalb, Ur: 68.7

## 2018-12-12 LAB — HEMOGLOBIN A1C: Hemoglobin A1C: 7.7

## 2018-12-12 NOTE — Patient Instructions (Signed)
See assessment and plan under each diagnosis acutely for this visit  

## 2018-12-12 NOTE — Progress Notes (Signed)
   NURSING HOME LOCATION:  [facility] ROOM NUMBER:  110-B CODE STATUS:    PCP:  Jeannie Done ,NP/W Minerva Ends MD  This is a nursing facility follow up for specific acute issue of left finger nail deformity.  Interim medical record and care since last [facility] visit was updated with review of diagnostic studies and change in clinical status since last visit were documented.  HPI: Staff requested evaluation of left thumbnail changes.  It was described as "hanging off, thick and ingrown".  According to the patient these changes have been present 2-3 weeks.  There has been no associated pain, purulence, or discharge.  When asked if she wished to be seen in the podiatry clinic; her concern was cost.  We also discussed the risk of stopping Eliquis for any invasive procedure in context of history of paroxysmal atrial flutter/atrial fib. There is no PMH of DVT or PTE.  Review of systems:Constitutional: No fever, significant weight change, fatigue  Musculoskeletal: No associated joint stiffness, joint swelling, weakness, pain Dermatologic: No rash, pruritus, change in appearance of skin Endocrine: No change in hair/skin   Physical exam:  Pertinent or positive findings: Patient appears chronically ill; she is obese.  Dental hygiene is poor with multiple missing teeth.  Heart rhythm is slow with a grade 1 systolic murmur.  Breath sounds are decreased with low-grade rhonchi and rales without increased work of breathing.  Abdomen is protuberant.  Her feet are in protective booties.  The left ankle/foot is dressed.  Dorsalis pedis pulses are surprisingly palpable.  She has bruising and scarring over the forearms.  The left thumbnail is a avulsed but has underpinning by thick keratin.  There is no erythema, cellulitis, or purulence present.  There are similar but less severe changes of the toenails as well.  General appearance:  no acute distress, increased work of breathing is present.    Eyes: No conjunctival inflammation or lid edema is present. There is no scleral icterus. Oral exam:  Lips  are healthy appearing.  Heart:  No gallop, click, rub .  Abdomen: Bowel sounds are normal. Abdomen is soft and nontender with no organomegaly, hernias, masses. GU: Deferred  Extremities:  No cyanosis, clubbing Skin: Warm & dry w/o tenting. No significant rash.  See summary under each active problem in the Problem List with associated updated therapeutic plan

## 2018-12-13 LAB — COMPLETE METABOLIC PANEL WITH GFR
Albumin: 2.8
Calcium: 8.8
Carbon Dioxide, Total: 24
Chloride: 105
Total Protein: 6.2 — AB (ref 6.4–8.2)

## 2018-12-13 LAB — MICROALBUMIN / CREATININE URINE RATIO
Creatinine, Ur: 43 mg/dL
MICROALB/CREAT RATIO: 1580

## 2018-12-13 LAB — PHOSPHORUS: Phosphorus: 4.4

## 2018-12-13 LAB — PARATHYROID HORMONE, INTACT (NO CA): PTH: 29.77

## 2018-12-13 LAB — VITAMIN D 25 HYDROXY (VIT D DEFICIENCY, FRACTURES): Vitamin D, 25-Hydroxy: 18.11

## 2018-12-15 ENCOUNTER — Encounter: Payer: Self-pay | Admitting: Internal Medicine

## 2019-03-20 ENCOUNTER — Encounter: Payer: Self-pay | Admitting: Internal Medicine

## 2019-03-20 ENCOUNTER — Non-Acute Institutional Stay (SKILLED_NURSING_FACILITY): Payer: Medicare Other | Admitting: Internal Medicine

## 2019-03-20 DIAGNOSIS — N189 Chronic kidney disease, unspecified: Secondary | ICD-10-CM | POA: Diagnosis not present

## 2019-03-20 DIAGNOSIS — E0821 Diabetes mellitus due to underlying condition with diabetic nephropathy: Secondary | ICD-10-CM | POA: Diagnosis not present

## 2019-03-20 DIAGNOSIS — I10 Essential (primary) hypertension: Secondary | ICD-10-CM | POA: Diagnosis not present

## 2019-03-20 DIAGNOSIS — Z794 Long term (current) use of insulin: Secondary | ICD-10-CM

## 2019-03-20 DIAGNOSIS — E782 Mixed hyperlipidemia: Secondary | ICD-10-CM | POA: Diagnosis not present

## 2019-03-20 DIAGNOSIS — I480 Paroxysmal atrial fibrillation: Secondary | ICD-10-CM

## 2019-03-20 NOTE — Progress Notes (Signed)
NURSING HOME LOCATION:  [facility] ROOM NUMBER:  110-B  CODE STATUS:  DNR  PCP:  Hendricks Limes, MD  [place] [address]   This is a nursing facility follow up of chronic medical diagnoses.   Interim medical record and care since last [facility] visit was updated with review of diagnostic studies and change in clinical status since last visit were documented.  HPI: She is a permanent resident of the facility with medical diagnoses of dyslipidemia, Parkinson's, vitamin D deficiency, depression, insulin-dependent diabetes, osteoporosis, A. fib, and peripheral neuropathy.  Surgeries and procedures include pacemaker insertion and cholecystectomy.  Family history is noncontributory as she is 49.  Not surprisingly, she has never smoked.  She does not drink.  Labs were performed 12/12/2018.  She had mild creatinine elevation at 1.3; there was prerenal azotemia with a BUN of 48.  Lipids were amazingly good, specifically LDL was 26.  B12 level was supratherapeutic at over 2000.  Vitamin D level was low at 18.  A1c indicated adequate control at 7.7%.  Review of systems: She states that she has had a new sore on her foot for 2 days.  She states that the wound care team has seen her and the pain is decreasing.  Wound care nurse was consulted; she states that this is a chronic issue that is not new.   She describes an occasional headache which can last up to an hour.  She states that she is "not quite as bad" in reference to nausea, but "a long way from being well".  She states that she has "an occasional flare of a little bit of constipation and diarrhea".  She denies other GI symptoms.  Constitutional: No fever, significant weight change, fatigue  Eyes: No redness, discharge, pain, vision change ENT/mouth: No nasal congestion,  purulent discharge, earache, change in hearing, sore throat  Cardiovascular: No chest pain, palpitations, paroxysmal nocturnal  dyspnea, claudication Respiratory: No cough, sputum production, hemoptysis, DOE, significant snoring, apnea   Gastrointestinal: No heartburn, dysphagia, abdominal pain, vomiting, rectal bleeding, melena Genitourinary: No dysuria, hematuria, pyuria, incontinence, nocturia Musculoskeletal: No joint stiffness, joint swelling, weakness, pain Dermatologic: No new rash, pruritus Neurologic: No dizziness, syncope, seizures, numbness, tingling Psychiatric: No significant anxiety, depression, insomnia, anorexia Endocrine: No change in hair/skin/nails, excessive thirst, excessive hunger, excessive urination  Hematologic/lymphatic: No significant bruising, lymphadenopathy, abnormal bleeding Allergy/immunology: No itchy/watery eyes, significant sneezing, urticaria, angioedema  Physical exam:  Pertinent or positive findings: She is morbidly obese and appears chronically deconditioned.  She is bedridden.  Hair is fine and disheveled.  She has a whining character to her voice and exhibits facial grimacing as she responds.  Eyebrows are essentially nonexistent.  Ptosis is greater on the left than the right.  She has multiple missing teeth.  There is asymmetry of the chest wall; the right thorax seems higher than the left.  Pacer is present on the right.  Rhythm is slow and regular.  Abdomen is massively protuberant but nontender.  Fusiform changes are present of the knees.  Pedal pulses are decreased.  The right foot is dressed and in a soft ,protective boot.  She is diffusely weak to opposition, especially the left upper extremity.  Clubbing of the nailbeds is noted.  She has an elongated, discolored right thumbnail and a severely deformed left thumbnail.  General appearance: no acute distress, increased work of breathing is present.   Lymphatic: No lymphadenopathy about the head, neck, axilla. Eyes: No  conjunctival inflammation or lid edema is present. There is no scleral icterus. Ears:  External ear exam shows  no significant lesions or deformities.   Nose:  External nasal examination shows no deformity or inflammation. Nasal mucosa are pink and moist without lesions, exudates Neck:  No thyromegaly, masses, tenderness noted.    Heart:  No gallop, murmur, click, rub .  Lungs:  without wheezes, rhonchi, rales, rubs. Abdomen: Bowel sounds are normal. Abdomen is soft and nontender with no organomegaly, hernias, masses. GU: Deferred  Extremities:  No cyanosis, clubbing, edema  Neurologic exam : Balance, Rhomberg, finger to nose testing could not be completed due to clinical state Skin: Warm & dry w/o tenting. No significant rash.  See summary under each active problem in the Problem List with associated updated therapeutic plan

## 2019-03-20 NOTE — Assessment & Plan Note (Signed)
12/12/2018 LDL was extremely low at 26 on low-dose atorvastatin.  No change indicated

## 2019-03-20 NOTE — Assessment & Plan Note (Signed)
12/12/2018 A1c 7.7% indicating adequate control in the context of advanced age and multiple comorbidities.  Her A1c goal is less than 8%; major goal is to avoid hypoglycemia.

## 2019-03-20 NOTE — Assessment & Plan Note (Signed)
BP controlled; no change in antihypertensive medications  

## 2019-03-20 NOTE — Assessment & Plan Note (Addendum)
Continue on prophylactic Eliquis.  Pacer in place

## 2019-03-20 NOTE — Assessment & Plan Note (Signed)
12/12/2018 creatinine 1.3.  Prerenal azotemia with a BUN of 48.  Renal function will need to be verified once the corona viral quarantine is lifted.

## 2019-03-22 ENCOUNTER — Encounter: Payer: Self-pay | Admitting: Internal Medicine

## 2019-03-22 NOTE — Patient Instructions (Addendum)
See assessment and plan under each diagnosis in the problem list and acutely for this visit Duplicative multivitamins and supplements will be discontinued as her B12 level was supratherapeutic.  Vitamin D needs to be continued as the level was low.

## 2019-06-18 ENCOUNTER — Non-Acute Institutional Stay (SKILLED_NURSING_FACILITY): Payer: Medicare Other | Admitting: Internal Medicine

## 2019-06-18 ENCOUNTER — Encounter: Payer: Self-pay | Admitting: Internal Medicine

## 2019-06-18 DIAGNOSIS — E782 Mixed hyperlipidemia: Secondary | ICD-10-CM | POA: Diagnosis not present

## 2019-06-18 DIAGNOSIS — I10 Essential (primary) hypertension: Secondary | ICD-10-CM

## 2019-06-18 DIAGNOSIS — I480 Paroxysmal atrial fibrillation: Secondary | ICD-10-CM | POA: Diagnosis not present

## 2019-06-18 DIAGNOSIS — N189 Chronic kidney disease, unspecified: Secondary | ICD-10-CM

## 2019-06-18 DIAGNOSIS — E0821 Diabetes mellitus due to underlying condition with diabetic nephropathy: Secondary | ICD-10-CM

## 2019-06-18 DIAGNOSIS — Z794 Long term (current) use of insulin: Secondary | ICD-10-CM

## 2019-06-18 NOTE — Assessment & Plan Note (Signed)
FBS 139-212;evening glucoses 132-286 with the 286 being an outlier.  Serial glucoses indicate adequate control.  No change in present regimen.

## 2019-06-18 NOTE — Assessment & Plan Note (Signed)
As statin is low-dose and there are no symptoms to suggest rhabdo; no change will be made.  Lipids will be rechecked in April 2021.

## 2019-06-18 NOTE — Assessment & Plan Note (Signed)
BP controlled; no change in antihypertensive medications  

## 2019-06-18 NOTE — Assessment & Plan Note (Addendum)
No nephrotoxic drugs on med list.  Renal function will be updated along with A1c when corona restrictions lifted.

## 2019-06-18 NOTE — Progress Notes (Signed)
NURSING HOME LOCATION:  [facility] ROOM NUMBER:  [id number]  CODE STATUS: DNR  PCP: Pascal Lux, MD  This is a nursing facility follow up of chronic medical diagnoses  Interim medical record and care since last [place] visit was updated with review of diagnostic studies and change in clinical status since last visit were documented.  HPI: She is a permanent resident of the facility with medical diagnoses of B12 deficiency, insulin-dependent diabetes, Parkinson's disease, osteoporosis, dyslipidemia, essential hypertension, and atrial fibrillation. Significant surgeries include cholecystectomy and pacemaker insertion. Family history is strongly positive for cancer.  Family history is really noncontributory as she is 84 years old. She has never drunk alcohol or smoked.  Labs were last collected 12/12/2018.  She exhibited mild renal insufficiency with a BUN of 48 and creatinine of 1.3.  LDL was at goal at 26.  B12 level was over 2000.  Vitamin D level was 18 with desired levels greater than 30.  A1c was 7.7% indicating adequate control.[facility] NP decreased digoxin dose as level was 1.82.  Review of systems:She stated the date was the last week in September 2020.  She knew Trump was president.  She hoped that Biden would not be elected as "he touches too close and cannot help himself"(?). Her major concern is pain in the legs which "hurt all over".  She states that it will hurt even upon awakening.  The oral pain medicines and topical agents do help.  She states that she is exercising using her arms and legs "up and down & all around".  She states that she has some dysuria intermittently which resolves without treatment.    Constitutional: No fever, significant weight change, fatigue  Eyes: No redness, discharge, pain, vision change ENT/mouth: No nasal congestion,  purulent discharge, earache, change in hearing, sore throat  Cardiovascular: No chest pain, palpitations, paroxysmal  nocturnal dyspnea, claudication, edema  Respiratory: No cough, sputum production, hemoptysis, DOE, significant snoring, apnea   Gastrointestinal: No heartburn, dysphagia, abdominal pain, nausea /vomiting, rectal bleeding, melena, change in bowels Genitourinary: No  hematuria, pyuria Dermatologic: No rash, pruritus, change in appearance of skin Neurologic: No dizziness, headache, syncope, seizures, numbness, tingling Psychiatric: No significant anxiety, depression, insomnia, anorexia Endocrine: No change in hair/skin/nails, excessive thirst, excessive hunger, excessive urination  Hematologic/lymphatic: No significant bruising, lymphadenopathy, abnormal bleeding Allergy/immunology: No itchy/watery eyes, significant sneezing, urticaria, angioedema  Physical exam:  Pertinent or positive findings: She has multiple missing teeth.  Facies are weathered.  She has mild rales which are low-grade & in a scattered distribution.  There is a slight gallop cadence to the heart rhythm.  Pedal pulses are decreased.  Lower extremities are in booties.  Toenails are markedly deformed.  She has contractures of the hands/fingers.  She has isolated fingernail deformities.  She has an intermittent resting tremor of the left hand.  Fusiform changes of the knees are present.  General appearance: Adequately nourished; no acute distress, increased work of breathing is present.   Lymphatic: No lymphadenopathy about the head, neck, axilla. Eyes: No conjunctival inflammation or lid edema is present. There is no scleral icterus. Ears:  External ear exam shows no significant lesions or deformities.   Nose:  External nasal examination shows no deformity or inflammation. Nasal mucosa are pink and moist without lesions, exudates Neck:  No thyromegaly, masses, tenderness noted.    Heart:  No murmur, click, rub .  Lungs:  without wheezes, rhonchi, rubs. Abdomen: Bowel sounds are normal. Abdomen is soft  and nontender with no  organomegaly, hernias, masses. GU: Deferred  Extremities:  No cyanosis, clubbing, edema  Neurologic exam : Balance, Rhomberg, finger to nose testing could not be completed due to clinical state Skin: Warm & dry w/o tenting. No significant rash.  See summary under each active problem in the Problem List with associated updated therapeutic plan

## 2019-06-18 NOTE — Assessment & Plan Note (Addendum)
Rate slightly elevated, she is on lifelong Eliquis prophylaxis.

## 2019-06-18 NOTE — Progress Notes (Signed)
   NURSING HOME LOCATION:  [facility] ROOM NUMBER:  110-B  CODE STATUS:  DNR  PCP:  Hendricks Limes, MD [place] [address]   This is a nursing facility follow up of chronic medical diagnoses.   Interim medical record and care since last [place] visit was updated with review of diagnostic studies and change in clinical status since last visit were documented.  HPI:  Review of systems: Dementia invalidated responses. Date given as   Constitutional: No fever, significant weight change, fatigue  Eyes: No redness, discharge, pain, vision change ENT/mouth: No nasal congestion,  purulent discharge, earache, change in hearing, sore throat  Cardiovascular: No chest pain, palpitations, paroxysmal nocturnal dyspnea, claudication, edema  Respiratory: No cough, sputum production, hemoptysis, DOE, significant snoring, apnea   Gastrointestinal: No heartburn, dysphagia, abdominal pain, nausea /vomiting, rectal bleeding, melena, change in bowels Genitourinary: No dysuria, hematuria, pyuria, incontinence, nocturia Musculoskeletal: No joint stiffness, joint swelling, weakness, pain Dermatologic: No rash, pruritus, change in appearance of skin Neurologic: No dizziness, headache, syncope, seizures, numbness, tingling Psychiatric: No significant anxiety, depression, insomnia, anorexia Endocrine: No change in hair/skin/nails, excessive thirst, excessive hunger, excessive urination  Hematologic/lymphatic: No significant bruising, lymphadenopathy, abnormal bleeding Allergy/immunology: No itchy/watery eyes, significant sneezing, urticaria, angioedema  Physical exam:  Pertinent or positive findings: General appearance: Adequately nourished; no acute distress, increased work of breathing is present.   Lymphatic: No lymphadenopathy about the head, neck, axilla. Eyes: No conjunctival inflammation or lid edema is present. There is no scleral icterus. Ears:   External ear exam shows no significant lesions or deformities.   Nose:  External nasal examination shows no deformity or inflammation. Nasal mucosa are pink and moist without lesions, exudates Oral exam:  Lips and gums are healthy appearing. There is no oropharyngeal erythema or exudate. Neck:  No thyromegaly, masses, tenderness noted.    Heart:  Normal rate and regular rhythm. S1 and S2 normal without gallop, murmur, click, rub .  Lungs: Chest clear to auscultation without wheezes, rhonchi, rales, rubs. Abdomen: Bowel sounds are normal. Abdomen is soft and nontender with no organomegaly, hernias, masses. GU: Deferred  Extremities:  No cyanosis, clubbing, edema  Neurologic exam : Cn 2-7 intact Strength equal  in upper & lower extremities Balance, Rhomberg, finger to nose testing could not be completed due to clinical state Deep tendon reflexes are equal Skin: Warm & dry w/o tenting. No significant lesions or rash.  See summary under each active problem in the Problem List with associated updated therapeutic plan

## 2019-06-18 NOTE — Patient Instructions (Signed)
See assessment and plan under each diagnosis in the problem list and acutely for this visit 

## 2019-09-17 ENCOUNTER — Encounter: Payer: Self-pay | Admitting: Internal Medicine

## 2019-09-17 ENCOUNTER — Non-Acute Institutional Stay (SKILLED_NURSING_FACILITY): Payer: Medicare Other | Admitting: Internal Medicine

## 2019-09-17 DIAGNOSIS — R4189 Other symptoms and signs involving cognitive functions and awareness: Secondary | ICD-10-CM | POA: Diagnosis not present

## 2019-09-17 DIAGNOSIS — Z794 Long term (current) use of insulin: Secondary | ICD-10-CM

## 2019-09-17 DIAGNOSIS — E0821 Diabetes mellitus due to underlying condition with diabetic nephropathy: Secondary | ICD-10-CM | POA: Diagnosis not present

## 2019-09-17 DIAGNOSIS — L03032 Cellulitis of left toe: Secondary | ICD-10-CM | POA: Diagnosis not present

## 2019-09-17 DIAGNOSIS — I10 Essential (primary) hypertension: Secondary | ICD-10-CM

## 2019-09-17 NOTE — Assessment & Plan Note (Signed)
Status post full course of Keflex for cellulitis of the second left toe. Wound care nurse continues to follow.

## 2019-09-17 NOTE — Assessment & Plan Note (Signed)
Mental testing results have been markedly variable and may reflect hallucinatory activity related to her Parkinson's.  Such was suggested by the interview 09/17/2019.

## 2019-09-17 NOTE — Assessment & Plan Note (Addendum)
Relative fasting hypoglycemic recordings necessitated decreasing Lantus dose.  Subsequent glucose monitor indicates adequate control.

## 2019-09-17 NOTE — Progress Notes (Signed)
   NURSING HOME LOCATION:  [facility] ROOM NUMBER:  110-B  CODE STATUS:   DNR  PCP:  Pecola Lawless, MD  [address] [place] [address]   This is a nursing facility follow up of chronic medical diagnoses.  Interim medical record and care since last [facility] visit was updated with review of diagnostic studies and change in clinical status since last visit were documented.  HPI: She is a permanent resident of the facility with medical diagnoses of B12 deficiency, insulin-dependent diabetes, Parkinson's disease, osteoporosis, morbid obesity, dyslipidemia, essential hypertension, and chronic diastolic heart failure in the setting of paroxysmal atrial fib/flutter. Patient has a permanent pacemaker.  Review of systems: Lantus has had to be decreased due to fasting hypoglycemia.  Recent glucoses reveal fasting values of 86-137 and evening glucoses of 116-377.  The 377 value is an outlier. Oral intake has been decreasing and the patient is clinically declining.   Recently she had cellulitis of the left second toe and received a course of Keflex.   Toprol has been titrated for blood pressure control. She confabulates with nonsensical answers.  She gave the date as August 20, 1917.  When I ask how she was doing in general she made some comment about "toe hanging on for the time".  The Wound Care nurse states that the cellulitis has improved.   She stated she had chest pain yesterday but did not elaborate.   She states that she is "over diarrhea".   Physical exam:  Pertinent or positive findings: She appears chronically ill.  Hair is thin.  She is missing multiple teeth.  Pacemaker is present over the right upper chest.  Heart rhythm is slow but regular. Inspirations are shallow but chest is clear.  Abdomen is protuberant.  Pedal pulses are decreased.  She has fusiform changes of the knees.  There are flexion changes of the left fingers.  There is a fine tremor of  the left hand.  She has hyperpigmentation over the shins in an irregular distribution.  There is scarring of the forearms with bruising more so on the left forearm.  General appearance: no acute distress, increased work of breathing is present.   Lymphatic: No lymphadenopathy about the head, neck, axilla. Eyes: No conjunctival inflammation or lid edema is present. There is no scleral icterus. Ears:  External ear exam shows no significant lesions or deformities.   Nose:  External nasal examination shows no deformity or inflammation. Nasal mucosa are pink and moist without lesions, exudates Neck:  No thyromegaly, masses, tenderness noted.    Heart:  No gallop, murmur, click, rub .  Lungs:  without wheezes, rhonchi, rales, rubs. Abdomen: Bowel sounds are normal. Abdomen is soft and nontender with no organomegaly, hernias, masses. GU: Deferred  Extremities:  No cyanosis, clubbing, edema  Neurologic exam : Balance, Rhomberg, finger to nose testing could not be completed due to clinical state Skin: Warm & dry w/o tenting. No significant rash.  See summary under each active problem in the Problem List with associated updated therapeutic plan

## 2019-09-17 NOTE — Patient Instructions (Signed)
See assessment and plan under each diagnosis in the problem list and acutely for this visit 

## 2019-09-17 NOTE — Assessment & Plan Note (Signed)
BP controlled; no change in antihypertensive medications  

## 2019-09-27 LAB — LIPID PANEL
Cholesterol: 129 (ref 0–200)
HDL: 31 — AB (ref 35–70)
LDL Cholesterol: 67
LDl/HDL Ratio: 4.2
Triglycerides: 155 (ref 40–160)

## 2019-09-27 LAB — HEMOGLOBIN A1C: Hemoglobin A1C: 6.7

## 2019-09-27 LAB — CBC: RBC: 5.09 (ref 3.87–5.11)

## 2019-09-27 LAB — VITAMIN D 25 HYDROXY (VIT D DEFICIENCY, FRACTURES): Vit D, 25-Hydroxy: 19.55

## 2019-09-27 LAB — CBC AND DIFFERENTIAL
HCT: 42 (ref 36–46)
Hemoglobin: 13.8 (ref 12.0–16.0)
Platelets: 223 (ref 150–399)
WBC: 9.3

## 2019-11-25 ENCOUNTER — Telehealth: Payer: Self-pay | Admitting: Internal Medicine

## 2019-11-25 ENCOUNTER — Ambulatory Visit (INDEPENDENT_AMBULATORY_CARE_PROVIDER_SITE_OTHER): Payer: Medicare Other | Admitting: *Deleted

## 2019-11-25 DIAGNOSIS — I480 Paroxysmal atrial fibrillation: Secondary | ICD-10-CM

## 2019-11-25 LAB — CUP PACEART REMOTE DEVICE CHECK
Battery Impedance: 1973 Ohm
Battery Remaining Longevity: 31 mo
Battery Voltage: 2.74 V
Brady Statistic AP VP Percent: 36 %
Brady Statistic AP VS Percent: 0 %
Brady Statistic AS VP Percent: 64 %
Brady Statistic AS VS Percent: 1 %
Date Time Interrogation Session: 20210313134430
Implantable Lead Implant Date: 20101119
Implantable Lead Implant Date: 20101119
Implantable Lead Location: 753859
Implantable Lead Location: 753860
Implantable Lead Model: 5076
Implantable Lead Model: 5076
Implantable Pulse Generator Implant Date: 20101119
Lead Channel Impedance Value: 452 Ohm
Lead Channel Impedance Value: 456 Ohm
Lead Channel Pacing Threshold Amplitude: 0.75 V
Lead Channel Pacing Threshold Amplitude: 0.875 V
Lead Channel Pacing Threshold Pulse Width: 0.4 ms
Lead Channel Pacing Threshold Pulse Width: 0.4 ms
Lead Channel Setting Pacing Amplitude: 2 V
Lead Channel Setting Pacing Amplitude: 2.5 V
Lead Channel Setting Pacing Pulse Width: 0.4 ms
Lead Channel Setting Sensing Sensitivity: 2 mV

## 2019-11-25 NOTE — Telephone Encounter (Signed)
New message:    Patient grand-daughter calling stating that the patient did a transmission on Saturday and would to know if it was received. Please call patient grand daughter.

## 2019-11-25 NOTE — Telephone Encounter (Signed)
Caller is not on pt DPR.  Verified transmission has been received, no other info provided.

## 2019-11-26 ENCOUNTER — Non-Acute Institutional Stay (SKILLED_NURSING_FACILITY): Payer: Medicare Other | Admitting: Internal Medicine

## 2019-11-26 ENCOUNTER — Encounter: Payer: Self-pay | Admitting: Internal Medicine

## 2019-11-26 DIAGNOSIS — E0821 Diabetes mellitus due to underlying condition with diabetic nephropathy: Secondary | ICD-10-CM | POA: Diagnosis not present

## 2019-11-26 DIAGNOSIS — Z794 Long term (current) use of insulin: Secondary | ICD-10-CM

## 2019-11-26 DIAGNOSIS — I480 Paroxysmal atrial fibrillation: Secondary | ICD-10-CM

## 2019-11-26 DIAGNOSIS — Z950 Presence of cardiac pacemaker: Secondary | ICD-10-CM

## 2019-11-26 DIAGNOSIS — R4189 Other symptoms and signs involving cognitive functions and awareness: Secondary | ICD-10-CM

## 2019-11-26 DIAGNOSIS — E782 Mixed hyperlipidemia: Secondary | ICD-10-CM

## 2019-11-26 NOTE — Assessment & Plan Note (Signed)
Adequate diabetic control with A1c of 7.7%

## 2019-11-26 NOTE — Assessment & Plan Note (Addendum)
Tachycardia seems regular.  EKG had revealed normal sinus rhythm with occasional PVCs with pauses.  It has not responded to adjustment in beta-blocker and calcium channel blocker therapy.  Digoxin was discontinued as the level was over 4 on 3/12.  Pacer interrogation unremarkable.  Remains on prophylactic Eliquis. Cardiology follow-up with Dr. Ladona Ridgel is pending. D/C SSRI (remote risk Serotonin Syndrome) if tachy fails to resolve off digoxin.

## 2019-11-26 NOTE — Progress Notes (Signed)
   NURSING HOME LOCATION:  [facility] ROOM NUMBER:  110 B  CODE STATUS:  DNR  PCP:  Douglass Rivers MD  This is a nursing facility follow up for specific acute issue of tachycardia.  Interim medical record and care since last [facility] visit was updated with review of diagnostic studies and change in clinical status since last visit were documented.  HPI: [facility] NP has documented tachycardia ; has adjusted B blocker dosage; added rate limiting CCB;and has ordered multiple labs and EKG.  Chemistries and electrolytes were normal except for creatinine of 1.3, BUN of 48, total protein 6.2, and glucose of 148.  Thyroid function tests are current and therapeutic. EKG had revealed premature beats with pauses with demonstration of pacemaker spikes. Pacemaker interrogation was accomplished 3/15 and revealed normal function. Digoxin was discontinued as her dig level was over 4 on 3/12.  Magnesium and TFTs were normal.  A1c indicated adequate control with a value of 7.7%.  Lipids revealed an LDL of 26 and triglycerides of 95 in absence of statin.  Vitamin D level was low at 18.  B12 level was supratherapeutic. She is on Eliquis 2.5 mg twice a day.  Review of systems: Dementia invalidated responses. Date given as October 2021 but the president as "poor man".  Subsequently she said she was the president. She told the NP student that she had had abdominal pain yesterday.  When I interviewed her she did not mention this but stated she had some chest pain "until I got my pill".  She confabulated about my visiting her today unexpectedly.   Physical exam:  Pertinent or positive findings: She appears chronically ill.  Hair is disheveled.  Her facies exhibit somewhat of a "grimace" pattern.  She has multiple missing teeth, especially of the mandible.  She has mild low-grade rhonchi anteriorly.  Tachycardia is present with pulse greater than 120.  Abdomen is protuberant.  Pedal pulses are decreased.  She  has scattered ecchymoses and scarring of the extremities.  There is marked laxity of the subcutaneous tissues of the upper extremities.  Fusiform enlargement the knees are present.  She has flexion contractures of the MCP joints and flexion contractures of the toes.  General appearance: no acute distress, increased work of breathing is present.   Lymphatic: No lymphadenopathy about the head, neck, axilla. Eyes: No conjunctival inflammation or lid edema is present. There is no scleral icterus. Ears:  External ear exam shows no significant lesions or deformities.   Nose:  External nasal examination shows no deformity or inflammation. Nasal mucosa are pink and moist without lesions, exudates Oral exam:  Lips and gums are healthy appearing. There is no oropharyngeal erythema or exudate. Neck:  No thyromegaly, masses, tenderness noted.    Heart:  No gallop, murmur, click, rub .  Lungs:  without wheezes,  rales, rubs. Abdomen: Bowel sounds are normal. Abdomen is soft and nontender with no organomegaly, hernias, masses. GU: Deferred  Extremities:  No cyanosis, clubbing, edema  Neurologic exam : Balance, Rhomberg, finger to nose testing could not be completed due to clinical state Skin: Warm & dry w/o tenting. No significant  rash.  See summary under each active problem in the Problem List with associated updated therapeutic plan

## 2019-11-26 NOTE — Assessment & Plan Note (Addendum)
Patient recheck performed remotely 3/15.  No issues identified despite the tachycardia.

## 2019-11-26 NOTE — Progress Notes (Signed)
PPM Remote  

## 2019-11-26 NOTE — Assessment & Plan Note (Addendum)
11/26/2019 she gave the date as October 2021 & identified the president as "poor man".  Subsequently she stated she was the president.  She was confabulating about my visit today.

## 2019-11-26 NOTE — Assessment & Plan Note (Signed)
No statin is listed on her med list yet LDL was 26.  This will be verified with the [facility] NP.

## 2019-11-27 ENCOUNTER — Encounter: Payer: Self-pay | Admitting: Internal Medicine

## 2019-11-27 NOTE — Patient Instructions (Signed)
See assessment and plan under each diagnosis in the problem list and acutely for this visit 

## 2019-12-02 ENCOUNTER — Other Ambulatory Visit: Payer: Self-pay

## 2019-12-02 ENCOUNTER — Telehealth (INDEPENDENT_AMBULATORY_CARE_PROVIDER_SITE_OTHER): Payer: Medicare Other | Admitting: Internal Medicine

## 2019-12-02 DIAGNOSIS — Z950 Presence of cardiac pacemaker: Secondary | ICD-10-CM | POA: Diagnosis not present

## 2019-12-02 DIAGNOSIS — I480 Paroxysmal atrial fibrillation: Secondary | ICD-10-CM

## 2019-12-02 DIAGNOSIS — I442 Atrioventricular block, complete: Secondary | ICD-10-CM

## 2019-12-02 NOTE — Progress Notes (Signed)
Electrophysiology TeleHealth Note   Due to national recommendations of social distancing due to COVID 19, an audio/video telehealth visit is felt to be most appropriate for this patient at this time.  See MyChart message from today for the patient's consent to telehealth for [facility].   Date:  12/02/2019   ID:  Leah [name], [name] 1935/06/03, MRN [id number]  Location: patient's home  Provider location: [address] [place]  Evaluation Performed: Follow-up visit  PCP:  Hendricks Limes, MD  Cardiologist:  No primary care provider on file.  Electrophysiologist:  Dr Lovena Le  Chief Complaint:  The patient is demented and history provided by the in facility NP   History of Present Illness:    Leah [name] is a 85 y.o. female who presents via audio/video conferencing for a telehealth visit today.  She has a h/o progressive dementia s/p PPM insertion remotely. She has been living in a SNF over the past years. She does not interact much. Her bp has been reasonably controlled. She has had PAF and her beta blocker has been increased. Interrogation of her PPM demonstrates normal PPM function. She developed digoxin toxicity and her dose was stopped.   Past Medical History:  Diagnosis Date  . AF (atrial fibrillation) ([place])   . Ankle fracture, right 1972  . Cellulitis of foot, right   . Diabetic retinopathy   . HTN (hypertension)   . Hyperlipidemia   . Low back pain   . Morbid obesity ([place])   . Osteoporosis   . Parkinson disease ([place])   . Type 2 diabetes mellitus ([place]) 1972  . Vitamin B12 deficiency     Past Surgical History:  Procedure Laterality Date  . CHOLECYSTECTOMY  1985  . PACEMAKER INSERTION  around 2010 per pt daughter   Medtronic    Current Outpatient Medications  Medication Sig Dispense Refill  . acetaminophen (TYLENOL) 325 MG tablet Take 650 mg by mouth every 6 (six) hours as needed for mild pain or fever.    Marland Kitchen apixaban (ELIQUIS) 2.5  MG TABS tablet Take 2.5 mg by mouth 2 (two) times daily.     . carbidopa-levodopa (SINEMET IR) 25-100 MG tablet Take 2 tablets by mouth 3 (three) times daily. 540 tablet 4  . cetaphil (CETAPHIL) cream Apply 1 application topically 2 (two) times daily. Apply to bilateral feet for dry skin.    . Cholecalciferol (DIALYVITE VITAMIN D3 MAX) 1.25 MG (50000 UT) TABS Take by mouth. 50,000 UNIT TAKE 1 TABLET BY MOUTH ONCE A MONTH (ON 28TH) FOR OSTEOPOROSIS    . Cholecalciferol (VITAMIN D-3) 1000 units CAPS Take 2,000 Units by mouth daily.     . cyanocobalamin 2000 MCG tablet Take 2,000 mcg by mouth daily.     . diclofenac sodium (VOLTAREN) 1 % GEL Apply 4 g topically 2 (two) times daily. Apply to bilateral knees.    Marland Kitchen diltiazem (CARDIZEM) 60 MG tablet Take 60 mg by mouth 3 (three) times daily. FOR AFIB    . escitalopram (LEXAPRO) 10 MG tablet Take 15 mg by mouth daily. Take 1-1/2 tablets qd to = 15 mg qd     . insulin glargine (LANTUS SOLOSTAR) 100 UNIT/ML Solostar Pen INJECT 5 UNITS SUBCUTANEOUSLY AT BEDTIME FOR DM (PRIME PEN WITH 2 UNITS PRIOR TO EACH USE - DO NOT MIX WITH ANY OTHER INSULINS USE WITHIN 28    . insulin lispro (HUMALOG KWIKPEN) 100 UNIT/ML KwikPen INSULIN LISPRO 100 UNIT/ML PEN FOR DM  CHECK FSBS ONCE DAILY AND INJECT PER SSI: 200-300=5 UNITS ; 301-400=10 UNITS ; >400=CALL PROVIDER (PRIME PEN WITH 2 UNITS PRIOR TO EACH USE    . Melatonin 3 MG TABS TAKE 2 TABLETS (6MG ) BY MOUTH AT BEDTIME FOR INSOMNIA    . metoprolol tartrate (LOPRESSOR) 100 MG tablet 100 mg. GIVE ONE TAB ALONG WITH 1/2 TAB METOPROLOL 25 MG = 112.5 MG. HOLD IF SBP<90    . metoprolol tartrate (LOPRESSOR) 50 MG tablet TAKE 1/2 TABLET (12.5MG ) BY MOUTH TWICE A DAY ALONG WITH 100MG  DOSE FOR TOTAL OF 112.5MG  (HOLD IF SBP <90)    . NON FORMULARY DIET Regular with chopped meats    . Nutritional Supplement LIQD Take 120 mLs by mouth 2 (two) times daily. MedPass     . POWDERS EX Apply 1 application topically 2 (two) times daily.  Microgard powder to perineum and abdominal folds     No current facility-administered medications for this visit.    Allergies:   Penicillins, Diphenoxylate-atropine, and Latex   Social History:  The patient  reports that she has never smoked. She has never used smokeless tobacco. She reports that she does not drink alcohol or use drugs.   Family History:  The patient's  family history includes Cancer in her brother and mother; Heart attack in her father.   ROS:  Please see the history of present illness.   All other systems are personally reviewed and negative.    Exam:    Vital Signs:  There were no vitals taken for this visit.  Well appearing, alert and conversant, regular work of breathing,  good skin color Eyes- anicteric, neuro- grossly intact, skin- no apparent rash or lesions or cyanosis, mouth- oral mucosa is pink   Labs/Other Tests and Data Reviewed:    Recent Labs: 12/12/2018: ALT 6; BUN 48; Creatinine 1.3; Hemoglobin 12.3; Platelets 340; Potassium 4.3; Sodium 140   Wt Readings from Last 3 Encounters:  11/26/19 155 lb (70.3 kg)  09/17/19 173 lb 12.8 oz (78.8 kg)  06/18/19 164 lb 12.8 oz (74.8 kg)     Other studies personally reviewed: Additional studies/ records that were reviewed today include:   Last device remote is reviewed from [employer] PDF dated 11/25/19 which reveals normal device function, no arrhythmias since February   ASSESSMENT & PLAN:    1.  PAF - she is back in NSR based on her PPM interrogation last week. She will continue her current meds. Would use beta blocker for rate control as needed. 2. PPM - her  medttronic DDD PPM is working normally and has about 2.5 years on the battery.  COVID 19 screen The patient denies symptoms of COVID 19 at this time.  The importance of social distancing was discussed today.  Follow-up:  prn Next remote: 6/21  Current medicines are reviewed at length with the patient today.   The patient does not have concerns  regarding her medicines.  The following changes were made today:  none  Labs/ tests ordered today include: none No orders of the defined types were placed in this encounter.    Patient Risk:  after full review of this patients clinical status, I feel that they are at moderate risk at this time.  Today, I have spent 15 minutes with the patient with telehealth technology discussing all of the above with March, NP .    Signed, 7/21, MD  12/02/2019 5:28 PM     [facility] [address] [place] [address] [phone] (office) [phone] (fax)

## 2019-12-02 NOTE — Patient Instructions (Signed)
Medication Instructions:  Your physician recommends that you continue on your current medications as directed. Please refer to the Current Medication list given to you today.  Labwork: None ordered.  Testing/Procedures: None ordered.  Follow-Up: Your physician wants you to follow-up in: one year with Dr. Ladona Ridgel (virtual only).   You will receive a reminder letter in the mail two months in advance. If you don't receive a letter, please call our office to schedule the follow-up appointment.  Remote monitoring is used to monitor your Pacemaker from home. This monitoring reduces the number of office visits required to check your device to one time per year. It allows [place] to keep an eye on the functioning of your device to ensure it is working properly. You are scheduled for a device check from home on 02/24/2020. You may send your transmission at any time that day. If you have a wireless device, the transmission will be sent automatically. After your physician reviews your transmission, you will receive a postcard with your next transmission date.  Any Other Special Instructions Will Be Listed Below (If Applicable).  If you need a refill on your cardiac medications before your next appointment, please call your pharmacy.

## 2019-12-24 ENCOUNTER — Non-Acute Institutional Stay (SKILLED_NURSING_FACILITY): Payer: Medicare Other | Admitting: Internal Medicine

## 2019-12-24 ENCOUNTER — Encounter: Payer: Self-pay | Admitting: Internal Medicine

## 2019-12-24 DIAGNOSIS — R9389 Abnormal findings on diagnostic imaging of other specified body structures: Secondary | ICD-10-CM

## 2019-12-24 DIAGNOSIS — M79661 Pain in right lower leg: Secondary | ICD-10-CM

## 2019-12-24 DIAGNOSIS — M79662 Pain in left lower leg: Secondary | ICD-10-CM

## 2019-12-24 NOTE — Patient Instructions (Signed)
See assessment and plan under each diagnosis in the problem list and acutely for this visit 

## 2019-12-24 NOTE — Progress Notes (Signed)
   NURSING HOME LOCATION:  [facility] ROOM NUMBER: [id number]   CODE STATUS:  DNR  PCP:  Pecola Lawless MD.  This is a nursing facility follow up for specific acute issue of abnormal chest x-ray.  Interim medical record and care since last [facility] visit was updated with review of diagnostic studies and change in clinical status since last visit were documented.  HPI: [facility] NP ordered a chest x-ray 4/10 because of dyspnea and nonproductive cough.  That chest x-ray was personally reviewed and revealed marked cardiomegaly with an LVH pattern and large left effusion.  Hilar vascular accentuation was also suggested without frank pulmonary edema.  This was compared with the last [facility] x-ray of 12/11/2016.  Cardiomegaly was present at that time to a lesser degree.  Left lower lobe atelectasis was present. Patient remains confused and apparently has been talking to her daughter about "heaven".  Supportive care has been elected by the family; therefore, no labs have been done to date. She has had persistent tachycardia and rate controlling CCB and beta-blocker have been titrated appropriately by [facility] NP. PO intake has deteriorated. She is on low-dose Eliquis for PAF.  She is also on high-dose metoprolol because of tachycardia.  Review of systems: Dementia invalidated responses. Date given as June 25, 2018??Marland Kitchen Family states that when she was seen 2 weeks ago she was much more alert and talkative.  She always is confused and will confabulate.  They did validate that she has had a nonproductive cough they have noted.   Physical exam:  Pertinent or positive findings: She appears chronically ill.  Bilateral ptosis is present.  Eyebrows are absent.  The left pupil is larger than the right.  Oral tissues are dry.  She is missing multiple teeth.  Pacer is present over the right upper chest.  Distant tachycardia is auscultated with difficulty.  Breath sounds are decreased at the bases.   Bronchovesicular breath sounds are suggested in the left anterior chest. She has fusiform changes of the knees.  There is marked atrophy of the gastrocnemius muscles.  There is marked tenderness to compression of the right calf compared to the left.  Pedal pulses are absent.  The feet are in booties.  The thumbnails are deformed.  Clubbing is present.  There are marked deformities of the hands with flexion contractures.  General appearance: no acute distress, increased work of breathing is present.   Lymphatic: No lymphadenopathy about the head, neck, axilla. Eyes: No conjunctival inflammation or lid edema is present. There is no scleral icterus. Ears:  External ear exam shows no significant lesions or deformities.   Nose:  External nasal examination shows no deformity or inflammation. Nasal mucosa are pink and moist without lesions, exudates Neck:  No thyromegaly, masses, tenderness noted.    Heart:  No murmur, click, rub .  Lungs:  without wheezes, rhonchi, rales, rubs. Abdomen: Bowel sounds are normal. Abdomen is soft and nontender with no organomegaly, hernias, masses. GU: Deferred  Extremities:  No cyanosis,  edema  Neurologic exam : Balance, Rhomberg, finger to nose testing could not be completed due to clinical state Skin: Warm & dry . No significant rash.  See summary under each active problem in the Problem List with associated updated therapeutic plan

## 2019-12-30 ENCOUNTER — Telehealth: Payer: Self-pay

## 2019-12-30 NOTE — Telephone Encounter (Signed)
A nurse practitioner from [facility] wanted [place] to turn the pt pacemaker off. I told her we do not turn the ppm off. I told her the pacemaker does not shock the pt. I let her know once the pumping function of the heart stop then the pt life will end. The pacemaker will not keep the pt alive.

## 2020-02-11 DEATH — deceased

## 2020-11-23 ENCOUNTER — Ambulatory Visit: Payer: Medicare Other

## 2020-11-23 DIAGNOSIS — I442 Atrioventricular block, complete: Secondary | ICD-10-CM

## 2020-12-02 NOTE — Progress Notes (Signed)
Remote pacemaker transmission.   

## 2021-01-28 ENCOUNTER — Telehealth: Payer: Self-pay | Admitting: *Deleted

## 2021-01-28 NOTE — Telephone Encounter (Signed)
Patients granddaughter was in to see Dr. Graciela Husbands on 01/20/21 and brought a bill for a remote check on her grandmother dated 2020-12-04.  Patient passed away on 13-Jan-2020.  Dr. Graciela Husbands asked management to look into this.  We were never notified of patients passing and therefore all her remote appointments were still showing as active.  All appointments have been removed and she has been marked deceased in both Epic and [employer]. Billing was contacted and all charges were removed.  I spoke with the granddaughter today and apologized for the mistake and let her all charges were dropped.  She was very thankful for the call.
